# Patient Record
Sex: Female | Born: 1940 | Race: Black or African American | Hispanic: No | Marital: Single | Smoking: Never smoker
Health system: Southern US, Community
[De-identification: ages and names within clinical notes are randomized; demographics above are authoritative.]

## PROBLEM LIST (undated history)

## (undated) DIAGNOSIS — M1990 Unspecified osteoarthritis, unspecified site: Secondary | ICD-10-CM

## (undated) DIAGNOSIS — I2510 Atherosclerotic heart disease of native coronary artery without angina pectoris: Secondary | ICD-10-CM

## (undated) DIAGNOSIS — I25118 Atherosclerotic heart disease of native coronary artery with other forms of angina pectoris: Secondary | ICD-10-CM

## (undated) DIAGNOSIS — F329 Major depressive disorder, single episode, unspecified: Secondary | ICD-10-CM

## (undated) DIAGNOSIS — R0602 Shortness of breath: Secondary | ICD-10-CM

## (undated) DIAGNOSIS — J45909 Unspecified asthma, uncomplicated: Secondary | ICD-10-CM

## (undated) DIAGNOSIS — J449 Chronic obstructive pulmonary disease, unspecified: Secondary | ICD-10-CM

## (undated) DIAGNOSIS — D649 Anemia, unspecified: Secondary | ICD-10-CM

## (undated) DIAGNOSIS — E119 Type 2 diabetes mellitus without complications: Secondary | ICD-10-CM

## (undated) DIAGNOSIS — R1013 Epigastric pain: Secondary | ICD-10-CM

## (undated) DIAGNOSIS — K219 Gastro-esophageal reflux disease without esophagitis: Secondary | ICD-10-CM

## (undated) DIAGNOSIS — F32A Depression, unspecified: Secondary | ICD-10-CM

## (undated) DIAGNOSIS — I10 Essential (primary) hypertension: Secondary | ICD-10-CM

## (undated) DIAGNOSIS — Z8719 Personal history of other diseases of the digestive system: Secondary | ICD-10-CM

## (undated) DIAGNOSIS — G4730 Sleep apnea, unspecified: Secondary | ICD-10-CM

## (undated) DIAGNOSIS — I209 Angina pectoris, unspecified: Secondary | ICD-10-CM

## (undated) HISTORY — DX: Angina pectoris, unspecified: I20.9

## (undated) HISTORY — DX: Chronic obstructive pulmonary disease, unspecified: J44.9

## (undated) HISTORY — PX: TUBAL LIGATION: SHX77

## (undated) HISTORY — DX: Epigastric pain: R10.13

## (undated) HISTORY — DX: Depression, unspecified: F32.A

## (undated) HISTORY — DX: Anemia, unspecified: D64.9

## (undated) HISTORY — DX: Major depressive disorder, single episode, unspecified: F32.9

## (undated) HISTORY — PX: ABDOMINAL HYSTERECTOMY: SHX81

## (undated) HISTORY — DX: Atherosclerotic heart disease of native coronary artery with other forms of angina pectoris: I25.118

---

## 1980-03-15 HISTORY — PX: PARTIAL COLECTOMY: SHX5273

## 2012-07-04 ENCOUNTER — Ambulatory Visit (HOSPITAL_COMMUNITY)
Admission: RE | Admit: 2012-07-04 | Discharge: 2012-07-05 | Disposition: A | Discharge status: Home / Self Care | Payer: PRIVATE HEALTH INSURANCE | Source: Ambulatory Visit | Attending: Cardiology | Admitting: Cardiology

## 2012-07-04 ENCOUNTER — Encounter (HOSPITAL_COMMUNITY): Payer: Self-pay | Admitting: General Practice

## 2012-07-04 ENCOUNTER — Encounter (HOSPITAL_COMMUNITY)
Admission: RE | Disposition: A | Discharge status: Home / Self Care | Payer: Self-pay | Source: Ambulatory Visit | Attending: Cardiology

## 2012-07-04 DIAGNOSIS — Z9861 Coronary angioplasty status: Secondary | ICD-10-CM

## 2012-07-04 DIAGNOSIS — I2510 Atherosclerotic heart disease of native coronary artery without angina pectoris: Secondary | ICD-10-CM | POA: Insufficient documentation

## 2012-07-04 DIAGNOSIS — E876 Hypokalemia: Secondary | ICD-10-CM | POA: Insufficient documentation

## 2012-07-04 DIAGNOSIS — R0609 Other forms of dyspnea: Secondary | ICD-10-CM | POA: Insufficient documentation

## 2012-07-04 DIAGNOSIS — I10 Essential (primary) hypertension: Secondary | ICD-10-CM | POA: Insufficient documentation

## 2012-07-04 DIAGNOSIS — E7800 Pure hypercholesterolemia, unspecified: Secondary | ICD-10-CM

## 2012-07-04 DIAGNOSIS — R0989 Other specified symptoms and signs involving the circulatory and respiratory systems: Secondary | ICD-10-CM | POA: Insufficient documentation

## 2012-07-04 DIAGNOSIS — E785 Hyperlipidemia, unspecified: Secondary | ICD-10-CM | POA: Insufficient documentation

## 2012-07-04 DIAGNOSIS — Z955 Presence of coronary angioplasty implant and graft: Secondary | ICD-10-CM

## 2012-07-04 DIAGNOSIS — I2789 Other specified pulmonary heart diseases: Secondary | ICD-10-CM | POA: Insufficient documentation

## 2012-07-04 HISTORY — DX: Unspecified osteoarthritis, unspecified site: M19.90

## 2012-07-04 HISTORY — DX: Essential (primary) hypertension: I10

## 2012-07-04 HISTORY — DX: Type 2 diabetes mellitus without complications: E11.9

## 2012-07-04 HISTORY — DX: Sleep apnea, unspecified: G47.30

## 2012-07-04 HISTORY — DX: Atherosclerotic heart disease of native coronary artery without angina pectoris: I25.10

## 2012-07-04 HISTORY — DX: Shortness of breath: R06.02

## 2012-07-04 HISTORY — PX: LEFT AND RIGHT HEART CATHETERIZATION WITH CORONARY ANGIOGRAM: SHX5449

## 2012-07-04 HISTORY — DX: Personal history of other diseases of the digestive system: Z87.19

## 2012-07-04 HISTORY — DX: Gastro-esophageal reflux disease without esophagitis: K21.9

## 2012-07-04 HISTORY — PX: CORONARY ANGIOPLASTY WITH STENT PLACEMENT: SHX49

## 2012-07-04 HISTORY — PX: PERCUTANEOUS CORONARY STENT INTERVENTION (PCI-S): SHX5485

## 2012-07-04 HISTORY — DX: Unspecified asthma, uncomplicated: J45.909

## 2012-07-04 LAB — GLUCOSE, CAPILLARY
Glucose-Capillary: 119 mg/dL — ABNORMAL HIGH (ref 70–99)
Glucose-Capillary: 91 mg/dL (ref 70–99)
Glucose-Capillary: 124 mg/dL — ABNORMAL HIGH (ref 70–99)

## 2012-07-04 LAB — POCT I-STAT 3, VENOUS BLOOD GAS (G3P V)
Bicarbonate: 29.2 mEq/L — ABNORMAL HIGH (ref 20.0–24.0)
pH, Ven: 7.376 — ABNORMAL HIGH (ref 7.250–7.300)
pO2, Ven: 37 mmHg (ref 30.0–45.0)

## 2012-07-04 SURGERY — LEFT AND RIGHT HEART CATHETERIZATION WITH CORONARY ANGIOGRAM
Anesthesia: LOCAL

## 2012-07-04 MED ORDER — TICAGRELOR 90 MG PO TABS
90.0000 mg | ORAL_TABLET | Freq: Two times a day (BID) | ORAL | Status: DC
  Administered 2012-07-04: 90 mg via ORAL
  Filled 2012-07-04 (×3): qty 1

## 2012-07-04 MED ORDER — SODIUM CHLORIDE 0.9 % IV SOLN: 1.0000 mL/kg/h | INTRAVENOUS | Status: AC

## 2012-07-04 MED ORDER — SODIUM CHLORIDE 0.9 % IV SOLN
INTRAVENOUS | Status: DC
  Administered 2012-07-04: 150 mL/h via INTRAVENOUS

## 2012-07-04 MED ORDER — AMLODIPINE BESYLATE-VALSARTAN 10-320 MG PO TABS: 1.0000 | ORAL_TABLET | Freq: Every morning | ORAL | Status: DC

## 2012-07-04 MED ORDER — GABAPENTIN 300 MG PO CAPS
300.0000 mg | ORAL_CAPSULE | Freq: Every day | ORAL | Status: DC
  Administered 2012-07-04: 300 mg via ORAL
  Filled 2012-07-04 (×2): qty 1

## 2012-07-04 MED ORDER — SODIUM CHLORIDE 0.9 % IJ SOLN: 3.0000 mL | Freq: Two times a day (BID) | INTRAMUSCULAR | Status: DC

## 2012-07-04 MED ORDER — ASPIRIN 81 MG PO CHEW
324.0000 mg | CHEWABLE_TABLET | ORAL | Status: AC
  Administered 2012-07-04: 324 mg via ORAL
  Filled 2012-07-04: qty 4

## 2012-07-04 MED ORDER — PANTOPRAZOLE SODIUM 40 MG PO TBEC
40.0000 mg | DELAYED_RELEASE_TABLET | Freq: Every day | ORAL | Status: DC
  Administered 2012-07-04: 40 mg via ORAL
  Filled 2012-07-04: qty 1

## 2012-07-04 MED ORDER — SODIUM CHLORIDE 0.9 % IJ SOLN: 3.0000 mL | INTRAMUSCULAR | Status: DC | PRN

## 2012-07-04 MED ORDER — SODIUM CHLORIDE 0.9 % IV SOLN
INTRAVENOUS | Status: AC
  Filled 2012-07-04: qty 100

## 2012-07-04 MED ORDER — ALBUTEROL SULFATE HFA 108 (90 BASE) MCG/ACT IN AERS
2.0000 | INHALATION_SPRAY | Freq: Two times a day (BID) | RESPIRATORY_TRACT | Status: DC | PRN
  Administered 2012-07-04 – 2012-07-05 (×3): 2 via RESPIRATORY_TRACT
  Filled 2012-07-04: qty 6.7

## 2012-07-04 MED ORDER — VERAPAMIL HCL 2.5 MG/ML IV SOLN
INTRAVENOUS | Status: AC
  Filled 2012-07-04: qty 2

## 2012-07-04 MED ORDER — HEPARIN (PORCINE) IN NACL 2-0.9 UNIT/ML-% IJ SOLN
INTRAMUSCULAR | Status: AC
  Filled 2012-07-04: qty 1000

## 2012-07-04 MED ORDER — DOPAMINE-DEXTROSE 3.2-5 MG/ML-% IV SOLN
INTRAVENOUS | Status: AC
Start: 2012-07-04 — End: 2012-07-04
  Filled 2012-07-04: qty 250

## 2012-07-04 MED ORDER — ACETAMINOPHEN 325 MG PO TABS
650.0000 mg | ORAL_TABLET | ORAL | Status: DC | PRN
  Administered 2012-07-04: 17:00:00 650 mg via ORAL
  Filled 2012-07-04: qty 2

## 2012-07-04 MED ORDER — ASPIRIN 81 MG PO CHEW
81.0000 mg | CHEWABLE_TABLET | Freq: Every day | ORAL | Status: DC
  Administered 2012-07-05: 10:00:00 81 mg via ORAL
  Filled 2012-07-04: qty 1

## 2012-07-04 MED ORDER — ATORVASTATIN CALCIUM 80 MG PO TABS
80.0000 mg | ORAL_TABLET | Freq: Every day | ORAL | Status: DC
  Administered 2012-07-04: 80 mg via ORAL
  Filled 2012-07-04 (×2): qty 1

## 2012-07-04 MED ORDER — VALSARTAN 160 MG PO TABS
320.0000 mg | ORAL_TABLET | Freq: Every day | ORAL | Status: DC
  Administered 2012-07-04 – 2012-07-05 (×2): 320 mg via ORAL
  Filled 2012-07-04 (×2): qty 2

## 2012-07-04 MED ORDER — BIVALIRUDIN 250 MG IV SOLR
INTRAVENOUS | Status: AC
  Filled 2012-07-04: qty 250

## 2012-07-04 MED ORDER — NEBIVOLOL HCL 10 MG PO TABS
10.0000 mg | ORAL_TABLET | Freq: Every morning | ORAL | Status: DC
Start: 2012-07-04 — End: 2012-07-05
  Administered 2012-07-04 – 2012-07-05 (×2): 10 mg via ORAL
  Filled 2012-07-04 (×2): qty 1

## 2012-07-04 MED ORDER — ONDANSETRON HCL 4 MG/2ML IJ SOLN
INTRAMUSCULAR | Status: AC
  Filled 2012-07-04: qty 2

## 2012-07-04 MED ORDER — AMLODIPINE BESYLATE 10 MG PO TABS
10.0000 mg | ORAL_TABLET | Freq: Every day | ORAL | Status: DC
  Administered 2012-07-04 – 2012-07-05 (×2): 10 mg via ORAL
  Filled 2012-07-04 (×2): qty 1

## 2012-07-04 MED ORDER — TICAGRELOR 90 MG PO TABS
ORAL_TABLET | ORAL | Status: AC
  Administered 2012-07-05: 10:00:00 90 mg via ORAL
  Filled 2012-07-04: qty 2

## 2012-07-04 MED ORDER — BIVALIRUDIN BOLUS VIA INFUSION
0.1000 mg/kg | Freq: Once | INTRAVENOUS | Status: DC
  Filled 2012-07-04: qty 13

## 2012-07-04 MED ORDER — HYDROMORPHONE HCL PF 2 MG/ML IJ SOLN
INTRAMUSCULAR | Status: AC
  Filled 2012-07-04: qty 1

## 2012-07-04 MED ORDER — SODIUM CHLORIDE 0.9 % IV SOLN
0.2500 mg/kg/h | INTRAVENOUS | Status: AC
  Administered 2012-07-04: 0.25 mg/kg/h via INTRAVENOUS
  Filled 2012-07-04: qty 250

## 2012-07-04 MED ORDER — MIDAZOLAM HCL 2 MG/2ML IJ SOLN
INTRAMUSCULAR | Status: AC
  Filled 2012-07-04: qty 2

## 2012-07-04 MED ORDER — NITROGLYCERIN 1 MG/10 ML FOR IR/CATH LAB
INTRA_ARTERIAL | Status: AC
  Filled 2012-07-04: qty 10

## 2012-07-04 MED ORDER — SODIUM CHLORIDE 0.9 % IV SOLN: 250.0000 mL | INTRAVENOUS | Status: DC | PRN

## 2012-07-04 MED ORDER — LIDOCAINE HCL (PF) 1 % IJ SOLN
INTRAMUSCULAR | Status: AC
  Filled 2012-07-04: qty 30

## 2012-07-04 MED ORDER — ONDANSETRON HCL 4 MG/2ML IJ SOLN: 4.0000 mg | Freq: Four times a day (QID) | INTRAMUSCULAR | Status: DC | PRN

## 2012-07-04 NOTE — Progress Notes (Signed)
TR BAND REMOVAL  LOCATION:    right radial  DEFLATED PER PROTOCOL:    yes  TIME BAND OFF / DRESSING APPLIED:    1400   SITE UPON ARRIVAL:    Level 0  SITE AFTER BAND REMOVAL:    Level 0  REVERSE ALLEN'S TEST:     positive  CIRCULATION SENSATION AND MOVEMENT:    Within Normal Limits   yes  COMMENTS:   Tolerated procedure well 

## 2012-07-04 NOTE — Interval H&P Note (Signed)
History and Physical Interval Note:  07/04/2012 8:31 AM  Angie Hill  has presented today for surgery, with the diagnosis of cp  The various methods of treatment have been discussed with the patient and family. After consideration of risks, benefits and other options for treatment, the patient has consented to  Procedure(s): LEFT AND RIGHT HEART CATHETERIZATION WITH CORONARY ANGIOGRAM (N/A) and possible angioplasty as a surgical intervention .  The patient's history has been reviewed, patient examined, no change in status, stable for surgery.  I have reviewed the patient's chart and labs.  Questions were answered to the patient's satisfaction.     Pamella Pert

## 2012-07-04 NOTE — H&P (Signed)
  Please see office visit notes for complete details of HPI.  

## 2012-07-04 NOTE — Progress Notes (Signed)
Site area: right brachial  Site Prior to Removal:  Level 0  Pressure Applied For 10 MINUTES    Minutes Beginning at 1350  Manual:   yes  Patient Status During Pull:  AAO x 4  Post Pull Brachial  Site:  Level 0  Post Pull Instructions Given:  yes  Post Pull Pulses Present:  yes  Dressing Applied:  yes  Comments:  Tolerated procedure well

## 2012-07-04 NOTE — CV Procedure (Addendum)
Procedure performed: Procedures performed: Right and left heart catheterization and occlusion of cardiac output and cardiac index by Fick. Right radial access and antecubital vein access was utilized for performing the procedure. PTCA and stenting of the mid LAD with implantation of a 4.0 x 22 mm integrity non-drug-eluting stent for a high-grade 90% stenosis.  Indication: Patient is a 72 year-old female with history of hypertension,  hyperlipidemia, who presents with dyspnea. Patient has  had non invasive testing which was abnormal revealing a moderate size inferior and inferolateral wall scar with mild peri-infarct ischemia. Due to her worsening symptoms, brought to the cardiac catheterization lab to evaluate  coronary anatomy for definitive diagnosis of CAD. Right heart catheterization was performed to evaluate for pulmonary hypertension.  Procedural data:  RA pressure 12/8  Mean 10 mm mercury. RA saturation.  RV pressure 32/10 and Right ventricular EDP 11 mm Hg. PA pressure 38/8 with a mean of 23  mm mercury. PA saturation 68%.  Pulmonary capillary wedge 11/10 with a mean of 8 mm Hg. Aortic saturation 68%.  Cardiac output was 5.68 with cardiac index of 2.47  by Fick. .   Left ventricular pressure was 87/0 with LVEDP of 2 mm mercury. Aortic pressure was 75/45 with a mean of 54 mm mercury. There was no pressure gradient across the aortic valve.   Left ventricle: Performed in the RAO projection revealed LVEF of 55-60%. There was no significant MR. no wall motion abnormality.  Right coronary artery: The vessel is dominant. It is diffusely diseased with midsegment showing a 20-30% followed by a tandem 60-70% stenosis. Gives origin to a moderate size PDA.  Left main coronary artery is large and  calcified without luminal obstruction.  Circumflex coronary artery: A large vessel giving origin to a large obtuse marginal 1. The proximal circumflex coronary artery shows mild ectasia in the proximal  segment. The ostium shows a 20-30% stenosis. There is mild diffuse luminal irregularity in the midsegment. Gives origin to secondary obtuse marginal branches via large OM 1. There is a moderate size AV groove branch. Ostium of which has a 30-30% stenosis.  LAD:  LAD gives origin to a large diagonal-1. Midsegment of the LAD shows a high-grade 90% stenosis followed by post stenotic ectasia LAD has mild diffuse luminal irregularities. Distal LAD shows a focal 80-90% stenosis at the apex.  Impression: Coronary artery disease with a high-grade mid LAD 90% stenosis followed by post stenotic ectasia and a 60 at most 60-70% stenosis in the mid RCA which does not appear to be critical. This will be managed medically unless she has recurrent chest pain then she'll be brought back for possible angioplasty.  Interventional data: Successful PTCA and stenting of the mid LAD with implantation of a 4.0 x 22 mm integrity Non-DES.  Will need Dual antiplatelet therapy with BRILINTA and ASA 81 mg for at least 3 months followed by aspirin indefinitely.   Technique of right cardiac catheterization: A 5 French right AC vein sheath introduced into  vein access. A 5 French Swan-Ganz catheter was advanced with balloon inflated on the sheath under fluoroscopic guidance into first the right atrium followed by the right ventricle and into the pulmonary artery to pulmonary artery wedge position. Hemodynamics were obtained in a locations.  After hemodynamics were completed, samples were taken for SaO2% measurement to be used in [facility] /Index catheterization.  The catheter was then pulled back and then completely out of the body.   Left Heart Catheterization   First a  5 [place] TIG 4 catheter was advanced over standard J-wire into the ascending aorta and used to engage first the Left and Right Coronary Artery. Multiple cineangiographic views of the Left then Right Coronary Artery system(s) were performed. Same  catheter which was used  to cross the aortic valve for measurement Left Ventricular Hemodynamics. Left ventriculography was then performed in the RAO projection. Hemodynamics were then resampled and the catheter pulled back across the aortic valve for measurement of "pullback" gradient. The catheter was then removed the body over wire. All exchanges were made over standard J wire.   Technique of intervention:  Using a 6 [place] Ikari left 3.5 guide catheter the LM  coronary  was selected and cannulated. Using Angiomax for anticoagulation, I utilized a Runthrough guidewire and across the LAD coronary artery without any difficulty. I placed the tip of the wire into the distal  coronary artery. Angiography was performed.   Then I utilized a 3.5 x 15 mm emerge balloon , I performed balloon angioplasty at 12 atmospheric pressure x 1 for 40 seconds each. I proceeded with implantation of a 4.0x22 mm Integrity Non-drug-eluting stent into the mid LAD coronary artery. The stent was deployed at 12 atmospheric pressure for 45  seconds. The stent was then post dilated with a 4.0x12 mm [place] Quantum balloon x3 at 16, pop and 14 atmospheric pressure for 30,12, 12 seconds. Post-balloon angioplasty results were excellent with 0% residual stenoses and TIMI-3 flow was maintained. There was no evidence of edge dissection. The guidewire was withdrawn out of the body and the guide catheter was engaged and pulled out of the body over the J-wire the was no immediate complication. Patient blood pressure was well maintained during right heart catheterization, however after administration of verapamil, she did drop her blood pressure down, and I transiently started her on dopamine which was turned off within a few minutes after starting the case and her blood pressure and hemodynamics remained stable throughout the procedure. Patient tolerated the procedure well.

## 2012-07-05 DIAGNOSIS — E7800 Pure hypercholesterolemia, unspecified: Secondary | ICD-10-CM

## 2012-07-05 DIAGNOSIS — I10 Essential (primary) hypertension: Secondary | ICD-10-CM | POA: Insufficient documentation

## 2012-07-05 DIAGNOSIS — E785 Hyperlipidemia, unspecified: Secondary | ICD-10-CM

## 2012-07-05 DIAGNOSIS — Z9861 Coronary angioplasty status: Secondary | ICD-10-CM

## 2012-07-05 HISTORY — DX: Coronary angioplasty status: Z98.61

## 2012-07-05 HISTORY — DX: Pure hypercholesterolemia, unspecified: E78.00

## 2012-07-05 HISTORY — DX: Essential (primary) hypertension: I10

## 2012-07-05 LAB — CBC
MCV: 78.7 fL (ref 78.0–100.0)
Platelets: 301 10*3/uL (ref 150–400)
RBC: 4.74 MIL/uL (ref 3.87–5.11)
WBC: 7.7 10*3/uL (ref 4.0–10.5)

## 2012-07-05 LAB — BASIC METABOLIC PANEL
CO2: 30 mEq/L (ref 19–32)
Chloride: 105 mEq/L (ref 96–112)
Creatinine, Ser: 0.77 mg/dL (ref 0.50–1.10)

## 2012-07-05 LAB — GLUCOSE, CAPILLARY: Glucose-Capillary: 109 mg/dL — ABNORMAL HIGH (ref 70–99)

## 2012-07-05 MED ORDER — TICAGRELOR 90 MG PO TABS: 90.0000 mg | ORAL_TABLET | Freq: Two times a day (BID) | ORAL | Status: DC

## 2012-07-05 MED ORDER — NEBIVOLOL HCL 20 MG PO TABS: 10.0000 mg | ORAL_TABLET | Freq: Every morning | ORAL | Status: DC

## 2012-07-05 MED ORDER — NITROGLYCERIN 0.4 MG SL SUBL: 0.4000 mg | SUBLINGUAL_TABLET | SUBLINGUAL | Status: DC | PRN

## 2012-07-05 MED FILL — Dextrose Inj 5%: INTRAVENOUS | Qty: 1000 | Status: AC

## 2012-07-05 NOTE — Progress Notes (Signed)
CARDIAC REHAB PHASE I   PRE:  Rate/Rhythm: 72 SR asleep    BP: sitting 174/97    SaO2:   MODE:  Ambulation: 600 ft   POST:  Rate/Rhythm: 104 ST    BP: sitting 164/74     SaO2:   Pt feels much improved. No angina/SOB. Pt very happy about results and feels she has a new lease on life. BP decreased from pre to post walk. Ed completed. Pt had questions re DM, sts she does not know if she is or not. Discussed watching diet and f/u with PCP in May. Very receptive. Interested in [facility] and requests  Her name be sent to [facility]. [phone]   [name] CES, ACSM 07/05/2012 10:19 AM

## 2012-07-05 NOTE — Discharge Summary (Addendum)
Physician Discharge Summary  Patient ID: Angie Hill MRN: [id number] DOB/AGE: 07-28-1940 72 y.o.  Admit date: 07/04/2012 Discharge date: 07/05/2012  Primary Discharge Diagnosis CAD of native vessel  Secondary Discharge Diagnosis Hypertension Hyperlipidemia Shortness of breath   Significant Diagnostic Studies: 07/04/12: Right and left heart catheterization and occlusion of cardiac output and cardiac index by Fick. Right radial access and antecubital vein access was utilized for performing the procedure. PTCA and stenting of the mid LAD with implantation of a 4.0 x 22 mm integrity non-drug-eluting stent for a high-grade 90% stenosis. Mild pulmonary hypertension with preserved COP and CI.   Hospital Course:  Patient admitted an elective fashion for coronary angiography after she had outpatient stress test which was abnormal revealing inferior wall scar with moderate peri-infarct ischemia. Coronary angiography revealed a very high-grade stenosis in the mid LAD and moderate disease in the RCA. It was felt that RCA can be managed medically. She underwent successful angioplasty and remained asymptomatic the following day. Her blood pressure was still mildly elevated, however distal bed rest in the outpatient setting once she is more stable. She did have mild hypokalemia, however patient is on Diovan and contrast diuresis may have led to hyperkalemia as preprocedural labs were within normal limits. Hence she was felt stable for discharge with outpatient followup. I have increased bystolic to 20 mg by mouth daily.   Recommendations on discharge:  Patient will be treated with aspirin indefinitely and will need BRILINTA for at least 3 months. Unless she has recurrence of chest discomfort or worsening dyspnea, then we will consider angioplasty to the right coronary artery, although the stress test was abnormal revealing inferior wall ischemia the coronary angiography does not appear to show high-grade  stenosis in the RCA.  Discharge Exam: Blood pressure 172/86, pulse 78, temperature 97.6 F (36.4 C), temperature source Oral, resp. rate 18, height 5\' 7"  (1.702 m), weight 123.7 kg (272 lb 11.3 oz), SpO2 98.00%.    General appearance: alert, cooperative, appears stated age, no distress and moderately obese Resp: clear to auscultation bilaterally Cardio: regular rate and rhythm, S1, S2 normal, no murmur, click, rub or gallop GI: soft, non-tender; bowel sounds normal; no masses,  no organomegaly and pannus present Extremities: extremities normal, atraumatic, no cyanosis or edema Right radial access site good. Labs:   Lab Results  Component Value Date   WBC 7.7 07/05/2012   HGB 12.3 07/05/2012   HCT 37.3 07/05/2012   MCV 78.7 07/05/2012   PLT 301 07/05/2012    Recent Labs Lab 07/05/12 0430  NA 144  K 3.2*  CL 105  CO2 30  BUN 12  CREATININE 0.77  CALCIUM 9.3  GLUCOSE 107*   No results found for this basename: CKTOTAL, CKMB, CKMBINDEX, TROPONINI    Lipid Panel  No results found for this basename: chol, trig, hdl, cholhdl, vldl, ldlcalc    EKG: normal EKG, normal sinus rhythm, unchanged from previous tracings.  FOLLOW UP PLANS AND APPOINTMENTS    Medication List    TAKE these medications       albuterol 108 (90 BASE) MCG/ACT inhaler  Commonly known as:  PROVENTIL HFA;VENTOLIN HFA  Inhale 2 puffs into the lungs 2 (two) times daily as needed for wheezing or shortness of breath.     amLODipine-valsartan 10-320 MG per tablet  Commonly known as:  EXFORGE  Take 1 tablet by mouth every morning.     atorvastatin 80 MG tablet  Commonly known as:  LIPITOR  Take 80  mg by mouth daily.     fluocinonide cream 0.05 %  Commonly known as:  LIDEX  Apply 1 application topically 2 (two) times daily. Applied to rash     gabapentin 300 MG capsule  Commonly known as:  NEURONTIN  Take 300 mg by mouth at bedtime.     Nebivolol HCl 20 MG Tabs  Take 0.5 tablets (10 mg total) by  mouth every morning.     nitroGLYCERIN 0.4 MG SL tablet  Commonly known as:  NITROSTAT  Place 1 tablet (0.4 mg total) under the tongue every 5 (five) minutes as needed for chest pain.     omeprazole 20 MG capsule  Commonly known as:  PRILOSEC  Take 20 mg by mouth daily.     Ticagrelor 90 MG Tabs tablet  Commonly known as:  BRILINTA  Take 1 tablet (90 mg total) by mouth 2 (two) times daily.           Follow-up Information   Follow up with Pamella Pert, MD. (Keep previous set appointment)    Contact information:   [address] [place] [address] [phone]        Pamella Pert, MD 07/05/2012, 8:57 AM  Pager: [phone] Office: [phone] If no answer: [phone]

## 2012-07-06 NOTE — Progress Notes (Signed)
Patient's copay $3.60/30 day supply - no prior auth req'd - can use any drugstore. Hershal Eriksson J. Lucretia Roers, RN, BSN, [employer] [phone]

## 2012-09-01 ENCOUNTER — Ambulatory Visit (INDEPENDENT_AMBULATORY_CARE_PROVIDER_SITE_OTHER): Payer: 59 | Admitting: Podiatry

## 2012-09-01 VITALS — BP 160/90 | HR 64

## 2012-09-01 DIAGNOSIS — G609 Hereditary and idiopathic neuropathy, unspecified: Secondary | ICD-10-CM

## 2012-09-01 DIAGNOSIS — M25579 Pain in unspecified ankle and joints of unspecified foot: Secondary | ICD-10-CM

## 2012-09-01 DIAGNOSIS — B351 Tinea unguium: Secondary | ICD-10-CM

## 2012-09-01 DIAGNOSIS — L259 Unspecified contact dermatitis, unspecified cause: Secondary | ICD-10-CM

## 2012-09-01 DIAGNOSIS — L309 Dermatitis, unspecified: Secondary | ICD-10-CM

## 2012-09-01 HISTORY — DX: Pain in unspecified ankle and joints of unspecified foot: M25.579

## 2012-09-01 HISTORY — DX: Tinea unguium: B35.1

## 2012-09-01 HISTORY — DX: Hereditary and idiopathic neuropathy, unspecified: G60.9

## 2012-09-01 HISTORY — DX: Dermatitis, unspecified: L30.9

## 2012-09-01 NOTE — Progress Notes (Signed)
   SUBJECTIVE: 72 y.o. year old female presents for diabetic foot care. Patient is ambulatory without assistance. Patient is referred by Dr. Julio Sicks.  Stated that her foot has been Itching and burning on top where skin has erupted and crusted to black patches on dorsum of both feet and back heel cord area right ankle. Stated that she had this problem for past 5 years.  She was diagnosed for borderline diabetes a year ago.  REVIEW OF SYSTEMS: Constitutional: negative for anorexia, fatigue, fevers, night sweats and weight loss Eyes: negative Ears, nose, mouth, throat, and face: negative Respiratory: Occasional difficulty breathing early in the morning and being treated. Cardiovascular: Had stent put in last April 2014. Gastrointestinal: negative Musculoskeletal:negative  OBJECTIVE: DERMATOLOGIC EXAMINATION: Nails: Thick and hypertrophic x 10. Callus painful under 4th MPJ right foot. Hyperpigmented dry thick patches of old erupted skin dorsum bilateral, and right posterior heel cord area. VASCULAR EXAMINATION OF LOWER LIMBS: Pedal pulses: All pedal pulses are palpable on dorsalis pedis and not palpable on posterior tibial.  Capillary Filling times within 3 seconds in all digits.   NEUROLOGIC EXAMINATION OF THE LOWER LIMBS: Achilles DTR is present and within normal. Monofilament (Semmes-Weinstein 10-gm) sensory testing positive 6 out of 6, bilateral. Vibratory sensations(128Hz  turning fork) has decreased on forefoot right, and left foot is intact at medial and lateral forefoot.  Sharp and Dull discriminatory sensations at the plantar ball of hallux is intact bilateral.   MUSCULOSKELETAL EXAMINATION: No gross deformities bilateral.   ASSESSMENT: 1. Idiopathic dermatitis chronic both feet. 2. Peripheral neuropathy right foot. 3. Borderline Diabetes. 4. Deformed toe nails x 10. 5. Peripheral neuropathy.  PLAN: Reviewed clinical findings. Discussed how to care for the skin lesions  when gets erupted using hydrating lotion, antibiotic cream, cortisone cream etc. All nails debrided. Callus under 4th MPJ plantar right debrided. Return in 3 months or as needed.

## 2012-10-06 ENCOUNTER — Encounter (HOSPITAL_COMMUNITY): Payer: Self-pay | Admitting: Pharmacy Technician

## 2012-10-17 ENCOUNTER — Encounter (HOSPITAL_COMMUNITY)
Admission: RE | Disposition: A | Discharge status: Home / Self Care | Payer: Self-pay | Source: Ambulatory Visit | Attending: Cardiology

## 2012-10-17 ENCOUNTER — Ambulatory Visit (HOSPITAL_COMMUNITY)
Admission: RE | Admit: 2012-10-17 | Discharge: 2012-10-17 | Disposition: A | Discharge status: Home / Self Care | Payer: PRIVATE HEALTH INSURANCE | Source: Ambulatory Visit | Attending: Cardiology | Admitting: Cardiology

## 2012-10-17 DIAGNOSIS — Z823 Family history of stroke: Secondary | ICD-10-CM | POA: Insufficient documentation

## 2012-10-17 DIAGNOSIS — Z7982 Long term (current) use of aspirin: Secondary | ICD-10-CM | POA: Insufficient documentation

## 2012-10-17 DIAGNOSIS — I209 Angina pectoris, unspecified: Secondary | ICD-10-CM | POA: Insufficient documentation

## 2012-10-17 DIAGNOSIS — I10 Essential (primary) hypertension: Secondary | ICD-10-CM | POA: Insufficient documentation

## 2012-10-17 DIAGNOSIS — M129 Arthropathy, unspecified: Secondary | ICD-10-CM | POA: Insufficient documentation

## 2012-10-17 DIAGNOSIS — E119 Type 2 diabetes mellitus without complications: Secondary | ICD-10-CM | POA: Insufficient documentation

## 2012-10-17 DIAGNOSIS — Z9861 Coronary angioplasty status: Secondary | ICD-10-CM | POA: Insufficient documentation

## 2012-10-17 DIAGNOSIS — K219 Gastro-esophageal reflux disease without esophagitis: Secondary | ICD-10-CM | POA: Insufficient documentation

## 2012-10-17 DIAGNOSIS — E785 Hyperlipidemia, unspecified: Secondary | ICD-10-CM | POA: Insufficient documentation

## 2012-10-17 DIAGNOSIS — Z6841 Body Mass Index (BMI) 40.0 and over, adult: Secondary | ICD-10-CM | POA: Insufficient documentation

## 2012-10-17 DIAGNOSIS — I2510 Atherosclerotic heart disease of native coronary artery without angina pectoris: Secondary | ICD-10-CM | POA: Insufficient documentation

## 2012-10-17 DIAGNOSIS — Z8249 Family history of ischemic heart disease and other diseases of the circulatory system: Secondary | ICD-10-CM | POA: Insufficient documentation

## 2012-10-17 DIAGNOSIS — Z79899 Other long term (current) drug therapy: Secondary | ICD-10-CM | POA: Insufficient documentation

## 2012-10-17 DIAGNOSIS — J45909 Unspecified asthma, uncomplicated: Secondary | ICD-10-CM | POA: Insufficient documentation

## 2012-10-17 HISTORY — PX: LEFT HEART CATHETERIZATION WITH CORONARY ANGIOGRAM: SHX5451

## 2012-10-17 LAB — GLUCOSE, CAPILLARY: Glucose-Capillary: 101 mg/dL — ABNORMAL HIGH (ref 70–99)

## 2012-10-17 SURGERY — LEFT HEART CATHETERIZATION WITH CORONARY ANGIOGRAM
Anesthesia: LOCAL

## 2012-10-17 MED ORDER — SODIUM CHLORIDE 0.9 % IJ SOLN: 3.0000 mL | Freq: Two times a day (BID) | INTRAMUSCULAR | Status: DC

## 2012-10-17 MED ORDER — NITROGLYCERIN 0.2 MG/ML ON CALL CATH LAB
INTRAVENOUS | Status: AC
  Filled 2012-10-17: qty 1

## 2012-10-17 MED ORDER — HEPARIN SODIUM (PORCINE) 1000 UNIT/ML IJ SOLN
INTRAMUSCULAR | Status: AC
  Filled 2012-10-17: qty 1

## 2012-10-17 MED ORDER — MIDAZOLAM HCL 2 MG/2ML IJ SOLN
INTRAMUSCULAR | Status: AC
  Filled 2012-10-17: qty 2

## 2012-10-17 MED ORDER — HYDROMORPHONE HCL PF 2 MG/ML IJ SOLN
INTRAMUSCULAR | Status: AC
  Filled 2012-10-17: qty 1

## 2012-10-17 MED ORDER — BIVALIRUDIN 250 MG IV SOLR
INTRAVENOUS | Status: AC
  Filled 2012-10-17: qty 250

## 2012-10-17 MED ORDER — VERAPAMIL HCL 2.5 MG/ML IV SOLN
INTRAVENOUS | Status: AC
  Filled 2012-10-17: qty 2

## 2012-10-17 MED ORDER — ONDANSETRON HCL 4 MG/2ML IJ SOLN: 4.0000 mg | Freq: Four times a day (QID) | INTRAMUSCULAR | Status: DC | PRN

## 2012-10-17 MED ORDER — SODIUM CHLORIDE 0.9 % IV SOLN: 250.0000 mL | INTRAVENOUS | Status: DC | PRN

## 2012-10-17 MED ORDER — SODIUM CHLORIDE 0.9 % IV SOLN: 1.0000 mL/kg/h | INTRAVENOUS | Status: DC

## 2012-10-17 MED ORDER — HEPARIN (PORCINE) IN NACL 2-0.9 UNIT/ML-% IJ SOLN
INTRAMUSCULAR | Status: AC
  Filled 2012-10-17: qty 1000

## 2012-10-17 MED ORDER — ASPIRIN 81 MG PO CHEW
CHEWABLE_TABLET | ORAL | Status: AC
  Administered 2012-10-17: 324 mg via ORAL
  Filled 2012-10-17: qty 4

## 2012-10-17 MED ORDER — SODIUM CHLORIDE 0.9 % IV SOLN
INTRAVENOUS | Status: DC
  Administered 2012-10-17: 08:00:00 via INTRAVENOUS

## 2012-10-17 MED ORDER — ASPIRIN 81 MG PO CHEW: 324.0000 mg | CHEWABLE_TABLET | ORAL | Status: AC

## 2012-10-17 MED ORDER — HYDROCODONE-ACETAMINOPHEN 5-325 MG PO TABS
2.0000 | ORAL_TABLET | Freq: Once | ORAL | Status: AC
  Administered 2012-10-17: 2 via ORAL

## 2012-10-17 MED ORDER — ADENOSINE (DIAGNOSTIC) 3 MG/ML IV SOLN
20.0000 mL | Freq: Once | INTRAVENOUS | Status: DC
  Filled 2012-10-17: qty 20

## 2012-10-17 MED ORDER — SODIUM CHLORIDE 0.9 % IJ SOLN: 3.0000 mL | INTRAMUSCULAR | Status: DC | PRN

## 2012-10-17 MED ORDER — LIDOCAINE HCL (PF) 1 % IJ SOLN
INTRAMUSCULAR | Status: AC
  Filled 2012-10-17: qty 30

## 2012-10-17 MED ORDER — ACETAMINOPHEN 325 MG PO TABS: 650.0000 mg | ORAL_TABLET | ORAL | Status: DC | PRN

## 2012-10-17 NOTE — CV Procedure (Signed)
Procedure performed:  Left heart catheterization including hemodynamic monitoring of the left ventricle, LV gram, selective right and left coronary arteriography. Calculation of FFR by intravenous adenosine guided stress protocol by use of Prime wire.  Indication patient is a 72 year-old female with history of hypertension,  hyperlipidemia, CAD with history of proximal LAD stenting with 4.0 x 22 mm non-drug-eluting stent on 07/04/2012,  who presents with chest pain suggestive of angina pectoris, worsening shortness of breath, use of frequent sublingual nitroglycerin. Due to class III angina pectoris, patient is brought to the cardiac catheterization lab to evaluate the  coronary anatomy for definitive diagnosis of CAD.  Hemodynamic data:  Left ventricular pressure was 143/6 with LVEDP of 12 mm mercury. Aortic pressure was 153/79 with a mean of 111 mm mercury. There was no pressure gradient across the aortic valve  Left ventricle: Performed in the RAO projection revealed LVEF of 55-60%. There was no significant MR. no wall motion abnormality.   Right coronary artery: The vessel is dominant. It is diffusely diseased with midsegment showing a 20-30% followed by a tandem 40-50% stenosis in the mid segment. Gives origin to a moderate size PDA.   Left main coronary artery is large and calcified without luminal obstruction.   Circumflex coronary artery: A large vessel giving origin to a large obtuse marginal 1. The proximal circumflex coronary artery shows mild ectasia in the proximal segment. The ostium shows a 20-30% stenosis. There is mild diffuse luminal irregularity in the midsegment. Gives origin to secondary obtuse marginal branches via large OM 1. There is a moderate size AV groove branch. Ostium of which has a 30-30% stenosis.   LAD: LAD gives origin to a large diagonal-1. Midsegment of the LAD shows a 50-60% hazy new stenosis, mid to distal  LAD has mild diffuse luminal irregularities. Distal  LAD shows a focal 70% stenosis at the apex. Previously placed proximal LAD stent on 07/04/2012, 4.0x22 mm Integrity Non-drug-eluting stent is widely patent.   Discussions: Previously placed proximal LAD stent on 07/04/2012, 4.0x22 mm Integrity Non-drug-eluting stent is widely patent. There is a hazy mid LAD stenosis in the LAO cranial views appears to be significant, however in the RAO views, the lesion appears to be  utmost 40-50%. Given her symptoms of angina pectoris, I proceeded performed FFR of artery. FFR performed in the standard protocol, reveals it to be 0.86, hence the lesion was left alone and felt to be not hemodynamically significant.    Technique: Under sterile precautions using a 6 French right radial  arterial access, a 6 French sheath was introduced into the right radial artery. A 5 [place] Tig 4 catheter was advanced into the ascending aorta selective  right coronary artery and left coronary artery was cannulated and angiography was performed in multiple views. The catheter was pulled back Out of the body over exchange length J-wire.  Same Catheter was used to perform LV gram which was performed in LAO projection.  Catheter exchanged out of the body over J-Wire. NO immediate complications noted. Patient tolerated the procedure well.  I then proceeded with evaluation of FFR with the same diagnostic catheter. Using Angiomax for anticoagulation, keeping ACT greater than 200, I utilized Prime Wire in the standard fashion, administering adenosine through the intravenous route, procedure was performed and FFR was documented. The wire was withdrawn, angiography repeated and the diagnostic catheter was pulled out of the body over the J-wire. Hemostasis was obtained by applying TR band. Patient of this operative immediate complications.  Rec: Medical therapy with aggressive risk factor reduction.   Disposition: Will be discharged home today with outpatient follow up.

## 2012-10-17 NOTE — Interval H&P Note (Signed)
History and Physical Interval Note:  10/17/2012 8:13 AM  Angie Hill  has presented today for surgery, with the diagnosis of Chest pain  The various methods of treatment have been discussed with the patient and family. After consideration of risks, benefits and other options for treatment, the patient has consented to  Procedure(s): LEFT HEART CATHETERIZATION WITH CORONARY ANGIOGRAM (N/A) and possible PTCA as a surgical intervention .  The patient's history has been reviewed, patient examined, no change in status, stable for surgery.  I have reviewed the patient's chart and labs.  Questions were answered to the patient's satisfaction.    Cath Lab Visit (complete for each Cath Lab visit)  Clinical Evaluation Leading to the Procedure:   ACS: no  Non-ACS:    Anginal Classification: CCS III  Anti-ischemic medical therapy: Maximal Therapy (2 or more classes of medications)  Non-Invasive Test Results: No non-invasive testing performed  Prior CABG: No previous CABG       [facility]

## 2012-10-17 NOTE — H&P (Signed)
  See the written copy of this report in the patient's paper medical record.  These results did not interface directly into the electronic medical record and are summarized here.  

## 2012-10-18 MED FILL — Sodium Chloride IV Soln 0.9%: INTRAVENOUS | Qty: 50 | Status: AC

## 2012-12-01 ENCOUNTER — Ambulatory Visit: Payer: 59 | Admitting: Podiatry

## 2014-02-21 ENCOUNTER — Encounter (HOSPITAL_COMMUNITY): Payer: Self-pay | Admitting: Cardiology

## 2014-09-14 ENCOUNTER — Emergency Department (HOSPITAL_BASED_OUTPATIENT_CLINIC_OR_DEPARTMENT_OTHER)
Admission: EM | Admit: 2014-09-14 | Discharge: 2014-09-14 | Disposition: A | Discharge status: Home / Self Care | Payer: Medicare Other | Attending: Emergency Medicine | Admitting: Emergency Medicine

## 2014-09-14 ENCOUNTER — Encounter (HOSPITAL_BASED_OUTPATIENT_CLINIC_OR_DEPARTMENT_OTHER): Payer: Self-pay | Admitting: *Deleted

## 2014-09-14 ENCOUNTER — Emergency Department (HOSPITAL_BASED_OUTPATIENT_CLINIC_OR_DEPARTMENT_OTHER): Payer: Medicare Other

## 2014-09-14 DIAGNOSIS — Z9889 Other specified postprocedural states: Secondary | ICD-10-CM | POA: Insufficient documentation

## 2014-09-14 DIAGNOSIS — I2510 Atherosclerotic heart disease of native coronary artery without angina pectoris: Secondary | ICD-10-CM | POA: Diagnosis not present

## 2014-09-14 DIAGNOSIS — J189 Pneumonia, unspecified organism: Secondary | ICD-10-CM

## 2014-09-14 DIAGNOSIS — R1011 Right upper quadrant pain: Secondary | ICD-10-CM | POA: Diagnosis not present

## 2014-09-14 DIAGNOSIS — E119 Type 2 diabetes mellitus without complications: Secondary | ICD-10-CM | POA: Insufficient documentation

## 2014-09-14 DIAGNOSIS — J159 Unspecified bacterial pneumonia: Secondary | ICD-10-CM | POA: Diagnosis not present

## 2014-09-14 DIAGNOSIS — J45909 Unspecified asthma, uncomplicated: Secondary | ICD-10-CM | POA: Diagnosis not present

## 2014-09-14 DIAGNOSIS — Z8669 Personal history of other diseases of the nervous system and sense organs: Secondary | ICD-10-CM | POA: Insufficient documentation

## 2014-09-14 DIAGNOSIS — Z7982 Long term (current) use of aspirin: Secondary | ICD-10-CM | POA: Insufficient documentation

## 2014-09-14 DIAGNOSIS — Z79899 Other long term (current) drug therapy: Secondary | ICD-10-CM | POA: Insufficient documentation

## 2014-09-14 DIAGNOSIS — Z9861 Coronary angioplasty status: Secondary | ICD-10-CM | POA: Insufficient documentation

## 2014-09-14 DIAGNOSIS — K219 Gastro-esophageal reflux disease without esophagitis: Secondary | ICD-10-CM | POA: Diagnosis not present

## 2014-09-14 DIAGNOSIS — I10 Essential (primary) hypertension: Secondary | ICD-10-CM | POA: Diagnosis not present

## 2014-09-14 DIAGNOSIS — M1990 Unspecified osteoarthritis, unspecified site: Secondary | ICD-10-CM | POA: Diagnosis not present

## 2014-09-14 DIAGNOSIS — R079 Chest pain, unspecified: Secondary | ICD-10-CM | POA: Diagnosis present

## 2014-09-14 LAB — CBC WITH DIFFERENTIAL/PLATELET
BASOS ABS: 0 10*3/uL (ref 0.0–0.1)
Basophils Relative: 0 % (ref 0–1)
EOS PCT: 1 % (ref 0–5)
Eosinophils Absolute: 0.1 10*3/uL (ref 0.0–0.7)
HCT: 38.4 % (ref 36.0–46.0)
Hemoglobin: 12.1 g/dL (ref 12.0–15.0)
LYMPHS PCT: 37 % (ref 12–46)
Lymphs Abs: 3.4 10*3/uL (ref 0.7–4.0)
MCH: 26.2 pg (ref 26.0–34.0)
MCHC: 31.5 g/dL (ref 30.0–36.0)
MCV: 83.1 fL (ref 78.0–100.0)
Monocytes Absolute: 0.4 10*3/uL (ref 0.1–1.0)
Monocytes Relative: 5 % (ref 3–12)
NEUTROS PCT: 57 % (ref 43–77)
Neutro Abs: 5.2 10*3/uL (ref 1.7–7.7)
PLATELETS: 327 10*3/uL (ref 150–400)
RBC: 4.62 MIL/uL (ref 3.87–5.11)
RDW: 14 % (ref 11.5–15.5)
WBC: 9.1 10*3/uL (ref 4.0–10.5)

## 2014-09-14 LAB — COMPREHENSIVE METABOLIC PANEL
ALK PHOS: 36 U/L — AB (ref 38–126)
ALT: 16 U/L (ref 14–54)
ANION GAP: 10 (ref 5–15)
AST: 19 U/L (ref 15–41)
Albumin: 3.6 g/dL (ref 3.5–5.0)
BILIRUBIN TOTAL: 0.4 mg/dL (ref 0.3–1.2)
BUN: 15 mg/dL (ref 6–20)
CHLORIDE: 102 mmol/L (ref 101–111)
CO2: 29 mmol/L (ref 22–32)
Calcium: 9.5 mg/dL (ref 8.9–10.3)
Creatinine, Ser: 0.91 mg/dL (ref 0.44–1.00)
GFR calc Af Amer: >60 mL/min (ref >60)
GFR calc non Af Amer: >60 mL/min (ref >60)
Glucose, Bld: 99 mg/dL (ref 65–99)
Potassium: 3.3 mmol/L — ABNORMAL LOW (ref 3.5–5.1)
Sodium: 141 mmol/L (ref 135–145)
TOTAL PROTEIN: 7 g/dL (ref 6.5–8.1)

## 2014-09-14 LAB — LIPASE, BLOOD: Lipase: 13 U/L — ABNORMAL LOW (ref 22–51)

## 2014-09-14 LAB — I-STAT CG4 LACTIC ACID, ED: Lactic Acid, Venous: 1.04 mmol/L (ref 0.5–2.0)

## 2014-09-14 LAB — TROPONIN I: Troponin I: <0.03 ng/mL (ref <0.031)

## 2014-09-14 MED ORDER — AZITHROMYCIN 250 MG PO TABS
500.0000 mg | ORAL_TABLET | Freq: Once | ORAL | Status: AC
  Administered 2014-09-14: 500 mg via ORAL
  Filled 2014-09-14: qty 2

## 2014-09-14 MED ORDER — CEFTRIAXONE SODIUM 1 G IJ SOLR
INTRAMUSCULAR | Status: AC
Start: 2014-09-14 — End: 2014-09-14
  Filled 2014-09-14: qty 10

## 2014-09-14 MED ORDER — ONDANSETRON HCL 4 MG/2ML IJ SOLN
4.0000 mg | Freq: Once | INTRAMUSCULAR | Status: AC
  Administered 2014-09-14: 4 mg via INTRAVENOUS
  Filled 2014-09-14: qty 2

## 2014-09-14 MED ORDER — DEXTROSE 5 % IV SOLN
1.0000 g | Freq: Once | INTRAVENOUS | Status: AC
  Administered 2014-09-14: 1 g via INTRAVENOUS

## 2014-09-14 MED ORDER — AZITHROMYCIN 250 MG PO TABS: 250.0000 mg | ORAL_TABLET | Freq: Every day | ORAL | Status: DC

## 2014-09-14 NOTE — ED Notes (Signed)
Pt presents with chest pain  

## 2014-09-14 NOTE — ED Notes (Signed)
Pt placed on cont cardiac monitoring with cont POX monitoring as well

## 2014-09-14 NOTE — ED Notes (Signed)
Pt reports that she has had right sided CP when taking a deep breath x several days.  Reports nausea, SOB.

## 2014-09-14 NOTE — ED Notes (Signed)
MD at bedside. 

## 2014-09-14 NOTE — ED Provider Notes (Addendum)
CSN: [id number]     Arrival date & time 09/14/14  1815 History  This chart was scribed for Gwyneth SproutWhitney Stephano Arrants, MD by Octavia HeirArianna Nassar, ED Scribe. This patient was seen in room MH02/MH02 and the patient's care was started at 6:46 PM.    Chief Complaint  Patient presents with  . Chest Pain      The history is provided by the patient. No language interpreter was used.    HPI Comments: Angie Hill is a 74 y.o. female who has a hx of CAD and HTN presents to the Emergency Department complaining of a intermittent, gradual worsening chest pain onset a few days ago. She has associated shortness of breath and nausea (which is worse when she sits up). Pt notes the pain started in her upper back and moved from the right side of her chest to the left side of her chest. Pt notes she has had a constant pain in her chest for the past 6 hours. She says when she coughs and takes a deep breath it is painful. Also feels like pain may be worse with eating.  She reports having having this pain a long time ago and had a stent put in. Pt had a stress test two months ago. Pt says she has blistering on her right ankle that she is unknown of the cause. She reports her weight increasing but no change in diet. She reports having a hysterectomy. Pt denies vomiting.   Dr. Beverely Paceheek is her cardiologist Past Medical History  Diagnosis Date  . Coronary artery disease   . Hypertension   . Asthma   . Shortness of breath   . Sleep apnea   . Diabetes mellitus without complication     BORDERLINE  . GERD (gastroesophageal reflux disease)   . H/O hiatal hernia   . Arthritis    Past Surgical History  Procedure Laterality Date  . Coronary angioplasty with stent placement  07/04/2012    mid LAD  . Tubal ligation    . Abdominal hysterectomy      partial  . Left and right heart catheterization with coronary angiogram N/A 07/04/2012    Procedure: LEFT AND RIGHT HEART CATHETERIZATION WITH CORONARY ANGIOGRAM;  Surgeon: Pamella PertJagadeesh R  Ganji, MD;  [facility];  Service: Cardiovascular;  Laterality: N/A;  . Percutaneous coronary stent intervention (pci-s)  07/04/2012    Procedure: PERCUTANEOUS CORONARY STENT INTERVENTION (PCI-S);  Surgeon: Pamella PertJagadeesh R Ganji, MD;  [facility];  Service: Cardiovascular;;  . Left heart catheterization with coronary angiogram N/A 10/17/2012    Procedure: LEFT HEART CATHETERIZATION WITH CORONARY ANGIOGRAM;  Surgeon: Pamella PertJagadeesh R Ganji, MD;  [facility];  Service: Cardiovascular;  Laterality: N/A;   History reviewed. No pertinent family history. History  Substance Use Topics  . Smoking status: Never Smoker   . Smokeless tobacco: Never Used  . Alcohol Use: No   OB History    No data available     Review of Systems  A complete 10 system review of systems was obtained and all systems are negative except as noted in the HPI and PMH.    Allergies  Ciprofloxacin and Erythromycin  Home Medications   Prior to Admission medications   Medication Sig Start Date End Date Taking? Authorizing Provider  albuterol (PROVENTIL HFA;VENTOLIN HFA) 108 (90 BASE) MCG/ACT inhaler Inhale 2 puffs into the lungs 2 (two) times daily as needed for wheezing or shortness of breath.    Historical Provider, MD  amLODipine-valsartan (EXFORGE) 10-320 MG per tablet Take 1 tablet by mouth every morning.    Historical Provider, MD  aspirin EC 81 MG tablet Take 81 mg by mouth daily.    Historical Provider, MD  atorvastatin (LIPITOR) 80 MG tablet Take 80 mg by mouth daily.    Historical Provider, MD  b complex vitamins tablet Take 1 tablet by mouth daily.    Historical Provider, MD  Calcium Carbonate-Vitamin D (CALCIUM + D PO) Take 1 tablet by mouth daily.    Historical Provider, MD  clobetasol cream (TEMOVATE) 0.05 % Apply 1 application topically daily. Apply to rash.    Historical Provider, MD  Cyanocobalamin (VITAMIN B-12 PO) Take 1 tablet by mouth daily.     Historical Provider, MD  EPINEPHrine  (EPI-PEN) 0.3 mg/0.3 mL SOAJ Inject 0.3 mg into the muscle once as needed (for allergic reaction to pollen).    Historical Provider, MD  gabapentin (NEURONTIN) 300 MG capsule Take 300 mg by mouth at bedtime.    Historical Provider, MD  HYDROcodone-acetaminophen (NORCO/VICODIN) 5-325 MG per tablet Take 1 tablet by mouth every 6 (six) hours as needed for pain.    Historical Provider, MD  hydrOXYzine (VISTARIL) 50 MG capsule Take 50 mg by mouth 4 (four) times daily as needed for itching.    Historical Provider, MD  mirtazapine (REMERON) 15 MG tablet Take 15 mg by mouth as needed.  08/15/12   Historical Provider, MD  mirtazapine (REMERON) 15 MG tablet Take 15 mg by mouth at bedtime as needed (for sleep).    Historical Provider, MD  nebivolol (BYSTOLIC) 10 MG tablet Take 10 mg by mouth daily.    Historical Provider, MD  nitroGLYCERIN (NITROSTAT) 0.4 MG SL tablet Place 1 tablet (0.4 mg total) under the tongue every 5 (five) minutes as needed for chest pain. 07/05/12   Yates Decamp, MD  omeprazole (PRILOSEC) 20 MG capsule Take 20 mg by mouth daily.    Historical Provider, MD  Polyvinyl Alcohol-Povidone (REFRESH OP) Place 1 drop into both eyes daily as needed (for dry eyes).    Historical Provider, MD  promethazine (PHENERGAN) 25 MG tablet Take 25 mg by mouth every 6 (six) hours as needed for nausea.    Historical Provider, MD  Ticagrelor (BRILINTA) 90 MG TABS tablet Take 1 tablet (90 mg total) by mouth 2 (two) times daily. 07/05/12   Yates Decamp, MD  tiotropium (SPIRIVA) 18 MCG inhalation capsule Place 18 mcg into inhaler and inhale daily.    Historical Provider, MD   Triage vitals; BP 148/67 mmHg  Pulse 74  Temp(Src) 98.5 F (36.9 C) (Oral)  Resp 20  Ht 5\' 7"  (1.702 m)  Wt 270 lb (122.471 kg)  BMI 42.28 kg/m2  SpO2 98%  Physical Exam  Constitutional: She is oriented to person, place, and time. She appears well-developed and well-nourished. No distress.  HENT:  Head: Normocephalic.  Eyes: Conjunctivae  are normal. Pupils are equal, round, and reactive to light. No scleral icterus.  Neck: Normal range of motion. Neck supple. No thyromegaly present.  Cardiovascular: Normal rate and regular rhythm.  Exam reveals no gallop and no friction rub.   No murmur heard. Pulmonary/Chest: Effort normal and breath sounds normal. No respiratory distress. She has no wheezes. She has no rales.  Abdominal: Soft. Bowel sounds are normal. She exhibits no distension. There is no tenderness. There is no rebound and no guarding.  RUQ and epigastric tenderness, no Murphys Sign  Musculoskeletal: Normal range of motion.  Nickel sized bulla on the right shin, no significant edema on lower extremities  Neurological: She is alert and oriented to person, place, and time.  Skin: Skin is warm and dry. No rash noted.  Psychiatric: She has a normal mood and affect. Her behavior is normal.  Nursing note and vitals reviewed.   ED Course  Procedures  DIAGNOSTIC STUDIES: Oxygen Saturation is 98% on RA, normal by my interpretation.  COORDINATION OF CARE: 6:50 PM Discussed treatment plan which includes EKG with pt at bedside and pt agreed to plan.  Labs Review Labs Reviewed  COMPREHENSIVE METABOLIC PANEL - Abnormal; Notable for the following:    Potassium 3.3 (*)    Alkaline Phosphatase 36 (*)    All other components within normal limits  LIPASE, BLOOD - Abnormal; Notable for the following:    Lipase 13 (*)    All other components within normal limits  CBC WITH DIFFERENTIAL/PLATELET  TROPONIN I  I-STAT CG4 LACTIC ACID, ED    Imaging Review Dg Chest 2 View  09/14/2014   CLINICAL DATA:  Chest pain and shortness of breath started a few days ago.  EXAM: CHEST  2 VIEW  COMPARISON:  September 19, 2013  FINDINGS: The heart size and mediastinal contours are stable. The aorta is tortuous. There is patchy opacity of the medial right lung base. There is no pleural effusion or pulmonary edema. There is enlargement of the pulmonary  arteries. The visualized skeletal structures are stable.  IMPRESSION: Patchy consolidation of medial right lung base suspicious for pneumonia.   Electronically Signed   By: Sherian Rein M.D.   On: 09/14/2014 19:56     EKG Interpretation   Date/Time:  Saturday September 14 2014 18:22:21 EDT Ventricular Rate:  74 PR Interval:  236 QRS Duration: 88 QT Interval:  396 QTC Calculation: 439 R Axis:   36 Text Interpretation:  Sinus rhythm with 1st degree A-V block Otherwise  normal ECG No significant change since last tracing Confirmed by Anitra Lauth   MD, Alphonzo Lemmings ([id number]) on 09/14/2014 6:37:01 PM      MDM   Final diagnoses:  Chest pain  CAP (community acquired pneumonia)    Patient with multiple medical problems including coronary artery disease status post stenting most recently 2 years ago, hypertension, asthma, diabetes, GERD who presents today with an atypical chest pain that's been intermittent for the last few days. It started in her back moved to the right side of her chest and then over to the left side of her chest. She feels that is worse with coughing and taking deep breaths but also may be worse with eating. Cough is new in the last few days.  Patient also complains of nausea that's worse if she sits up for too long. She initially denies any abdominal pain however on exam she has right upper quadrant and epigastric tenderness which she feels is new. She is also noted a 5 pound weight gain in the last week. She denies changes in any medications and is now seeing a cardiologist in [place].  Patient's EKG is unchanged from prior. Given the tenderness in her abdomen and her vague story concerning that this could be cardiac versus GI.  Low suspicion at this time for an infectious etiology. Patient denies any productive cough or fever however she does have a new dry cough and a sensation of drainage which makes pneumonia a possibility.  Patient denies any recent surgery or travel with lower  suspicion of this being PE.  Also concern for cholecystitis, gastritis or pancreatitis. Low suspicion for appendicitis or diverticulitis. Patient has no urinary symptoms and unlikely to be a UTI.  Troponin, CBC, CMP, lipase, chest x-ray pending. Lactate is within normal limits  8:30 PM All labs including troponin are within normal limits. Chest x-ray shows patchy new opacity in the right lower lobe concerning for developing pneumonia. Patient's beginning right-sided chest pain as well as a new cough concern that this could be the cause of her symptoms. Her troponin is normal and that is after 6 hours of constant pain. Patient has not had any recent hospitalizations so will treat with rocephin and azithromycin  I personally performed the services described in this documentation, which was scribed in my presence.  The recorded information has been reviewed and considered.   Gwyneth Sprout, MD 09/14/14 [id number]  Gwyneth Sprout, MD 09/14/14 2034

## 2014-09-14 NOTE — ED Notes (Signed)
Pt states she has significant variations in weight lately as well, with noticing some swelling in her ankles

## 2014-09-14 NOTE — ED Notes (Signed)
\  [id number]\

## 2014-09-14 NOTE — ED Notes (Signed)
EKG done and given to EDP for review

## 2014-09-23 ENCOUNTER — Other Ambulatory Visit: Payer: Self-pay | Admitting: Physician Assistant

## 2014-09-23 DIAGNOSIS — I82403 Acute embolism and thrombosis of unspecified deep veins of lower extremity, bilateral: Secondary | ICD-10-CM

## 2014-09-26 ENCOUNTER — Other Ambulatory Visit: Payer: Medicare Other

## 2014-10-08 ENCOUNTER — Other Ambulatory Visit: Payer: Self-pay

## 2014-10-08 DIAGNOSIS — I714 Abdominal aortic aneurysm, without rupture, unspecified: Secondary | ICD-10-CM

## 2014-10-25 ENCOUNTER — Encounter (HOSPITAL_BASED_OUTPATIENT_CLINIC_OR_DEPARTMENT_OTHER): Payer: Medicare Other

## 2014-10-30 ENCOUNTER — Encounter: Payer: Self-pay | Admitting: Vascular Surgery

## 2014-10-31 ENCOUNTER — Ambulatory Visit (HOSPITAL_COMMUNITY)
Admission: RE | Admit: 2014-10-31 | Discharge: 2014-10-31 | Disposition: A | Discharge status: Home / Self Care | Payer: Medicare Other | Source: Ambulatory Visit | Attending: Vascular Surgery | Admitting: Vascular Surgery

## 2014-10-31 ENCOUNTER — Ambulatory Visit (INDEPENDENT_AMBULATORY_CARE_PROVIDER_SITE_OTHER): Payer: Medicare Other | Admitting: Vascular Surgery

## 2014-10-31 ENCOUNTER — Encounter: Payer: Self-pay | Admitting: Vascular Surgery

## 2014-10-31 VITALS — BP 133/79 | HR 72 | Temp 98.3°F | Ht 67.0 in | Wt 268.0 lb

## 2014-10-31 DIAGNOSIS — I77819 Aortic ectasia, unspecified site: Secondary | ICD-10-CM | POA: Diagnosis not present

## 2014-10-31 DIAGNOSIS — I714 Abdominal aortic aneurysm, without rupture, unspecified: Secondary | ICD-10-CM

## 2014-10-31 NOTE — Progress Notes (Signed)
[facility] [place] HISTORY AND PHYSICAL   History of Present Illness:  Patient is a 74 y.o. year old female who presents for evaluation of possible abdominal aortic aneurysm.  The patient has had chronic problems with upper abdominal pain. She has also previously had multiple dilations of her esophagus in the past. She apparently had an ultrasound recently which suggested 5.7 cm abdominal aortic aneurysm. Unfortunately the images and report are not available for her office visit today. However there is one line in her office notes which suggests this. She denies family history of abdominal aortic aneurysm. Risk factors include coronary artery disease and hypertension. She denies history of smoking. Other medical problems include sleep apnea, reflux, arthritis, COPD. All of these are currently stable.  Past Medical History  Diagnosis Date  . Coronary artery disease   . Hypertension   . Asthma   . Shortness of breath   . Sleep apnea   . Diabetes mellitus without complication     BORDERLINE  . GERD (gastroesophageal reflux disease)   . H/O hiatal hernia   . Arthritis   . Anemia   . COPD (chronic obstructive pulmonary disease)     Past Surgical History  Procedure Laterality Date  . Coronary angioplasty with stent placement  07/04/2012    mid LAD  . Tubal ligation    . Abdominal hysterectomy      partial  . Left and right heart catheterization with coronary angiogram N/A 07/04/2012    Procedure: LEFT AND RIGHT HEART CATHETERIZATION WITH CORONARY ANGIOGRAM;  Surgeon: Pamella Pert, MD;  [facility];  Service: Cardiovascular;  Laterality: N/A;  . Percutaneous coronary stent intervention (pci-s)  07/04/2012    Procedure: PERCUTANEOUS CORONARY STENT INTERVENTION (PCI-S);  Surgeon: Pamella Pert, MD;  Location: [facility] CATH LAB;  Service: Cardiovascular;;  . Left heart catheterization with coronary angiogram N/A 10/17/2012    Procedure: LEFT HEART CATHETERIZATION  WITH CORONARY ANGIOGRAM;  Surgeon: Pamella Pert, MD;  Location: [facility] CATH LAB;  Service: Cardiovascular;  Laterality: N/A;    Social History Social History  Substance Use Topics  . Smoking status: Never Smoker   . Smokeless tobacco: Never Used  . Alcohol Use: No    Family History Family History  Problem Relation Age of Onset  . Hyperlipidemia Sister   . Heart attack Sister   . Diabetes Daughter     Allergies  Allergies  Allergen Reactions  . Ciprofloxacin Itching and Rash  . Erythromycin Itching and Rash     Current Outpatient Prescriptions  Medication Sig Dispense Refill  . albuterol (PROVENTIL HFA;VENTOLIN HFA) 108 (90 BASE) MCG/ACT inhaler Inhale 2 puffs into the lungs 2 (two) times daily as needed for wheezing or shortness of breath.    Marland Kitchen aspirin EC 81 MG tablet Take 81 mg by mouth daily.    . AZOR 10-20 MG per tablet Take 1 tablet by mouth daily.  3  . b complex vitamins tablet Take 1 tablet by mouth daily.    . budesonide-formoterol (SYMBICORT) 160-4.5 MCG/ACT inhaler Inhale 2 puffs into the lungs daily.    . Calcium Carbonate-Vitamin D (CALCIUM + D PO) Take 1 tablet by mouth daily.    . clobetasol cream (TEMOVATE) 0.05 % Apply 1 application topically daily. Apply to rash.    . Cyanocobalamin (VITAMIN B-12 PO) Take 1 tablet by mouth daily.     Marland Kitchen gabapentin (NEURONTIN) 300 MG capsule Take 300 mg by mouth at bedtime.    Marland Kitchen  hydrALAZINE (APRESOLINE) 50 MG tablet Take 50 mg by mouth 2 (two) times daily.     Marland Kitchen HYDROcodone-acetaminophen (NORCO/VICODIN) 5-325 MG per tablet Take 1 tablet by mouth every 6 (six) hours as needed for pain.    . nebivolol (BYSTOLIC) 10 MG tablet Take 10 mg by mouth daily.    . pantoprazole (PROTONIX) 40 MG tablet Take 40 mg by mouth daily.     Marland Kitchen amLODipine-valsartan (EXFORGE) 10-320 MG per tablet Take 1 tablet by mouth every morning.    Marland Kitchen atorvastatin (LIPITOR) 80 MG tablet Take 80 mg by mouth daily.    Marland Kitchen azithromycin (ZITHROMAX) 250 MG tablet  Take 1 tablet (250 mg total) by mouth daily. Take first 2 tablets together, then 1 every day until finished. (Patient not taking: Reported on 10/31/2014) 6 tablet 0  . EPINEPHrine (EPI-PEN) 0.3 mg/0.3 mL SOAJ Inject 0.3 mg into the muscle once as needed (for allergic reaction to pollen).    . hydrOXYzine (VISTARIL) 50 MG capsule Take 50 mg by mouth 4 (four) times daily as needed for itching.    . mirtazapine (REMERON) 15 MG tablet Take 15 mg by mouth as needed.     . mirtazapine (REMERON) 15 MG tablet Take 15 mg by mouth at bedtime as needed (for sleep).    . nitroGLYCERIN (NITROSTAT) 0.4 MG SL tablet Place 1 tablet (0.4 mg total) under the tongue every 5 (five) minutes as needed for chest pain. (Patient not taking: Reported on 10/31/2014) 30 tablet 12  . omeprazole (PRILOSEC) 20 MG capsule Take 20 mg by mouth daily.    . Polyvinyl Alcohol-Povidone (REFRESH OP) Place 1 drop into both eyes daily as needed (for dry eyes).    . promethazine (PHENERGAN) 25 MG tablet Take 25 mg by mouth every 6 (six) hours as needed for nausea.    . Ticagrelor (BRILINTA) 90 MG TABS tablet Take 1 tablet (90 mg total) by mouth 2 (two) times daily. (Patient not taking: Reported on 10/31/2014) 60 tablet 0  . tiotropium (SPIRIVA) 18 MCG inhalation capsule Place 18 mcg into inhaler and inhale daily.     No current facility-administered medications for this visit.    ROS:   General:  No weight loss, Fever, chills  HEENT: No recent headaches, no nasal bleeding, no visual changes, no sore throat  Neurologic: No dizziness, blackouts, seizures. No recent symptoms of stroke or mini- stroke. No recent episodes of slurred speech, or temporary blindness.  Cardiac: No recent episodes of chest pain/pressure, no shortness of breath at rest.  No shortness of breath with exertion.  Denies history of atrial fibrillation or irregular heartbeat  Vascular: No history of rest pain in feet.  No history of claudication.  No history of  non-healing ulcer, No history of DVT   Pulmonary: No home oxygen, no productive cough, no hemoptysis, + asthma or wheezing  Musculoskeletal:  [ ]  Arthritis, [ ]  Low back pain,  [ ]  Joint pain  Hematologic:No history of hypercoagulable state.  No history of easy bleeding.  No history of anemia  Gastrointestinal: No hematochezia or melena,  No gastroesophageal reflux, no trouble swallowing  Urinary: [ ]  chronic Kidney disease, [ ]  on HD - [ ]  MWF or [ ]  TTHS, [ ]  Burning with urination, [ ]  Frequent urination, [ ]  Difficulty urinating;   Skin: No rashes  Psychological: No history of anxiety,  No history of depression   Physical Examination  Filed Vitals:   10/31/14 1610 10/31/14 0928  BP:  146/77 133/79  Pulse: 72   Temp: 98.3 F (36.8 C)   TempSrc: Oral   Height:  (1.702 m)   Weight: 268 lb (121.564 kg)   SpO2: 98%     Body mass index is 41.96 kg/(m^2).  General:  Alert and oriented, no acute distress HEENT: Normal Neck: No bruit or JVD Pulmonary: Clear to auscultation bilaterally Cardiac: Regular Rate and Rhythm without murmur Abdomen: Soft, non-tender, non-distended, no mass, obese Skin: No rash Extremity Pulses:  2+ radial, brachial, femoral pulses bilaterally Musculoskeletal: No deformity or edema  Neurologic: Upper and lower extremity motor 5/5 and symmetric  DATA:  Patient had an aortic duplex exam and our office today. This showed maximal aortic diameter of 2.8 cm. No abdominal aortic aneurysm visualized.   ASSESSMENT:  Patient with 2.8 cm abdominal aorta which is in the normal range. Since she has discrepant ultrasounds I believe it is worthwhile to repeat her aortic ultrasound in 1 year to make sure there is no change. If her aortic diameter is unchanged in one year. She can follow-up on as-needed basis.  I encouraged the patient to follow-up with her GI doctor that has done her previous esophageal dilations if she is having difficulty swallowing and upper  Abdominal pain.   PLAN:  See above  Fabienne Bruns, MD [facility] Office: [phone] Pager: [phone]

## 2014-11-01 NOTE — Addendum Note (Signed)
Addended by: Adria Dill L on: 11/01/2014 11:36 AM   Modules accepted: Orders

## 2015-05-20 ENCOUNTER — Other Ambulatory Visit: Payer: Self-pay | Admitting: Physician Assistant

## 2015-05-20 DIAGNOSIS — R5381 Other malaise: Secondary | ICD-10-CM

## 2015-05-28 ENCOUNTER — Other Ambulatory Visit: Payer: Self-pay | Admitting: Physician Assistant

## 2015-05-28 DIAGNOSIS — E2839 Other primary ovarian failure: Secondary | ICD-10-CM

## 2015-06-02 ENCOUNTER — Other Ambulatory Visit: Payer: Medicare Other

## 2015-06-17 ENCOUNTER — Ambulatory Visit
Admission: RE | Admit: 2015-06-17 | Discharge: 2015-06-17 | Disposition: A | Discharge status: Home / Self Care | Payer: Medicare Other | Source: Ambulatory Visit | Attending: Physician Assistant | Admitting: Physician Assistant

## 2015-06-17 DIAGNOSIS — E2839 Other primary ovarian failure: Secondary | ICD-10-CM

## 2015-07-24 ENCOUNTER — Encounter: Payer: Self-pay | Admitting: Rehabilitative and Restorative Service Providers"

## 2015-07-24 ENCOUNTER — Ambulatory Visit: Payer: Medicare Other | Attending: Internal Medicine | Admitting: Rehabilitative and Restorative Service Providers"

## 2015-07-24 DIAGNOSIS — R293 Abnormal posture: Secondary | ICD-10-CM | POA: Diagnosis present

## 2015-07-24 DIAGNOSIS — M25512 Pain in left shoulder: Secondary | ICD-10-CM | POA: Insufficient documentation

## 2015-07-24 DIAGNOSIS — R29898 Other symptoms and signs involving the musculoskeletal system: Secondary | ICD-10-CM | POA: Diagnosis present

## 2015-07-24 NOTE — Patient Instructions (Signed)
Try using the swim noodle along the spine to help you straighten up tall   Self massage using a 4 inch rubber ball  Axial Extension (Chin Tuck)    Pull chin in and lengthen back of neck. Hold __10_ seconds while counting out loud. Repeat __5-10__ times. Do __4-5__ sessions per day.  Shoulder Blade Squeeze   Can place swim noodle along spine  Rotate shoulders back, then squeeze shoulder blades down and back Hold 10 sec Repeat __10__ times. Do __several __ sessions per day.     Flexors Stretch, Standing    Stand near wall and slide arm up, with palm facing away from wall, by leaning toward wall. Hold _10__ seconds.  Repeat _5__ times per session. Do _2-3__ sessions per day.   TENS UNIT: This is helpful for muscle pain and spasm.   Search and Purchase a TENS 7000 2nd edition at www.tenspros.com. It should be less than $30.     TENS unit instructions: Do not shower or bathe with the unit on Turn the unit off before removing electrodes or batteries If the electrodes lose stickiness add a drop of water to the electrodes after they are disconnected from the unit and place on plastic sheet. If you continued to have difficulty, call the TENS unit company to purchase more electrodes. Do not apply lotion on the skin area prior to use. Make sure the skin is clean and dry as this will help prolong the life of the electrodes. After use, always check skin for unusual red areas, rash or other skin difficulties. If there are any skin problems, does not apply electrodes to the same area. Never remove the electrodes from the unit by pulling the wires. Do not use the TENS unit or electrodes other than as directed. Do not change electrode placement without consultating your therapist or physician. Keep 2 fingers with between each electrode.

## 2015-07-24 NOTE — Therapy (Signed)
[facility] [address]  [address] [place], [place], [address] Phone: [phone]   Fax:  [phone]  Physical Therapy Evaluation  Patient Details  Name: Angie HoustonVersie M Calcaterra MRN: [id number] Date of Birth: November 21, 1940 Referring Provider: Dr. Jackie PlumGeorge Osei-Bonsu  Encounter Date: 07/24/2015      PT End of Session - 07/24/15 0927    Visit Number 1   Number of Visits 12   Date for PT Re-Evaluation 09/04/15   PT Start Time 0838   PT Stop Time 0933   PT Time Calculation (min) 55 min   Activity Tolerance Patient tolerated treatment well      Past Medical History  Diagnosis Date  . Coronary artery disease   . Hypertension   . Asthma   . Shortness of breath   . Sleep apnea   . Diabetes mellitus without complication (HCC)     BORDERLINE  . GERD (gastroesophageal reflux disease)   . H/O hiatal hernia   . Arthritis   . Anemia   . COPD (chronic obstructive pulmonary disease) [facility](HCC)     Past Surgical History  Procedure Laterality Date  . Coronary angioplasty with stent placement  07/04/2012    mid LAD  . Tubal ligation    . Abdominal hysterectomy      partial  . Left and right heart catheterization with coronary angiogram N/A 07/04/2012    Procedure: LEFT AND RIGHT HEART CATHETERIZATION WITH CORONARY ANGIOGRAM;  Surgeon: Pamella PertJagadeesh R Ganji, MD;  [facility];  Service: Cardiovascular;  Laterality: N/A;  . Percutaneous coronary stent intervention (pci-s)  07/04/2012    Procedure: PERCUTANEOUS CORONARY STENT INTERVENTION (PCI-S);  Surgeon: Pamella PertJagadeesh R Ganji, MD;  [facility];  Service: Cardiovascular;;  . Left heart catheterization with coronary angiogram N/A 10/17/2012    Procedure: LEFT HEART CATHETERIZATION WITH CORONARY ANGIOGRAM;  Surgeon: Pamella PertJagadeesh R Ganji, MD;  [facility];  Service: Cardiovascular;  Laterality: N/A;    There were no vitals filed for this visit.       Subjective Assessment -  07/24/15 0823    Subjective Patient reports that she is having Lt shoulder pain which has been present for the past 2 months with significant increase in pain in the past 2 weeks. She does not know of any injury. She has constant pain in the shoulder and difficulty using the Lt arm.    Pertinent History AODM; Arthritis; COPD; LBP; HTN   How long can you sit comfortably? not at all   How long can you stand comfortably? 10-15 min    How long can you walk comfortably? 15 min    Diagnostic tests xray    Patient Stated Goals get rid of the shoulder pain    Currently in Pain? Yes   Pain Score 9    Pain Location Shoulder   Pain Orientation Left   Pain Descriptors / Indicators Throbbing;Pounding;Nagging   Pain Type Acute pain   Pain Radiating Towards down arm to elbow and wrist; Lt side of neck down to shoudler    Pain Onset 1 to 4 weeks ago   Pain Frequency Constant   Aggravating Factors  "it's just there"    Pain Relieving Factors resting arm out to side or bringing arm to body    Effect of Pain on Daily Activities limited use of the Lt UE             [facility] PT Assessment - 07/24/15 0001  Assessment   Medical Diagnosis Lt shoulder pain    Referring Provider Dr. Greggory Stallion Osei-Bonsu   Onset Date/Surgical Date 05/14/15  worsening in the past 2 weeks    Hand Dominance Right   Next MD Visit 08/01/15   Prior Therapy none   Precautions   Precautions None   Balance Screen   Has the patient fallen in the past 6 months No   Has the patient had a decrease in activity level because of a fear of falling?  No   Is the patient reluctant to leave their home because of a fear of falling?  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    Leisure household chores; cooking walking 3 times/week about 3 blocks 20 min    Observation/Other  Assessments   Focus on Therapeutic Outcomes (FOTO)  79% limitation    Observation/Other Assessments-Edema    Edema --  edema noted Lt shoulder/arm    Sensation   Additional Comments tingling and numbness in arm and forearm into wrist intermittently sometimes at night    Posture/Postural Control   Posture Comments head forward; shoulders rounded and eelvated; head of the humerus anterior in orientation; increased thoracic kyphosis; scapulae abducted and rotated along the thoracic wall    AROM   Right Shoulder Extension 51 Degrees   Right Shoulder Flexion 126 Degrees   Right Shoulder ABduction 124 Degrees   Right Shoulder Internal Rotation 25 Degrees   Right Shoulder External Rotation 76 Degrees   Left Shoulder Extension 38 Degrees   Left Shoulder Flexion 107 Degrees   Left Shoulder ABduction 102 Degrees   Left Shoulder Internal Rotation 18 Degrees   Left Shoulder External Rotation 51 Degrees   Cervical Flexion 56   Cervical Extension 30   Cervical - Right Side Bend 20   Cervical - Left Side Bend 17   Cervical - Right Rotation 51   Cervical - Left Rotation 44   Strength   Right Shoulder Flexion --  Rt UE strength 5/5    Left Shoulder Flexion --  5-/5   Left Shoulder Extension 5/5   Left Shoulder ABduction 4+/5   Left Shoulder Internal Rotation --  5-/5   Left Shoulder External Rotation 4+/5   Palpation   Palpation comment tenderness to palpation through the cervical and Rt shoulder girdle throughout - tight through pecs; upper trap; leveator; biceps; deltoid into the arm and forearm                    [facility] Adult PT Treatment/Exercise - 07/24/15 0001    Therapeutic Activites    Therapeutic Activities --  myofacial ball release work shoulder girdle    Neuro Re-ed    Neuro Re-ed Details  working on posture and alignment    Shoulder Exercises: Standing   Other Standing Exercises axial extension 10 sec x 5 with swim noodle   Other Standing Exercises scap squeeze  with noodle 10 sec x 10    Shoulder Exercises: Stretch   Wall Stretch - Flexion 2 reps;10 seconds  Lt UE - assisting with Rt UE as needed    Insurance claims handler Stimulation Location Lt shoulder    Electrical Stimulation Action IFC   Electrical Stimulation Parameters to tolerance    Electrical Stimulation Goals Pain;Tone  Vasopneumatic   Number Minutes Vasopneumatic  15 minutes   Vasopnuematic Location  Shoulder   Vasopneumatic Pressure Low   Vasopneumatic Temperature  3* coldest                 PT Education - 07/24/15 1610    Education provided Yes   Education Details posture and alignment; HEP   Person(s) Educated Patient   Methods Explanation;Demonstration;Tactile cues;Verbal cues;Handout   Comprehension Verbalized understanding;Returned demonstration;Verbal cues required;Tactile cues required             PT Long Term Goals - 07/24/15 0944    PT LONG TERM GOAL #1   Title Improve posture and alignment with patient to demonstrate imporved alignment through cervical and thoracic spine with improved positioin of scapulae on the thoracic spine to improve mechanics of shoulder function 09/04/15   Time 6   Period Weeks   Status New   PT LONG TERM GOAL #2   Title Increase AROM Lt shoudler to equal or greater than AROM Rt shoulder 09/04/15   Time 6   Period Weeks   Status New   PT LONG TERM GOAL #3   Title 5/5 strength Lt shoulder 09/04/15   Time 6   Period Weeks   Status New   PT LONG TERM GOAL #4   Title Patient reports improved functional use of LT UE allowing her to preform ADL's with minimal to no pain 09/04/15   Time 6   Period Weeks   Status New   PT LONG TERM GOAL #5   Title Independent in HEP 09/04/15   Time 6   Period Weeks   Status New   PT LONG TERM GOAL #6   Title Improve FOTO to </= 46% limitation 09/04/15   Time 6   Period Weeks   Status New               Plan - 07/24/15 9604    Clinical Impression Statement Ileene Patrick  presents with idiopathic Lt shoudler pain present for the past 2 months with symptoms increasing significantly in the past 2 weeks. She has sedentary lifestyle, reclining propped on pillows for a good bit of the day. She has poor posture and alignment; limited cervical/thoracic and bilat shoulder ROM; pain with resistive testing Lt shoulder strength; weakness Lt shoulder; tenderness to palpation and intermittent tingling and numbness into the Lt UE. Patient will benefit from PT to address porblems identified.    Rehab Potential Good   PT Frequency 2x / week   PT Duration 6 weeks   PT Treatment/Interventions Patient/family education;ADLs/Self Care Home Management;Cryotherapy;Electrical Stimulation;Iontophoresis 4mg /ml Dexamethasone;Moist Heat;Ultrasound;Neuromuscular re-education;Dry needling;Therapeutic activities   PT Next Visit Plan ROM Lt shoulder; cont postural correction; posterior shoulder girdle strengthening; progress HEP    PT Home Exercise Plan postural correction; HEP; TENS info    Consulted and Agree with Plan of Care Patient     Good response to initial treatment with improved posture noted. Patient reports decrease in pain with e-stim and vaso. Indicated that she will pursue purchase of TENS unit for management of pain.   Patient will benefit from skilled therapeutic intervention in order to improve the following deficits and impairments:  Postural dysfunction, Improper body mechanics, Pain, Decreased range of motion, Decreased mobility, Decreased strength, Increased fascial restricitons, Decreased activity tolerance  Visit Diagnosis: Pain in left shoulder - Plan: PT plan of care cert/re-cert  Abnormal posture - Plan: PT plan of care cert/re-cert  Other symptoms and signs involving the  musculoskeletal system - Plan: PT plan of care cert/re-cert      G-Codes - Aug 07, 2015 4098    Functional Limitation Changing and maintaining body position   Changing and Maintaining Body Position  Current Status (J1914) At least 60 percent but less than 80 percent impaired, limited or restricted   Changing and Maintaining Body Position Goal Status (N8295) At least 40 percent but less than 60 percent impaired, limited or restricted       Problem List Patient Active Problem List   Diagnosis Date Noted  . Onychomycosis 09/01/2012  . Pain in joint, ankle and foot 09/01/2012  . Dermatitis 09/01/2012  . Unspecified hereditary and idiopathic peripheral neuropathy 09/01/2012  . S/P PTCA (percutaneous transluminal coronary angioplasty) 07/05/2012  . Essential hypertension 07/05/2012  . Hyperlipidemia 07/05/2012    Daquarius Dubeau Rober Minion PT, MPH  08-07-2015, 9:54 AM  [facility] [address]  [address] [place], [place], [address] Phone: [phone]   Fax:  [phone]  Name: MALAYIAH MCBRAYER MRN: [id number] Date of Birth: 1940/11/16

## 2015-07-29 ENCOUNTER — Encounter: Payer: Medicare Other | Attending: Internal Medicine | Admitting: Skilled Nursing Facility1

## 2015-07-29 VITALS — Ht 68.0 in | Wt 276.0 lb

## 2015-07-29 DIAGNOSIS — E119 Type 2 diabetes mellitus without complications: Secondary | ICD-10-CM | POA: Diagnosis not present

## 2015-07-30 ENCOUNTER — Encounter: Payer: Self-pay | Admitting: Skilled Nursing Facility1

## 2015-08-01 ENCOUNTER — Ambulatory Visit: Payer: Medicare Other | Admitting: Physical Therapy

## 2015-08-05 ENCOUNTER — Encounter: Payer: Medicare Other | Admitting: *Deleted

## 2015-08-05 ENCOUNTER — Encounter: Payer: Self-pay | Admitting: *Deleted

## 2015-08-05 DIAGNOSIS — E119 Type 2 diabetes mellitus without complications: Secondary | ICD-10-CM

## 2015-08-05 NOTE — Progress Notes (Signed)

## 2015-08-06 ENCOUNTER — Ambulatory Visit: Payer: Medicare Other | Admitting: Physical Therapy

## 2015-08-06 DIAGNOSIS — M25512 Pain in left shoulder: Secondary | ICD-10-CM | POA: Diagnosis not present

## 2015-08-06 DIAGNOSIS — R293 Abnormal posture: Secondary | ICD-10-CM

## 2015-08-06 DIAGNOSIS — R29898 Other symptoms and signs involving the musculoskeletal system: Secondary | ICD-10-CM

## 2015-08-06 NOTE — Therapy (Signed)
[facility] [place] [address]  [address] [place], [place], [address] Phone: [phone]   Fax:  [phone]  Physical Therapy Treatment  Patient Details  Name: Angie Hill MRN: [id number] Date of Birth: March 27, 1940 Referring Provider: Dr. Jackie PlumGeorge Osei-Bonsu  Encounter Date: 08/06/2015      PT End of Session - 08/06/15 1026    Visit Number 2   Number of Visits 12   Date for PT Re-Evaluation 09/04/15   PT Start Time 1020   PT Stop Time 1107   PT Time Calculation (min) 47 min   Activity Tolerance Patient tolerated treatment well   Behavior During Therapy [facility] for tasks assessed/performed      Past Medical History  Diagnosis Date  . Coronary artery disease   . Hypertension   . Asthma   . Shortness of breath   . Sleep apnea   . Diabetes mellitus without complication (HCC)     BORDERLINE  . GERD (gastroesophageal reflux disease)   . H/O hiatal hernia   . Arthritis   . Anemia   . COPD (chronic obstructive pulmonary disease) [facility](HCC)     Past Surgical History  Procedure Laterality Date  . Coronary angioplasty with stent placement  07/04/2012    mid LAD  . Tubal ligation    . Abdominal hysterectomy      partial  . Left and right heart catheterization with coronary angiogram N/A 07/04/2012    Procedure: LEFT AND RIGHT HEART CATHETERIZATION WITH CORONARY ANGIOGRAM;  Surgeon: Pamella PertJagadeesh R Ganji, MD;  [facility];  Service: Cardiovascular;  Laterality: N/A;  . Percutaneous coronary stent intervention (pci-s)  07/04/2012    Procedure: PERCUTANEOUS CORONARY STENT INTERVENTION (PCI-S);  Surgeon: Pamella PertJagadeesh R Ganji, MD;  [facility];  Service: Cardiovascular;;  . Left heart catheterization with coronary angiogram N/A 10/17/2012    Procedure: LEFT HEART CATHETERIZATION WITH CORONARY ANGIOGRAM;  Surgeon: Pamella PertJagadeesh R Ganji, MD;  [facility];  Service: Cardiovascular;  Laterality: N/A;    There were no vitals  filed for this visit.      Subjective Assessment - 08/06/15 1025    Subjective Pt states L shouler is acting up today, thinks it may be partially due to inclement weather.   Patient Stated Goals get rid of the shoulder pain    Currently in Pain? Yes   Pain Score 5    Pain Location Shoulder   Pain Orientation Left   Pain Descriptors / Indicators Sharp;Stabbing           Today's Treatment  TherEx Seated chin tuck 10x5" Standing against pool noodle on wall - B Scapular retraction x10 (pt demonstrating tendency to elevate shoulders with retraction) Hooklying on pool noodle:   Chest/pec stretch x2'   B Shoulder Horiz ABD with red TB 10x3"   B Shoulder ER with red TB 10x3"   B Shoulder Flexion with wand/cane 10x5"  Manual STM/TPR to L UT & levator Stretch to L UT & levator  TherEx Seated UT & levator stretches x30" each           PT Education - 08/06/15 1457    Education provided Yes   Education Details Expanded HEP   Person(s) Educated Patient   Methods Explanation;Demonstration;Handout   Comprehension Verbalized understanding;Returned demonstration;Need further instruction             PT Long Term Goals - 08/06/15 1457    PT LONG TERM GOAL #1  Title Improve posture and alignment with patient to demonstrate imporved alignment through cervical and thoracic spine with improved positioin of scapulae on the thoracic spine to improve mechanics of shoulder function 09/04/15   Status On-going   PT LONG TERM GOAL #2   Title Increase AROM Lt shoudler to equal or greater than AROM Rt shoulder 09/04/15   Status On-going   PT LONG TERM GOAL #3   Title 5/5 strength Lt shoulder 09/04/15   Status On-going   PT LONG TERM GOAL #4   Title Patient reports improved functional use of LT UE allowing her to preform ADL's with minimal to no pain 09/04/15   Status On-going   PT LONG TERM GOAL #5   Title Independent in HEP 09/04/15   Status On-going   PT LONG TERM GOAL #6    Title Improve FOTO to </= 46% limitation 09/04/15   Status On-going               Plan - 08/06/15 1459    Clinical Impression Statement Pt returns to PT with pain decreased to 5/10 from 9/10 at time of eval. Pt reports good compliance with HEP and denies any concerns but had difficulty reproducing exercises during today's visit. Pt demonstrates tendency to elevate shoulder during all exercises attempts and closer assessment revealed significant tightness and TP's in L UT & levaotr scapulae which were addressed with STM/TPR and manual therapy. HEP updated to include stretching for upper shoulder/neck as well as chest/pecs along with scapular strengthening/stabilization.   Rehab Potential Good   PT Treatment/Interventions Patient/family education;ADLs/Self Care Home Management;Cryotherapy;Electrical Stimulation;Iontophoresis /ml Dexamethasone;Moist Heat;Ultrasound;Neuromuscular re-education;Dry needling;Therapeutic activities   PT Next Visit Plan ROM Lt shoulder; cont postural correction; posterior shoulder girdle strengthening; progress HEP    PT Home Exercise Plan postural correction; HEP; TENS info    Consulted and Agree with Plan of Care Patient      Patient will benefit from skilled therapeutic intervention in order to improve the following deficits and impairments:  Postural dysfunction, Improper body mechanics, Pain, Decreased range of motion, Decreased mobility, Decreased strength, Increased fascial restricitons, Decreased activity tolerance  Visit Diagnosis: Pain in left shoulder  Abnormal posture  Other symptoms and signs involving the musculoskeletal system     Problem List Patient Active Problem List   Diagnosis Date Noted  . Onychomycosis 09/01/2012  . Pain in joint, ankle and foot 09/01/2012  . Dermatitis 09/01/2012  . Unspecified hereditary and idiopathic peripheral neuropathy 09/01/2012  . S/P PTCA (percutaneous transluminal coronary angioplasty) 07/05/2012   . Essential hypertension 07/05/2012  . Hyperlipidemia 07/05/2012    Marry Guan, PT, MPT 08/06/2015, 3:05 PM  [facility] [address]  [address] [place], [place], [address] Phone: [phone]   Fax:  [phone]  Name: Angie Hill MRN: [id number] Date of Birth: 12/01/1940

## 2015-08-07 ENCOUNTER — Ambulatory Visit: Payer: Medicare Other

## 2015-08-07 DIAGNOSIS — R293 Abnormal posture: Secondary | ICD-10-CM

## 2015-08-07 DIAGNOSIS — M25512 Pain in left shoulder: Secondary | ICD-10-CM | POA: Diagnosis not present

## 2015-08-07 DIAGNOSIS — R29898 Other symptoms and signs involving the musculoskeletal system: Secondary | ICD-10-CM

## 2015-08-07 NOTE — Therapy (Addendum)
[facility] [address]  [place], [place], [address] Phone: [phone]   Fax:  [phone]  Physical Therapy Treatment  Patient Details  Name: Angie Hill MRN: [id number] Date of Birth: [birth year] Referring Provider: Dr. Benito Mccreedy  Encounter Date: 08/07/2015      PT End of Session - 08/07/15 1843    Visit Number 3   Number of Visits 12   Date for PT Re-Evaluation 09/04/15   PT Start Time 0935   PT Stop Time 1015   PT Time Calculation (min) 40 min   Activity Tolerance Patient tolerated treatment well   Behavior During Therapy [facility] for tasks assessed/performed      Past Medical History  Diagnosis Date  . Coronary artery disease   . Hypertension   . Asthma   . Shortness of breath   . Sleep apnea   . Diabetes mellitus without complication (HCC)     BORDERLINE  . GERD (gastroesophageal reflux disease)   . H/O hiatal hernia   . Arthritis   . Anemia   . COPD (chronic obstructive pulmonary disease) [facility]     Past Surgical History  Procedure Laterality Date  . Coronary angioplasty with stent placement  07/04/2012    mid LAD  . Tubal ligation    . Abdominal hysterectomy      partial  . Left and right heart catheterization with coronary angiogram N/A 07/04/2012    Procedure: LEFT AND RIGHT HEART CATHETERIZATION WITH CORONARY ANGIOGRAM;  Surgeon: Laverda Page, MD;  Location: [facility] CATH LAB;  Service: Cardiovascular;  Laterality: N/A;  . Percutaneous coronary stent intervention (pci-s)  07/04/2012    Procedure: PERCUTANEOUS CORONARY STENT INTERVENTION (PCI-S);  Surgeon: Laverda Page, MD;  Location[facility] C[facility] LAB;  Service: Cardiovascular;;  . Left heart catheterization with coronary angiogram N/A 10/17/2012    Procedure: LEFT HEART CATHETERIZATION WITH CORONARY ANGIOGRAM;  Surgeon: Laverda Page, MD;  [facility] C[facility] LAB;  Service: Cardiovascular;  Laterality: N/A;    There were no vitals  filed for this visit.      Subjective Assessment - 08/07/15 1841    Subjective Pt. reports her L shoulder at a pain of 4/10 initially today; pt. reports she has not gotten to perform the HEP issued yesterday to her via handout at this time.     Patient Stated Goals get rid of the shoulder pain    Currently in Pain? Yes   Pain Score 4    Pain Location Shoulder   Pain Orientation Left   Pain Descriptors / Indicators Aching;Stabbing   Pain Type Acute pain   Pain Onset 1 to 4 weeks ago   Multiple Pain Sites No      Today's Treatment:   TherEx:  Seated chin tuck 15x5" Hooklying:   Chest/pec stretch x 2'   B Shoulder ER with red TB 15 x 3"; with B towels next to sides    B Shoulder Flexion with wand/cane 15 x 3"    B shoulder horizontal adduction with wand/cane 15 x 3"   Manual:  STM/TPR to L UT & levator x 15 min  Stretch to L UT & levator x 30 sec   TherEx:  Seated UT & levator stretches x 30" each      [facility] PT Assessment - 08/07/15 0946    Assessment   Medical Diagnosis Lt shoulder pain    Referring Provider Dr. Benito Mccreedy   Next MD  Visit 08/19/15             PT Long Term Goals - 08/06/15 1457    PT LONG TERM GOAL #1   Title Improve posture and alignment with patient to demonstrate imporved alignment through cervical and thoracic spine with improved positioin of scapulae on the thoracic spine to improve mechanics of shoulder function 09/04/15   Status On-going   PT LONG TERM GOAL #2   Title Increase AROM Lt shoudler to equal or greater than AROM Rt shoulder 09/04/15   Status On-going   PT LONG TERM GOAL #3   Title 5/5 strength Lt shoulder 09/04/15   Status On-going   PT LONG TERM GOAL #4   Title Patient reports improved functional use of LT UE allowing her to preform ADL's with minimal to no pain 09/04/15   Status On-going   PT LONG TERM GOAL #5   Title Independent in HEP 09/04/15   Status On-going   PT LONG TERM GOAL #6   Title Improve FOTO to </=  46% limitation 09/04/15   Status On-going               Plan - 08/07/15 1844    Clinical Impression Statement Pt. reports a 4/10 L shoulder pain initially today and reports she has not been able to perform HEP issued to her yesterday yet.  Today's treatment focused on HEP review with pt. and addition of STM/TPR to L UT; pt. very guarded to tight in L UT and would benefit from further ST work to this area to improve L shoulder funtion.     PT Next Visit Plan ROM Lt shoulder; cont postural correction; posterior shoulder girdle strengthening; progress HEP       Patient will benefit from skilled therapeutic intervention in order to improve the following deficits and impairments:  Postural dysfunction, Improper body mechanics, Pain, Decreased range of motion, Decreased mobility, Decreased strength, Increased fascial restricitons, Decreased activity tolerance  Visit Diagnosis: Pain in left shoulder  Abnormal posture  Other symptoms and signs involving the musculoskeletal system     Problem List Patient Active Problem List   Diagnosis Date Noted  . Onychomycosis 09/01/2012  . Pain in joint, ankle and foot 09/01/2012  . Dermatitis 09/01/2012  . Unspecified hereditary and idiopathic peripheral neuropathy 09/01/2012  . S/P PTCA (percutaneous transluminal coronary angioplasty) 07/05/2012  . Essential hypertension 07/05/2012  . Hyperlipidemia 07/05/2012    Bess Harvest, PTA 08/07/2015, 6:55 PM  [facility] [address]  [place], [place], [address] Phone: [phone]   Fax:  [phone]  Name: Angie Hill MRN: [id number] Date of Birth: 05/21/1940   PHYSICAL THERAPY DISCHARGE SUMMARY  Visits from Start of Care: 3  Current functional level related to goals / functional outcomes: Persistent Lt shoulder pain with limited functional abilities reported by patient.    Remaining deficits: Continued pain; poor  posture and alignment; limited ROM/mobility and decreased function.    Education / Equipment: HEP Plan: Patient agrees to discharge.  Patient goals were not met. Patient is being discharged due to not returning since the last visit.  ?????    Celyn P. Helene Kelp PT, MPH 09/18/2015 11:18 AM

## 2015-08-12 ENCOUNTER — Encounter: Payer: Medicare Other | Admitting: Skilled Nursing Facility1

## 2015-08-12 ENCOUNTER — Encounter: Payer: Self-pay | Admitting: Skilled Nursing Facility1

## 2015-08-12 DIAGNOSIS — E119 Type 2 diabetes mellitus without complications: Secondary | ICD-10-CM | POA: Diagnosis not present

## 2015-08-12 NOTE — Progress Notes (Signed)
Patient was seen on 08/12/15 for the third of a series of three diabetes self-management courses at the [facility]. The following learning objectives were met by the patient during this class:  . State the amount of activity recommended for healthy living . Describe activities suitable for individual needs . Identify ways to regularly incorporate activity into daily life . Identify barriers to activity and ways to over come these barriers  Identify diabetes medications being personally used and their primary action for lowering glucose and possible side effects . Describe role of stress on blood glucose and develop strategies to address psychosocial issues . Identify diabetes complications and ways to prevent them  Explain how to manage diabetes during illness . Evaluate success in meeting personal goal . Establish 2-3 goals that they will plan to diligently work on until they return for the  4-month follow-up visit  Goals:   I will count my carb choices at most meals and snacks  I will be active  minutes or more  times a week  I will take my diabetes medications as scheduled  I will eat less unhealthy fats by eating less   I will test my glucose at least  times a day,  days a week  I will look at patterns in my record book at least  days a month  To help manage stress I will   at least  times a week    Your patient has identified these potential barriers to change:  Motivation Finances Stress Lack of Family Support   Your patient has identified their diabetes self-care support plan as  [facility] Support Group Family Support Magazine Subscriptions On-line Resources    

## 2015-09-15 ENCOUNTER — Emergency Department (HOSPITAL_BASED_OUTPATIENT_CLINIC_OR_DEPARTMENT_OTHER)
Admission: EM | Admit: 2015-09-15 | Discharge: 2015-09-15 | Disposition: A | Discharge status: Home / Self Care | Payer: Medicare Other | Attending: Emergency Medicine | Admitting: Emergency Medicine

## 2015-09-15 ENCOUNTER — Encounter (HOSPITAL_BASED_OUTPATIENT_CLINIC_OR_DEPARTMENT_OTHER): Payer: Self-pay | Admitting: Emergency Medicine

## 2015-09-15 DIAGNOSIS — Z7984 Long term (current) use of oral hypoglycemic drugs: Secondary | ICD-10-CM | POA: Insufficient documentation

## 2015-09-15 DIAGNOSIS — S80821A Blister (nonthermal), right lower leg, initial encounter: Secondary | ICD-10-CM | POA: Diagnosis present

## 2015-09-15 DIAGNOSIS — Y929 Unspecified place or not applicable: Secondary | ICD-10-CM | POA: Diagnosis not present

## 2015-09-15 DIAGNOSIS — Y939 Activity, unspecified: Secondary | ICD-10-CM | POA: Diagnosis not present

## 2015-09-15 DIAGNOSIS — Z79899 Other long term (current) drug therapy: Secondary | ICD-10-CM | POA: Diagnosis not present

## 2015-09-15 DIAGNOSIS — J449 Chronic obstructive pulmonary disease, unspecified: Secondary | ICD-10-CM | POA: Diagnosis not present

## 2015-09-15 DIAGNOSIS — J45909 Unspecified asthma, uncomplicated: Secondary | ICD-10-CM | POA: Diagnosis not present

## 2015-09-15 DIAGNOSIS — L03115 Cellulitis of right lower limb: Secondary | ICD-10-CM | POA: Diagnosis not present

## 2015-09-15 DIAGNOSIS — I10 Essential (primary) hypertension: Secondary | ICD-10-CM | POA: Diagnosis not present

## 2015-09-15 DIAGNOSIS — Y999 Unspecified external cause status: Secondary | ICD-10-CM | POA: Diagnosis not present

## 2015-09-15 DIAGNOSIS — E119 Type 2 diabetes mellitus without complications: Secondary | ICD-10-CM | POA: Diagnosis not present

## 2015-09-15 DIAGNOSIS — I2510 Atherosclerotic heart disease of native coronary artery without angina pectoris: Secondary | ICD-10-CM | POA: Diagnosis not present

## 2015-09-15 DIAGNOSIS — M1990 Unspecified osteoarthritis, unspecified site: Secondary | ICD-10-CM | POA: Diagnosis not present

## 2015-09-15 DIAGNOSIS — X58XXXA Exposure to other specified factors, initial encounter: Secondary | ICD-10-CM | POA: Diagnosis not present

## 2015-09-15 LAB — CBC WITH DIFFERENTIAL/PLATELET
Basophils Absolute: 0 10*3/uL (ref 0.0–0.1)
Basophils Relative: 0 %
Eosinophils Absolute: 0.1 10*3/uL (ref 0.0–0.7)
Eosinophils Relative: 1 %
HEMATOCRIT: 39.2 % (ref 36.0–46.0)
Hemoglobin: 12.5 g/dL (ref 12.0–15.0)
LYMPHS ABS: 3 10*3/uL (ref 0.7–4.0)
Lymphocytes Relative: 35 %
MCH: 26 pg (ref 26.0–34.0)
MCHC: 31.9 g/dL (ref 30.0–36.0)
MCV: 81.5 fL (ref 78.0–100.0)
MONO ABS: 0.3 10*3/uL (ref 0.1–1.0)
MONOS PCT: 4 %
Neutro Abs: 5.1 10*3/uL (ref 1.7–7.7)
Neutrophils Relative %: 60 %
Platelets: 304 10*3/uL (ref 150–400)
RBC: 4.81 MIL/uL (ref 3.87–5.11)
RDW: 13.9 % (ref 11.5–15.5)
WBC: 8.6 10*3/uL (ref 4.0–10.5)

## 2015-09-15 LAB — BASIC METABOLIC PANEL
ANION GAP: 7 (ref 5–15)
BUN: 12 mg/dL (ref 6–20)
CALCIUM: 8.9 mg/dL (ref 8.9–10.3)
CHLORIDE: 105 mmol/L (ref 101–111)
CO2: 30 mmol/L (ref 22–32)
Creatinine, Ser: 0.8 mg/dL (ref 0.44–1.00)
GFR calc Af Amer: >60 mL/min (ref >60)
GFR calc non Af Amer: >60 mL/min (ref >60)
Glucose, Bld: 108 mg/dL — ABNORMAL HIGH (ref 65–99)
Potassium: 2.9 mmol/L — ABNORMAL LOW (ref 3.5–5.1)
Sodium: 142 mmol/L (ref 135–145)

## 2015-09-15 MED ORDER — SODIUM CHLORIDE 0.9 % IV SOLN
3.0000 g | Freq: Once | INTRAVENOUS | Status: AC
  Administered 2015-09-15: 3 g via INTRAVENOUS
  Filled 2015-09-15: qty 3

## 2015-09-15 MED ORDER — AMOXICILLIN-POT CLAVULANATE 500-125 MG PO TABS: 1.0000 | ORAL_TABLET | Freq: Three times a day (TID) | ORAL | Status: DC

## 2015-09-15 MED FILL — AMOX-CLAV 500-125 MG TABLET: 500-125 | 7 days supply | Qty: 21 | Fill #0

## 2015-09-15 NOTE — ED Notes (Signed)
Blisters to right lower leg for 8 days.  Pt denies injury.  Pt states it itches and has some pain, no burning.  Blisters have gotten larger over time.

## 2015-09-15 NOTE — ED Provider Notes (Signed)
CSN: [id number]     Arrival date & time 09/15/15  8657 History   First MD Initiated Contact with Patient 09/15/15 0957     Chief Complaint  Patient presents with  . Blister     (Consider location/radiation/quality/duration/timing/severity/associated sxs/prior Treatment) HPI Comments: Patient is a 75 year old female with history of coronary artery disease with stent, type 2 diabetes, hypertension. She presents for evaluation of blisters to her right lower leg that started several days ago and are worsening. She denies any injury or trauma. She denies any fevers or chills. She does report a similar episode in the past that was treated successfully with antibiotics.  The history is provided by the patient.    Past Medical History  Diagnosis Date  . Coronary artery disease   . Hypertension   . Asthma   . Shortness of breath   . Sleep apnea   . Diabetes mellitus without complication (HCC)     BORDERLINE  . GERD (gastroesophageal reflux disease)   . H/O hiatal hernia   . Arthritis   . Anemia   . COPD (chronic obstructive pulmonary disease) [facility])    Past Surgical History  Procedure Laterality Date  . Coronary angioplasty with stent placement  07/04/2012    mid LAD  . Tubal ligation    . Abdominal hysterectomy      partial  . Left and right heart catheterization with coronary angiogram N/A 07/04/2012    Procedure: LEFT AND RIGHT HEART CATHETERIZATION WITH CORONARY ANGIOGRAM;  Surgeon: Pamella Pert, MD;  Location: [facility] CATH LAB;  Service: Cardiovascular;  Laterality: N/A;  . Percutaneous coronary stent intervention (pci-s)  07/04/2012    Procedure: PERCUTANEOUS CORONARY STENT INTERVENTION (PCI-S);  Surgeon: Pamella Pert, MD;  Location: [facility] CATH LAB;  Service: Cardiovascular;;  . Left heart catheterization with coronary angiogram N/A 10/17/2012    Procedure: LEFT HEART CATHETERIZATION WITH CORONARY ANGIOGRAM;  Surgeon: Pamella Pert, MD;  Location: [facility] CATH LAB;  Service:  Cardiovascular;  Laterality: N/A;   Family History  Problem Relation Age of Onset  . Hyperlipidemia Sister   . Heart attack Sister   . Diabetes Daughter    Social History  Substance Use Topics  . Smoking status: Never Smoker   . Smokeless tobacco: Never Used  . Alcohol Use: No   OB History    No data available     Review of Systems  All other systems reviewed and are negative.     Allergies  Ciprofloxacin and Erythromycin  Home Medications   Prior to Admission medications   Medication Sig Start Date End Date Taking? Authorizing Provider  albuterol (PROVENTIL HFA;VENTOLIN HFA) 108 (90 BASE) MCG/ACT inhaler Inhale 2 puffs into the lungs 2 (two) times daily as needed for wheezing or shortness of breath.    Historical Provider, MD  amLODipine-valsartan (EXFORGE) 10-320 MG per tablet Take 1 tablet by mouth every morning.    Historical Provider, MD  aspirin EC 81 MG tablet Take 81 mg by mouth daily.    Historical Provider, MD  atorvastatin (LIPITOR) 80 MG tablet Take 80 mg by mouth daily.    Historical Provider, MD  AZOR 10-20 MG per tablet Take 1 tablet by mouth daily. 10/07/14   Historical Provider, MD  b complex vitamins tablet Take 1 tablet by mouth daily.    Historical Provider, MD  budesonide-formoterol (SYMBICORT) 160-4.5 MCG/ACT inhaler Inhale 2 puffs into the lungs daily.    Historical Provider, MD  Calcium Carbonate-Vitamin D (CALCIUM +  D PO) Take 1 tablet by mouth daily.    Historical Provider, MD  clobetasol cream (TEMOVATE) 0.05 % Apply 1 application topically daily. Apply to rash.    Historical Provider, MD  Cyanocobalamin (VITAMIN B-12 PO) Take 1 tablet by mouth daily.     Historical Provider, MD  EPINEPHrine (EPI-PEN) 0.3 mg/0.3 mL SOAJ Inject 0.3 mg into the muscle once as needed (for allergic reaction to pollen).    Historical Provider, MD  gabapentin (NEURONTIN) 300 MG capsule Take 300 mg by mouth at bedtime.    Historical Provider, MD  hydrALAZINE (APRESOLINE)  50 MG tablet Take 50 mg by mouth 2 (two) times daily.     Historical Provider, MD  HYDROcodone-acetaminophen (NORCO/VICODIN) 5-325 MG per tablet Take 1 tablet by mouth every 6 (six) hours as needed for pain.    Historical Provider, MD  hydrOXYzine (VISTARIL) 50 MG capsule Take 50 mg by mouth 4 (four) times daily as needed for itching.    Historical Provider, MD  mirtazapine (REMERON) 15 MG tablet Take 15 mg by mouth as needed.  08/15/12   Historical Provider, MD  mirtazapine (REMERON) 15 MG tablet Take 15 mg by mouth at bedtime as needed (for sleep).    Historical Provider, MD  nebivolol (BYSTOLIC) 10 MG tablet Take 10 mg by mouth daily.    Historical Provider, MD  omeprazole (PRILOSEC) 20 MG capsule Take 20 mg by mouth daily.    Historical Provider, MD  pantoprazole (PROTONIX) 40 MG tablet Take 40 mg by mouth daily.     Historical Provider, MD  Polyvinyl Alcohol-Povidone (REFRESH OP) Place 1 drop into both eyes daily as needed (for dry eyes).    Historical Provider, MD  promethazine (PHENERGAN) 25 MG tablet Take 25 mg by mouth every 6 (six) hours as needed for nausea.    Historical Provider, MD  tiotropium (SPIRIVA) 18 MCG inhalation capsule Place 18 mcg into inhaler and inhale daily.    Historical Provider, MD   BP 145/77 mmHg  Pulse 81  Temp(Src) 98.2 F (36.8 C) (Oral)  Resp 18  Ht 5\' 8"  (1.727 m)  Wt 272 lb (123.378 kg)  BMI 41.37 kg/m2  SpO2 97% Physical Exam  Constitutional: She is oriented to person, place, and time. She appears well-developed and well-nourished. No distress.  HENT:  Head: Normocephalic and atraumatic.  Neck: Normal range of motion. Neck supple.  Cardiovascular: Normal rate and regular rhythm.  Exam reveals no gallop and no friction rub.   No murmur heard. Pulmonary/Chest: Effort normal and breath sounds normal. No respiratory distress. She has no wheezes.  Abdominal: Soft. Bowel sounds are normal. She exhibits no distension. There is no tenderness.   Musculoskeletal: Normal range of motion.  Neurological: She is alert and oriented to person, place, and time.  Skin: Skin is warm and dry. She is not diaphoretic.  There are three quarter-sized blisters noted to the anterior aspect of the right lower leg. Clear yellow fluid is able to be expressed. There is slight surrounding erythema and warmth. DP pulses are palpable distally and motor and sensation are intact to the foot.  Nursing note and vitals reviewed.   ED Course  Procedures (including critical care time) Labs Review Labs Reviewed  BASIC METABOLIC PANEL  CBC WITH DIFFERENTIAL/PLATELET    Imaging Review No results found. I have personally reviewed and evaluated these images and lab results as part of my medical decision-making.    MDM   Final diagnoses:  None  Patient has blistering to the right leg but I suspect is related to an infection, possibly erysipelas. She was given IV Unasyn and will be discharged with Augmentin. To return as needed if symptoms worsen. She has no fever, no white count and is nontoxic-appearing and I believe appropriate for outpatient therapy.    Geoffery Lyonsouglas Karene Bracken, MD 09/15/15 [phone]

## 2015-09-15 NOTE — Discharge Instructions (Signed)
Augmentin as prescribed.  Return to the emergency department if you develop worsening blistering, redness, high fever, or other new and concerning symptoms.   Cellulitis Cellulitis is an infection of the skin and the tissue beneath it. The infected area is usually red and tender. Cellulitis occurs most often in the arms and lower legs.  CAUSES  Cellulitis is caused by bacteria that enter the skin through cracks or cuts in the skin. The most common types of bacteria that cause cellulitis are staphylococci and streptococci. SIGNS AND SYMPTOMS   Redness and warmth.  Swelling.  Tenderness or pain.  Fever. DIAGNOSIS  Your health care provider can usually determine what is wrong based on a physical exam. Blood tests may also be done. TREATMENT  Treatment usually involves taking an antibiotic medicine. HOME CARE INSTRUCTIONS   Take your antibiotic medicine as directed by your health care provider. Finish the antibiotic even if you start to feel better.  Keep the infected arm or leg elevated to reduce swelling.  Apply a warm cloth to the affected area up to 4 times per day to relieve pain.  Take medicines only as directed by your health care provider.  Keep all follow-up visits as directed by your health care provider. SEEK MEDICAL CARE IF:   You notice red streaks coming from the infected area.  Your red area gets larger or turns dark in color.  Your bone or joint underneath the infected area becomes painful after the skin has healed.  Your infection returns in the same area or another area.  You notice a swollen bump in the infected area.  You develop new symptoms.  You have a fever. SEEK IMMEDIATE MEDICAL CARE IF:   You feel very sleepy.  You develop vomiting or diarrhea.  You have a general ill feeling (malaise) with muscle aches and pains.   This information is not intended to replace advice given to you by your health care provider. Make sure you discuss any  questions you have with your health care provider.   Document Released: 12/09/2004 Document Revised: 11/20/2014 Document Reviewed: 05/17/2011 Elsevier Interactive Patient Education [employer]2016 Elsevier Inc.

## 2015-10-16 DIAGNOSIS — J45909 Unspecified asthma, uncomplicated: Secondary | ICD-10-CM | POA: Diagnosis not present

## 2015-10-27 DIAGNOSIS — E559 Vitamin D deficiency, unspecified: Secondary | ICD-10-CM | POA: Diagnosis not present

## 2015-10-27 DIAGNOSIS — I10 Essential (primary) hypertension: Secondary | ICD-10-CM | POA: Diagnosis not present

## 2015-11-06 ENCOUNTER — Ambulatory Visit: Payer: Medicare Other | Admitting: Family

## 2015-11-06 ENCOUNTER — Other Ambulatory Visit (HOSPITAL_COMMUNITY): Payer: Medicare Other

## 2015-11-07 DIAGNOSIS — J302 Other seasonal allergic rhinitis: Secondary | ICD-10-CM | POA: Diagnosis not present

## 2015-11-07 DIAGNOSIS — R609 Edema, unspecified: Secondary | ICD-10-CM | POA: Diagnosis not present

## 2015-11-07 DIAGNOSIS — M25512 Pain in left shoulder: Secondary | ICD-10-CM | POA: Diagnosis not present

## 2015-11-07 DIAGNOSIS — H9202 Otalgia, left ear: Secondary | ICD-10-CM | POA: Diagnosis not present

## 2015-11-07 DIAGNOSIS — E1151 Type 2 diabetes mellitus with diabetic peripheral angiopathy without gangrene: Secondary | ICD-10-CM | POA: Diagnosis not present

## 2015-11-07 DIAGNOSIS — I10 Essential (primary) hypertension: Secondary | ICD-10-CM | POA: Diagnosis not present

## 2015-11-16 DIAGNOSIS — J45909 Unspecified asthma, uncomplicated: Secondary | ICD-10-CM | POA: Diagnosis not present

## 2015-11-21 ENCOUNTER — Encounter: Payer: Self-pay | Admitting: Family

## 2015-11-28 ENCOUNTER — Ambulatory Visit (HOSPITAL_COMMUNITY): Payer: Medicare Other

## 2015-11-28 ENCOUNTER — Ambulatory Visit: Payer: Medicare Other | Admitting: Family

## 2015-12-03 ENCOUNTER — Observation Stay (HOSPITAL_BASED_OUTPATIENT_CLINIC_OR_DEPARTMENT_OTHER)
Admission: EM | Admit: 2015-12-03 | Discharge: 2015-12-04 | Disposition: A | Discharge status: Home / Self Care | Payer: Medicare Other | Attending: Internal Medicine | Admitting: Internal Medicine

## 2015-12-03 ENCOUNTER — Encounter (HOSPITAL_BASED_OUTPATIENT_CLINIC_OR_DEPARTMENT_OTHER): Payer: Self-pay

## 2015-12-03 ENCOUNTER — Emergency Department (HOSPITAL_BASED_OUTPATIENT_CLINIC_OR_DEPARTMENT_OTHER): Payer: Medicare Other

## 2015-12-03 DIAGNOSIS — I2510 Atherosclerotic heart disease of native coronary artery without angina pectoris: Secondary | ICD-10-CM | POA: Diagnosis not present

## 2015-12-03 DIAGNOSIS — J45909 Unspecified asthma, uncomplicated: Secondary | ICD-10-CM | POA: Diagnosis not present

## 2015-12-03 DIAGNOSIS — J449 Chronic obstructive pulmonary disease, unspecified: Secondary | ICD-10-CM | POA: Insufficient documentation

## 2015-12-03 DIAGNOSIS — R05 Cough: Secondary | ICD-10-CM | POA: Diagnosis not present

## 2015-12-03 DIAGNOSIS — R079 Chest pain, unspecified: Secondary | ICD-10-CM

## 2015-12-03 DIAGNOSIS — R0789 Other chest pain: Principal | ICD-10-CM | POA: Diagnosis present

## 2015-12-03 DIAGNOSIS — E7800 Pure hypercholesterolemia, unspecified: Secondary | ICD-10-CM | POA: Diagnosis present

## 2015-12-03 DIAGNOSIS — Z9861 Coronary angioplasty status: Secondary | ICD-10-CM

## 2015-12-03 DIAGNOSIS — E785 Hyperlipidemia, unspecified: Secondary | ICD-10-CM | POA: Diagnosis present

## 2015-12-03 DIAGNOSIS — Z955 Presence of coronary angioplasty implant and graft: Secondary | ICD-10-CM | POA: Insufficient documentation

## 2015-12-03 DIAGNOSIS — G609 Hereditary and idiopathic neuropathy, unspecified: Secondary | ICD-10-CM | POA: Diagnosis present

## 2015-12-03 DIAGNOSIS — I10 Essential (primary) hypertension: Secondary | ICD-10-CM | POA: Diagnosis not present

## 2015-12-03 DIAGNOSIS — R1013 Epigastric pain: Secondary | ICD-10-CM

## 2015-12-03 DIAGNOSIS — E1159 Type 2 diabetes mellitus with other circulatory complications: Secondary | ICD-10-CM | POA: Diagnosis present

## 2015-12-03 DIAGNOSIS — E119 Type 2 diabetes mellitus without complications: Secondary | ICD-10-CM | POA: Diagnosis not present

## 2015-12-03 HISTORY — DX: Type 2 diabetes mellitus with other circulatory complications: E11.59

## 2015-12-03 HISTORY — DX: Other chest pain: R07.89

## 2015-12-03 LAB — TROPONIN I
Troponin I: <0.03 ng/mL (ref <0.03)
Troponin I: <0.03 ng/mL (ref <0.03)

## 2015-12-03 LAB — CBC WITH DIFFERENTIAL/PLATELET
Basophils Absolute: 0 10*3/uL (ref 0.0–0.1)
Basophils Relative: 0 %
Eosinophils Absolute: 0.2 10*3/uL (ref 0.0–0.7)
Eosinophils Relative: 2 %
HEMATOCRIT: 39.1 % (ref 36.0–46.0)
Hemoglobin: 12.4 g/dL (ref 12.0–15.0)
LYMPHS ABS: 4 10*3/uL (ref 0.7–4.0)
LYMPHS PCT: 42 %
MCH: 26.1 pg (ref 26.0–34.0)
MCHC: 31.7 g/dL (ref 30.0–36.0)
MCV: 82.1 fL (ref 78.0–100.0)
MONO ABS: 0.4 10*3/uL (ref 0.1–1.0)
MONOS PCT: 5 %
NEUTROS ABS: 4.8 10*3/uL (ref 1.7–7.7)
Neutrophils Relative %: 51 %
Platelets: 329 10*3/uL (ref 150–400)
RBC: 4.76 MIL/uL (ref 3.87–5.11)
RDW: 14.7 % (ref 11.5–15.5)
WBC: 9.4 10*3/uL (ref 4.0–10.5)

## 2015-12-03 LAB — COMPREHENSIVE METABOLIC PANEL
ALBUMIN: 3.8 g/dL (ref 3.5–5.0)
ALT: 15 U/L (ref 14–54)
AST: 16 U/L (ref 15–41)
Alkaline Phosphatase: 39 U/L (ref 38–126)
Anion gap: 7 (ref 5–15)
BUN: 9 mg/dL (ref 6–20)
CO2: 33 mmol/L — AB (ref 22–32)
Calcium: 9.5 mg/dL (ref 8.9–10.3)
Chloride: 104 mmol/L (ref 101–111)
Creatinine, Ser: 0.86 mg/dL (ref 0.44–1.00)
GFR calc Af Amer: >60 mL/min (ref >60)
GFR calc non Af Amer: >60 mL/min (ref >60)
GLUCOSE: 79 mg/dL (ref 65–99)
POTASSIUM: 3.2 mmol/L — AB (ref 3.5–5.1)
SODIUM: 144 mmol/L (ref 135–145)
Total Bilirubin: 0.6 mg/dL (ref 0.3–1.2)
Total Protein: 7.1 g/dL (ref 6.5–8.1)

## 2015-12-03 LAB — URINALYSIS, ROUTINE W REFLEX MICROSCOPIC
Bilirubin Urine: NEGATIVE
GLUCOSE, UA: NEGATIVE mg/dL
HGB URINE DIPSTICK: NEGATIVE
Ketones, ur: NEGATIVE mg/dL
Leukocytes, UA: NEGATIVE
Nitrite: NEGATIVE
Protein, ur: NEGATIVE mg/dL
SPECIFIC GRAVITY, URINE: 1.014 (ref 1.005–1.030)
pH: 5 (ref 5.0–8.0)

## 2015-12-03 LAB — MAGNESIUM: MAGNESIUM: 1.8 mg/dL (ref 1.7–2.4)

## 2015-12-03 LAB — BRAIN NATRIURETIC PEPTIDE: B Natriuretic Peptide: 22.9 pg/mL (ref 0.0–100.0)

## 2015-12-03 MED ORDER — ATORVASTATIN CALCIUM 80 MG PO TABS
80.0000 mg | ORAL_TABLET | Freq: Every day | ORAL | Status: DC
  Administered 2015-12-04: 80 mg via ORAL
  Filled 2015-12-03: qty 1

## 2015-12-03 MED ORDER — IRBESARTAN 150 MG PO TABS
150.0000 mg | ORAL_TABLET | Freq: Every day | ORAL | Status: DC
  Administered 2015-12-04: 150 mg via ORAL
  Filled 2015-12-03: qty 1

## 2015-12-03 MED ORDER — ASPIRIN EC 325 MG PO TBEC
325.0000 mg | DELAYED_RELEASE_TABLET | Freq: Once | ORAL | Status: AC
  Administered 2015-12-03: 325 mg via ORAL
  Filled 2015-12-03: qty 1

## 2015-12-03 MED ORDER — HYDROCODONE-ACETAMINOPHEN 5-325 MG PO TABS: 1.0000 | ORAL_TABLET | Freq: Four times a day (QID) | ORAL | Status: DC | PRN

## 2015-12-03 MED ORDER — POTASSIUM CHLORIDE CRYS ER 20 MEQ PO TBCR
40.0000 meq | EXTENDED_RELEASE_TABLET | Freq: Once | ORAL | Status: AC
  Administered 2015-12-03: 40 meq via ORAL
  Filled 2015-12-03: qty 2

## 2015-12-03 MED ORDER — ACETAMINOPHEN 325 MG PO TABS
650.0000 mg | ORAL_TABLET | ORAL | Status: DC | PRN
  Administered 2015-12-04: 650 mg via ORAL
  Filled 2015-12-03: qty 2

## 2015-12-03 MED ORDER — NEBIVOLOL HCL 5 MG PO TABS: 10.0000 mg | ORAL_TABLET | Freq: Every day | ORAL | Status: DC

## 2015-12-03 MED ORDER — AMLODIPINE-OLMESARTAN 10-20 MG PO TABS: 1.0000 | ORAL_TABLET | Freq: Every day | ORAL | Status: DC

## 2015-12-03 MED ORDER — ONDANSETRON HCL 4 MG/2ML IJ SOLN: 4.0000 mg | Freq: Four times a day (QID) | INTRAMUSCULAR | Status: DC | PRN

## 2015-12-03 MED ORDER — PANTOPRAZOLE SODIUM 40 MG PO TBEC
40.0000 mg | DELAYED_RELEASE_TABLET | Freq: Every day | ORAL | Status: DC
  Administered 2015-12-04: 40 mg via ORAL
  Filled 2015-12-03: qty 1

## 2015-12-03 MED ORDER — ENOXAPARIN SODIUM 40 MG/0.4ML ~~LOC~~ SOLN: 40.0000 mg | Freq: Every day | SUBCUTANEOUS | Status: DC

## 2015-12-03 MED ORDER — CLOBETASOL PROPIONATE 0.05 % EX CREA
1.0000 "application " | TOPICAL_CREAM | Freq: Every day | CUTANEOUS | Status: DC
  Filled 2015-12-03: qty 15

## 2015-12-03 MED ORDER — PANTOPRAZOLE SODIUM 40 MG PO TBEC
40.0000 mg | DELAYED_RELEASE_TABLET | Freq: Every day | ORAL | Status: DC
Start: 2015-12-04 — End: 2015-12-03

## 2015-12-03 MED ORDER — ALBUTEROL SULFATE (2.5 MG/3ML) 0.083% IN NEBU: 2.5000 mg | INHALATION_SOLUTION | Freq: Two times a day (BID) | RESPIRATORY_TRACT | Status: DC | PRN

## 2015-12-03 MED ORDER — INSULIN ASPART 100 UNIT/ML ~~LOC~~ SOLN
0.0000 [IU] | Freq: Three times a day (TID) | SUBCUTANEOUS | Status: DC
  Administered 2015-12-04: 2 [IU] via SUBCUTANEOUS

## 2015-12-03 MED ORDER — TIOTROPIUM BROMIDE MONOHYDRATE 18 MCG IN CAPS
18.0000 ug | ORAL_CAPSULE | Freq: Every day | RESPIRATORY_TRACT | Status: DC
  Filled 2015-12-03: qty 5

## 2015-12-03 MED ORDER — MIRTAZAPINE 7.5 MG PO TABS: 15.0000 mg | ORAL_TABLET | Freq: Every evening | ORAL | Status: DC | PRN

## 2015-12-03 MED ORDER — HYDROXYZINE HCL 25 MG PO TABS: 50.0000 mg | ORAL_TABLET | Freq: Four times a day (QID) | ORAL | Status: DC | PRN

## 2015-12-03 MED ORDER — VITAMIN B-12 1000 MCG PO TABS
1000.0000 ug | ORAL_TABLET | Freq: Every day | ORAL | Status: DC
  Administered 2015-12-04: 1000 ug via ORAL
  Filled 2015-12-03: qty 1

## 2015-12-03 MED ORDER — HYDRALAZINE HCL 50 MG PO TABS
100.0000 mg | ORAL_TABLET | Freq: Two times a day (BID) | ORAL | Status: DC
  Filled 2015-12-03 (×2): qty 2

## 2015-12-03 MED ORDER — ASPIRIN EC 81 MG PO TBEC
81.0000 mg | DELAYED_RELEASE_TABLET | Freq: Every day | ORAL | Status: DC
  Administered 2015-12-04: 81 mg via ORAL
  Filled 2015-12-03: qty 1

## 2015-12-03 MED ORDER — GABAPENTIN 300 MG PO CAPS
300.0000 mg | ORAL_CAPSULE | Freq: Every day | ORAL | Status: DC
  Administered 2015-12-04: 300 mg via ORAL
  Filled 2015-12-03: qty 1

## 2015-12-03 MED ORDER — AMLODIPINE BESYLATE 10 MG PO TABS
10.0000 mg | ORAL_TABLET | Freq: Every day | ORAL | Status: DC
  Administered 2015-12-04: 10 mg via ORAL
  Filled 2015-12-03: qty 1

## 2015-12-03 MED ORDER — B COMPLEX-C PO TABS
1.0000 | ORAL_TABLET | Freq: Every day | ORAL | Status: DC
  Administered 2015-12-04: 1 via ORAL
  Filled 2015-12-03: qty 1

## 2015-12-03 MED ORDER — MOMETASONE FURO-FORMOTEROL FUM 200-5 MCG/ACT IN AERO
2.0000 | INHALATION_SPRAY | Freq: Two times a day (BID) | RESPIRATORY_TRACT | Status: DC
  Administered 2015-12-04: 2 via RESPIRATORY_TRACT
  Filled 2015-12-03: qty 8.8

## 2015-12-03 MED ORDER — CALCIUM CARBONATE-VITAMIN D 500-200 MG-UNIT PO TABS
1.0000 | ORAL_TABLET | Freq: Every day | ORAL | Status: DC
  Administered 2015-12-04: 1 via ORAL
  Filled 2015-12-03: qty 1

## 2015-12-03 NOTE — ED Triage Notes (Signed)
CP started yesterday-NAD-steady gait 

## 2015-12-03 NOTE — ED Notes (Signed)
Patient transported to X-ray 

## 2015-12-03 NOTE — ED Provider Notes (Signed)
Emergency Department Provider Note   I have reviewed the triage vital signs and the nursing notes.   HISTORY  Chief Complaint Chest Pain   HPI Angie Hill is a 75 y.o. female with PMH of COPD, CAD, DM, HLD, HTN presents to the emergency department for evaluation of left-sided chest pressure over the past 3 days. Patient describes it as a pressure or "fullness." She notes that she has an associated dull headache and attributed initially to sinus infection. She denies fever, productive cough, vomiting, diarrhea. She reports a prior history of PCI and states that the pressure feels somewhat similar to that event. She also notes that she has frequent buildup of fluid and has noted her legs to be a little bit more swollen. She has been compliant with her "fluid pill." She denies associated dyspnea. No known sick contacts. She has tried antacid medications with no relief.   Past Medical History:  Diagnosis Date  . Anemia   . Arthritis   . Asthma   . COPD (chronic obstructive pulmonary disease) (HCC)   . Coronary artery disease   . Diabetes mellitus without complication (HCC)    BORDERLINE  . GERD (gastroesophageal reflux disease)   . H/O hiatal hernia   . Hypertension   . Shortness of breath   . Sleep apnea     Patient Active Problem List   Diagnosis Date Noted  . Chest pain 12/03/2015  . Onychomycosis 09/01/2012  . Pain in joint, ankle and foot 09/01/2012  . Dermatitis 09/01/2012  . Unspecified hereditary and idiopathic peripheral neuropathy 09/01/2012  . S/P PTCA (percutaneous transluminal coronary angioplasty) 07/05/2012  . Essential hypertension 07/05/2012  . Hyperlipidemia 07/05/2012    Past Surgical History:  Procedure Laterality Date  . ABDOMINAL HYSTERECTOMY     partial  . CORONARY ANGIOPLASTY WITH STENT PLACEMENT  07/04/2012   mid LAD  . LEFT AND RIGHT HEART CATHETERIZATION WITH CORONARY ANGIOGRAM N/A 07/04/2012   Procedure: LEFT AND RIGHT HEART  CATHETERIZATION WITH CORONARY ANGIOGRAM;  Surgeon: Pamella PertJagadeesh R Ganji, MD;  [facility];  Service: Cardiovascular;  Laterality: N/A;  . LEFT HEART CATHETERIZATION WITH CORONARY ANGIOGRAM N/A 10/17/2012   Procedure: LEFT HEART CATHETERIZATION WITH CORONARY ANGIOGRAM;  Surgeon: Pamella PertJagadeesh R Ganji, MD;  [facility];  Service: Cardiovascular;  Laterality: N/A;  . PERCUTANEOUS CORONARY STENT INTERVENTION (PCI-S)  07/04/2012   Procedure: PERCUTANEOUS CORONARY STENT INTERVENTION (PCI-S);  Surgeon: Pamella PertJagadeesh R Ganji, MD;  [facility];  Service: Cardiovascular;;  . TUBAL LIGATION      Current Outpatient Rx  . Order #: [id number] Class: Historical Med  . Order #: [id number] Class: Historical Med  . Order #: [id number] Class: Print  . Order #: [id number] Class: Historical Med  . Order #: [id number] Class: Historical Med  . Order #: [id number] Class: Historical Med  . Order #: [id number] Class: Historical Med  . Order #: [id number] Class: Historical Med  . Order #: [id number] Class: Historical Med  . Order #: [id number] Class: Historical Med  . Order #: [id number] Class: Historical Med  . Order #: [id number] Class: Historical Med  . Order #: [id number] Class: Historical Med  . Order #: [id number] Class: Historical Med  . Order #: [id number] Class: Historical Med  . Order #: [id number] Class: Historical Med  . Order #: [id number] Class: Historical Med  . Order #: [id number] Class: Historical Med  . Order #: [id number] Class: Historical Med  . Order #: [id number] Class: Historical Med  . Order #: [id number] Class: Historical Med  . Order #: [id number] Class: Historical Med  . Order #: [id number] Class:  Historical Med  . Order #: [id number] Class: Historical Med    Allergies Ciprofloxacin and Erythromycin  Family History  Problem Relation Age of Onset  . Hyperlipidemia Sister   . Heart attack Sister   . Diabetes Daughter     Social History Social History  Substance Use Topics  . Smoking status: Never Smoker  . Smokeless  tobacco: Never Used  . Alcohol use No    Review of Systems  Constitutional: No fever/chills Eyes: No visual changes. ENT: No sore throat. Cardiovascular: Positive chest pain. Respiratory: Denies shortness of breath. Gastrointestinal: No abdominal pain. No nausea, no vomiting. No diarrhea. No constipation. Genitourinary: Negative for dysuria. Musculoskeletal: Negative for back pain. Skin: Negative for rash. Neurological: Negative for headaches, focal weakness or numbness.  10-point ROS otherwise negative.  ____________________________________________   PHYSICAL EXAM:  VITAL SIGNS: ED Triage Vitals  Enc Vitals Group     BP 12/03/15 1418 145/78     Pulse Rate 12/03/15 1418 73     Resp 12/03/15 1418 20     Temp 12/03/15 1418 98.1 F (36.7 C)     Temp Source 12/03/15 1418 Oral     SpO2 12/03/15 1418 98 %     Weight 12/03/15 1418 270 lb (122.5 kg)     Height 12/03/15 1418 5\' 8"  (1.727 m)     Pain Score 12/03/15 1416 7   Constitutional: Alert and oriented. Well appearing and in no acute distress. Eyes: Conjunctivae are normal.  Head: Atraumatic. Nose: No congestion/rhinnorhea. Mouth/Throat: Mucous membranes are moist.  Oropharynx non-erythematous. Neck: No stridor.  Cardiovascular: Normal rate, regular rhythm. Good peripheral circulation. Grossly normal heart sounds.   Respiratory: Normal respiratory effort.  No retractions. Lungs CTAB. Gastrointestinal: Soft and nontender. No distention.  Musculoskeletal: No lower extremity tenderness nor edema. No gross deformities of extremities. Neurologic:  Normal speech and language. No gross focal neurologic deficits are appreciated.  Skin:  Skin is warm, dry and intact. No rash noted. Psychiatric: Mood and affect are normal. Speech and behavior are normal  ____________________________________________   LABS (all labs ordered are listed, but only abnormal results are displayed)  Labs Reviewed  COMPREHENSIVE METABOLIC PANEL -  Abnormal; Notable for the following:       Result Value   Potassium 3.2 (*)    CO2 33 (*)    All other components within normal limits  URINALYSIS, ROUTINE W REFLEX MICROSCOPIC (NOT AT [facility]) - Abnormal; Notable for the following:    APPearance CLOUDY (*)    All other components within normal limits  CBC WITH DIFFERENTIAL/PLATELET  TROPONIN I  BRAIN NATRIURETIC PEPTIDE  MAGNESIUM  TROPONIN I   ____________________________________________  EKG   EKG Interpretation  Date/Time:  Wednesday December 03 2015 14:22:11 EDT Ventricular Rate:  74 PR Interval:  222 QRS Duration: 90 QT Interval:  398 QTC Calculation: 441 R Axis:   33 Text Interpretation:  Sinus rhythm with 1st degree A-V block Nonspecific T wave abnormality Abnormal ECG No STEMI. Compared to prior tracing.  Confirmed by Christian Borgerding MD, Shelbe Haglund [phone]) on 12/03/2015 3:32:31 PM       ____________________________________________  RADIOLOGY  Dg Chest 2 View  Result Date: 12/03/2015 CLINICAL DATA:  Chest pain and chest congestion with the productive cough. History of COPD. EXAM: CHEST  2 VIEW COMPARISON:  09/14/2014 FINDINGS: Chronic borderline cardiomegaly. Tortuosity of the thoracic aorta. Chronic prominence of the right pulmonary artery. No infiltrates or effusions. No acute osseous abnormality. Coronary artery stents noted. IMPRESSION: No acute abnormalities.  Electronically Signed   By: Francene Boyers M.D.   On: 12/03/2015 16:04    ____________________________________________   PROCEDURES  Procedure(s) performed:   Procedures  None ____________________________________________   INITIAL IMPRESSION / ASSESSMENT AND PLAN / ED COURSE  Pertinent labs & imaging results that were available during my care of the patient were reviewed by me and considered in my medical decision making (see chart for details).  Patient presents to the emergency department for evaluation of chest discomfort, mild headache with multiple risk  factors for ACS including prior PCI. Pain has been more or less constant for the past 3 days. Plan to initiate broad workup for ACS. Also considering volume overload, infection, GI source. Patient is in no acute distress.   Updated patient regarding labs and plan. No chest pain at this time. Given history, plan for chest pain observational admission. Patient in agreement. All questions answered.   Discussed patient's case with hospitalist, Dr. Robb Matar.  Recommend admission to observation, telemetry bed.  I will place holding orders per their request. Patient and family (if present) updated with plan. Care transferred to hospitalist service. I have added on a magnesium lab and ordered PO potassium replacement.   I reviewed all nursing notes, vitals, pertinent old records, EKGs, labs, imaging (as available).  ____________________________________________  FINAL CLINICAL IMPRESSION(S) / ED DIAGNOSES  Final diagnoses:  Nonspecific chest pain     MEDICATIONS GIVEN DURING THIS VISIT:  Medications  aspirin EC tablet 325 mg (325 mg Oral Given 12/03/15 1629)  potassium chloride SA (K-DUR,KLOR-CON) CR tablet 40 mEq (40 mEq Oral Given 12/03/15 1836)     NEW OUTPATIENT MEDICATIONS STARTED DURING THIS VISIT:  None   Note:  This document was prepared using Dragon voice recognition software and may include unintentional dictation errors.  Alona Bene, MD Emergency Medicine    Maia Plan, MD 12/03/15 2108

## 2015-12-03 NOTE — Progress Notes (Signed)
Patient ID: Angie Hill, female   DOB: [birth year], 75 y.o.   MRN: [id number] Accepted to telemetry observation bed for chest pain evaluation.   Please call the floor manager at extension [phone] for admitting physician assignment upon patient's arrival to the floor.  75 yo female with a PMH of CAD, COPD, asthma, osteoarthritis, anemia, type 2 diabetes, GERD, hiatal hernia, hypertension, sleep apnea who [facility]. Troponin level was initially negative, EKG shows nonspecific T-wave abnormalities. Lab work has been unremarkable so far, except for a potassium level of 3.2 mmol/liter. Accepted to telemetry bed for overnight cardiac monitoring and troponin levels trending.   Sanda Kleinavid Whitman Meinhardt, M.D. [phone].

## 2015-12-03 NOTE — ED Triage Notes (Addendum)
Pt later stated she was having head/chest congestion, prod cough that started yesterday

## 2015-12-03 NOTE — H&P (Signed)
History and Physical    Angie Hill ZOX:[id number] DOB: 24-Feb-1941 DOA: 12/03/2015  PCP: Jackie Plum, MD  Patient coming from: Home.  Chief Complaint: Chest pain.  HPI: Angie Hill is a 75 y.o. female with CAD status post stenting in 2014, diabetes mellitus type 2, COPD, hypertension presents to the ER because of chest pain. Patient has been experiencing chest pain since yesterday morning mostly exertional. Patient's chest pain resolves after sitting down. Patient also has been having some epigastric discomfort and denies any vomiting or diarrhea. In the ER EKG chest x-ray and troponin were unremarkable. Given the history of CAD patient has been admitted for further observation and management for chest pain. Patient states she has been having some upper respiratory tract symptoms since yesterday with some running nose and frontal headache.  ED Course: Chest x-ray EKG and cardiac markers were negative.  Review of Systems: As per HPI, rest all negative.   Past Medical History:  Diagnosis Date  . Anemia   . Arthritis   . Asthma   . COPD (chronic obstructive pulmonary disease) (HCC)   . Coronary artery disease   . Diabetes mellitus without complication (HCC)    BORDERLINE  . GERD (gastroesophageal reflux disease)   . H/O hiatal hernia   . Hypertension   . Shortness of breath   . Sleep apnea     Past Surgical History:  Procedure Laterality Date  . ABDOMINAL HYSTERECTOMY     partial  . CORONARY ANGIOPLASTY WITH STENT PLACEMENT  07/04/2012   mid LAD  . LEFT AND RIGHT HEART CATHETERIZATION WITH CORONARY ANGIOGRAM N/A 07/04/2012   Procedure: LEFT AND RIGHT HEART CATHETERIZATION WITH CORONARY ANGIOGRAM;  Surgeon: Pamella Pert, MD;  Location: [facility] CATH LAB;  Service: Cardiovascular;  Laterality: N/A;  . LEFT HEART CATHETERIZATION WITH CORONARY ANGIOGRAM N/A 10/17/2012   Procedure: LEFT HEART CATHETERIZATION WITH CORONARY ANGIOGRAM;  Surgeon: Pamella Pert, MD;   Location: [facility] CATH LAB;  Service: Cardiovascular;  Laterality: N/A;  . PERCUTANEOUS CORONARY STENT INTERVENTION (PCI-S)  07/04/2012   Procedure: PERCUTANEOUS CORONARY STENT INTERVENTION (PCI-S);  Surgeon: Pamella Pert, MD;  Location: [facility] CATH LAB;  Service: Cardiovascular;;  . TUBAL LIGATION       reports that she has never smoked. She has never used smokeless tobacco. She reports that she does not drink alcohol or use drugs.  Allergies  Allergen Reactions  . Ciprofloxacin Itching and Rash  . Erythromycin Itching and Rash    Family History  Problem Relation Age of Onset  . Hyperlipidemia Sister   . Heart attack Sister   . Diabetes Daughter     Prior to Admission medications   Medication Sig Start Date End Date Taking? Authorizing Provider  albuterol (PROVENTIL HFA;VENTOLIN HFA) 108 (90 BASE) MCG/ACT inhaler Inhale 2 puffs into the lungs 2 (two) times daily as needed for wheezing or shortness of breath.    Historical Provider, MD  amLODipine-valsartan (EXFORGE) 10-320 MG per tablet Take 1 tablet by mouth every morning.    Historical Provider, MD  amoxicillin-clavulanate (AUGMENTIN) 500-125 MG tablet Take 1 tablet (500 mg total) by mouth every 8 (eight) hours. 09/15/15   Geoffery Lyons, MD  aspirin EC 81 MG tablet Take 81 mg by mouth daily.    Historical Provider, MD  atorvastatin (LIPITOR) 80 MG tablet Take 80 mg by mouth daily.    Historical Provider, MD  AZOR 10-20 MG per tablet Take 1 tablet by mouth daily. 10/07/14   Historical Provider,  MD  b complex vitamins tablet Take 1 tablet by mouth daily.    Historical Provider, MD  budesonide-formoterol (SYMBICORT) 160-4.5 MCG/ACT inhaler Inhale 2 puffs into the lungs daily.    Historical Provider, MD  Calcium Carbonate-Vitamin D (CALCIUM + D PO) Take 1 tablet by mouth daily.    Historical Provider, MD  clobetasol cream (TEMOVATE) 0.05 % Apply 1 application topically daily. Apply to rash.    Historical Provider, MD  Cyanocobalamin (VITAMIN  B-12 PO) Take 1 tablet by mouth daily.     Historical Provider, MD  EPINEPHrine (EPI-PEN) 0.3 mg/0.3 mL SOAJ Inject 0.3 mg into the muscle once as needed (for allergic reaction to pollen).    Historical Provider, MD  gabapentin (NEURONTIN) 300 MG capsule Take 300 mg by mouth at bedtime.    Historical Provider, MD  hydrALAZINE (APRESOLINE) 50 MG tablet Take 50 mg by mouth 2 (two) times daily.     Historical Provider, MD  HYDROcodone-acetaminophen (NORCO/VICODIN) 5-325 MG per tablet Take 1 tablet by mouth every 6 (six) hours as needed for pain.    Historical Provider, MD  hydrOXYzine (VISTARIL) 50 MG capsule Take 50 mg by mouth 4 (four) times daily as needed for itching.    Historical Provider, MD  mirtazapine (REMERON) 15 MG tablet Take 15 mg by mouth as needed.  08/15/12   Historical Provider, MD  mirtazapine (REMERON) 15 MG tablet Take 15 mg by mouth at bedtime as needed (for sleep).    Historical Provider, MD  nebivolol (BYSTOLIC) 10 MG tablet Take 10 mg by mouth daily.    Historical Provider, MD  omeprazole (PRILOSEC) 20 MG capsule Take 20 mg by mouth daily.    Historical Provider, MD  pantoprazole (PROTONIX) 40 MG tablet Take 40 mg by mouth daily.     Historical Provider, MD  Polyvinyl Alcohol-Povidone (REFRESH OP) Place 1 drop into both eyes daily as needed (for dry eyes).    Historical Provider, MD  promethazine (PHENERGAN) 25 MG tablet Take 25 mg by mouth every 6 (six) hours as needed for nausea.    Historical Provider, MD  tiotropium (SPIRIVA) 18 MCG inhalation capsule Place 18 mcg into inhaler and inhale daily.    Historical Provider, MD    Physical Exam: Vitals:   12/03/15 1949 12/03/15 2000 12/03/15 2106 12/03/15 2210  BP: (!) 160/103 158/89 156/80 (!) 147/70  Pulse: 78 76 76 75  Resp: 22 20 20 20   Temp: 98.1 F (36.7 C)   98.4 F (36.9 C)  TempSrc: Oral   Oral  SpO2: 98% 96% 98% 99%  Weight:      Height:          Constitutional: Not in distress. Vitals:   12/03/15 1949  12/03/15 2000 12/03/15 2106 12/03/15 2210  BP: (!) 160/103 158/89 156/80 (!) 147/70  Pulse: 78 76 76 75  Resp: 22 20 20 20   Temp: 98.1 F (36.7 C)   98.4 F (36.9 C)  TempSrc: Oral   Oral  SpO2: 98% 96% 98% 99%  Weight:      Height:       Eyes: Anicteric no pallor. ENMT: No discharge from the ears eyes nose or mouth. Neck: No mass felt. Respiratory: No rhonchi or crepitations. Cardiovascular: S1 and S2 heard. Abdomen: Mild epigastric tenderness no guarding or rigidity. Musculoskeletal: No edema. Skin: No rash. Neurologic: Alert awake oriented to time place and person. Moves all extremities. Psychiatric: Appears normal.   Labs on Admission: I have personally reviewed following  labs and imaging studies  CBC:  Recent Labs Lab 12/03/15 1615  WBC 9.4  NEUTROABS 4.8  HGB 12.4  HCT 39.1  MCV 82.1  PLT 329   Basic Metabolic Panel:  Recent Labs Lab 12/03/15 1615 12/03/15 1811  NA 144  --   K 3.2*  --   CL 104  --   CO2 33*  --   GLUCOSE 79  --   BUN 9  --   CREATININE 0.86  --   CALCIUM 9.5  --   MG  --  1.8   GFR: Estimated Creatinine Clearance: 79.1 mL/min (by C-G formula based on SCr of 0.86 mg/dL). Liver Function Tests:  Recent Labs Lab 12/03/15 1615  AST 16  ALT 15  ALKPHOS 39  BILITOT 0.6  PROT 7.1  ALBUMIN 3.8   No results for input(s): LIPASE, AMYLASE in the last 168 hours. No results for input(s): AMMONIA in the last 168 hours. Coagulation Profile: No results for input(s): INR, PROTIME in the last 168 hours. Cardiac Enzymes:  Recent Labs Lab 12/03/15 1615 12/03/15 2010  TROPONINI <0.03 <0.03   BNP (last 3 results) No results for input(s): PROBNP in the last 8760 hours. HbA1C: No results for input(s): HGBA1C in the last 72 hours. CBG: No results for input(s): GLUCAP in the last 168 hours. Lipid Profile: No results for input(s): CHOL, HDL, LDLCALC, TRIG, CHOLHDL, LDLDIRECT in the last 72 hours. Thyroid Function Tests: No results  for input(s): TSH, T4TOTAL, FREET4, T3FREE, THYROIDAB in the last 72 hours. Anemia Panel: No results for input(s): VITAMINB12, FOLATE, FERRITIN, TIBC, IRON, RETICCTPCT in the last 72 hours. Urine analysis:    Component Value Date/Time   COLORURINE YELLOW 12/03/2015 1630   APPEARANCEUR CLOUDY (A) 12/03/2015 1630   LABSPEC 1.014 12/03/2015 1630   PHURINE 5.0 12/03/2015 1630   GLUCOSEU NEGATIVE 12/03/2015 1630   HGBUR NEGATIVE 12/03/2015 1630   BILIRUBINUR NEGATIVE 12/03/2015 1630   KETONESUR NEGATIVE 12/03/2015 1630   PROTEINUR NEGATIVE 12/03/2015 1630   NITRITE NEGATIVE 12/03/2015 1630   LEUKOCYTESUR NEGATIVE 12/03/2015 1630   Sepsis Labs: @LABRCNTIP (procalcitonin:4,lacticidven:4) )No results found for this or any previous visit (from the past 240 hour(s)).   Radiological Exams on Admission: Dg Chest 2 View  Result Date: 12/03/2015 CLINICAL DATA:  Chest pain and chest congestion with the productive cough. History of COPD. EXAM: CHEST  2 VIEW COMPARISON:  09/14/2014 FINDINGS: Chronic borderline cardiomegaly. Tortuosity of the thoracic aorta. Chronic prominence of the right pulmonary artery. No infiltrates or effusions. No acute osseous abnormality. Coronary artery stents noted. IMPRESSION: No acute abnormalities. Electronically Signed   By: Francene BoyersJames  Maxwell M.D.   On: 12/03/2015 16:04    EKG: Independently reviewed. Normal sinus rhythm with first-degree AV block.  Assessment/Plan Principal Problem:   Chest pain Active Problems:   S/P PTCA (percutaneous transluminal coronary angioplasty)   Essential hypertension   Hereditary and idiopathic peripheral neuropathy   Type 2 diabetes mellitus with vascular disease (HCC)    1. Chest pain with history of CAD status post stenting - I have consulted cardiologist Dr. Jacinto HalimGanji, patient is known to Dr. Jacinto HalimGanji from previous cardiac cath in 2014. We'll cycle cardiac markers. Check 2-D echo continue beta blockers statins and aspirin. 2. Epigastric  discomfort - check LFTs and sonogram of the abdomen. 3. Hypertension - on hydralazine, Bystolic, Exforge. Patient's blood pressure is mildly uncontrolled. Closely follow blood pressure trends. 4. Diabetes mellitus type 2 - while inpatient keep patient on sliding scale coverage and  hold Amaryl. 5. COPD/asthma presently not wheezing.   DVT prophylaxis: Lovenox. Code Status: Full code.  Family Communication: Patient's granddaughter.  Disposition Plan: Home.  Consults called: Cardiology.  Admission status: Observation.    Eduard Clos MD [facility] Pager [phone].  If 7PM-7AM, please contact night-coverage [email or web address] Password [facility]  12/03/2015, 11:46 PM

## 2015-12-04 ENCOUNTER — Other Ambulatory Visit (HOSPITAL_COMMUNITY): Payer: Medicare Other

## 2015-12-04 ENCOUNTER — Observation Stay (HOSPITAL_COMMUNITY): Payer: Medicare Other

## 2015-12-04 ENCOUNTER — Other Ambulatory Visit: Payer: Self-pay

## 2015-12-04 DIAGNOSIS — R1013 Epigastric pain: Secondary | ICD-10-CM

## 2015-12-04 DIAGNOSIS — E119 Type 2 diabetes mellitus without complications: Secondary | ICD-10-CM | POA: Diagnosis not present

## 2015-12-04 DIAGNOSIS — R0789 Other chest pain: Principal | ICD-10-CM

## 2015-12-04 DIAGNOSIS — I25110 Atherosclerotic heart disease of native coronary artery with unstable angina pectoris: Secondary | ICD-10-CM | POA: Diagnosis not present

## 2015-12-04 DIAGNOSIS — I10 Essential (primary) hypertension: Secondary | ICD-10-CM

## 2015-12-04 DIAGNOSIS — I2510 Atherosclerotic heart disease of native coronary artery without angina pectoris: Secondary | ICD-10-CM | POA: Diagnosis not present

## 2015-12-04 DIAGNOSIS — J449 Chronic obstructive pulmonary disease, unspecified: Secondary | ICD-10-CM | POA: Diagnosis not present

## 2015-12-04 DIAGNOSIS — E1159 Type 2 diabetes mellitus with other circulatory complications: Secondary | ICD-10-CM | POA: Diagnosis not present

## 2015-12-04 LAB — MRSA PCR SCREENING: MRSA by PCR: NEGATIVE

## 2015-12-04 LAB — CBC
HEMATOCRIT: 39.7 % (ref 36.0–46.0)
Hemoglobin: 12.1 g/dL (ref 12.0–15.0)
MCH: 25.6 pg — AB (ref 26.0–34.0)
MCHC: 30.5 g/dL (ref 30.0–36.0)
MCV: 84.1 fL (ref 78.0–100.0)
PLATELETS: 319 10*3/uL (ref 150–400)
RBC: 4.72 MIL/uL (ref 3.87–5.11)
RDW: 14.4 % (ref 11.5–15.5)
WBC: 10.9 10*3/uL — ABNORMAL HIGH (ref 4.0–10.5)

## 2015-12-04 LAB — TROPONIN I
Troponin I: <0.03 ng/mL (ref <0.03)
Troponin I: <0.03 ng/mL (ref <0.03)

## 2015-12-04 LAB — CREATININE, SERUM
Creatinine, Ser: 0.88 mg/dL (ref 0.44–1.00)
GFR calc non Af Amer: >60 mL/min (ref >60)

## 2015-12-04 LAB — GLUCOSE, CAPILLARY
GLUCOSE-CAPILLARY: 182 mg/dL — AB (ref 65–99)
Glucose-Capillary: 95 mg/dL (ref 65–99)

## 2015-12-04 MED ORDER — NITROGLYCERIN 0.4 MG SL SUBL: 0.4000 mg | SUBLINGUAL_TABLET | SUBLINGUAL | 0 refills | Status: DC | PRN

## 2015-12-04 MED ORDER — HYDROCHLOROTHIAZIDE 25 MG PO TABS: 25.0000 mg | ORAL_TABLET | Freq: Every day | ORAL | 4 refills | Status: DC

## 2015-12-04 MED ORDER — NEBIVOLOL HCL 20 MG PO TABS: 20.0000 mg | ORAL_TABLET | Freq: Every day | ORAL | 3 refills | Status: DC

## 2015-12-04 MED ORDER — NEBIVOLOL HCL 5 MG PO TABS
20.0000 mg | ORAL_TABLET | Freq: Every day | ORAL | Status: DC
  Administered 2015-12-04: 20 mg via ORAL
  Filled 2015-12-04: qty 4

## 2015-12-04 MED ORDER — TRAMADOL HCL 50 MG PO TABS
50.0000 mg | ORAL_TABLET | Freq: Three times a day (TID) | ORAL | Status: DC
  Administered 2015-12-04: 50 mg via ORAL
  Filled 2015-12-04: qty 1

## 2015-12-04 MED ORDER — GI COCKTAIL ~~LOC~~: 30.0000 mL | Freq: Three times a day (TID) | ORAL | Status: DC | PRN

## 2015-12-04 MED ORDER — TRAMADOL HCL 50 MG PO TABS: 50.0000 mg | ORAL_TABLET | Freq: Four times a day (QID) | ORAL | 0 refills | Status: DC | PRN

## 2015-12-04 MED ORDER — MORPHINE SULFATE (PF) 2 MG/ML IV SOLN
0.5000 mg | Freq: Once | INTRAVENOUS | Status: AC
  Administered 2015-12-04: 0.5 mg via INTRAVENOUS
  Filled 2015-12-04: qty 1

## 2015-12-04 MED ORDER — NITROGLYCERIN 0.4 MG SL SUBL
0.4000 mg | SUBLINGUAL_TABLET | SUBLINGUAL | Status: DC | PRN
Start: 2015-12-04 — End: 2015-12-04

## 2015-12-04 NOTE — Consult Note (Addendum)
CARDIOLOGY CONSULT NOTE  Patient ID: MCCALL WILL MRN: [id number] DOB/AGE: 74-Mar-1942 74 y.o.  Admit date: 12/03/2015 Referring Physician: Internal Medicine Primary Physician:  Jackie Plum, MD Reason for Consultation: Chest pain, CAD  HPI: Angie Hill  is a 75 y.o. female with a history of diabetes, hypertension, hyperlipidemia, and CAD s/p PTCA and stenting of the mid LAD with a 4.0 x 22 mm integrity BMS on 07/04/2012.  Repeat coronary angiogram was performed on 10/17/2012 due to recurrent chest pain and revealed a patent stent with a new 50% hazy mid LAD stenosis and moderate circumflex and RCA disease.  Medical therapy was recommended at that time.  She was last seen on an outpatient basis on 09/11/2013.  She later moved to [facility] and changed cardiologists to be closer to her home.    She is now back in [place] and presented to the emergency department on 12/03/2015 with chest pain beginning that morning.  Pain was exacerbated by even minimal exertional activities and resolves when she sits down to rest.  She was markedly hypertensive soon after arrival to the emergency department with blood pressure of 160/103. Chest pain is described as sharp pain and points with a finger underneath her left breast. Previously with coronary artery disease, chest pain was described as tightness in the middle of the chest. She has not had recurrence of these symptoms. She did not have sublingual nitroglycerin to take and presented to the emergency room. Chest pain has been ongoing for the past 2-3 days and is continuous.  She has chronic shortness of breath and dyspnea on exertion. Recently has noticed slight worsening. She is diabetic and states that diabetes is relatively well controlled.  Past Medical History:  Diagnosis Date  . Anemia   . Arthritis   . Asthma   . COPD (chronic obstructive pulmonary disease) (HCC)   . Coronary artery disease   . Diabetes mellitus without complication (HCC)     BORDERLINE  . GERD (gastroesophageal reflux disease)   . H/O hiatal hernia   . Hypertension   . Shortness of breath   . Sleep apnea      Past Surgical History:  Procedure Laterality Date  . ABDOMINAL HYSTERECTOMY     partial  . CORONARY ANGIOPLASTY WITH STENT PLACEMENT  07/04/2012   mid LAD  . LEFT AND RIGHT HEART CATHETERIZATION WITH CORONARY ANGIOGRAM N/A 07/04/2012   Procedure: LEFT AND RIGHT HEART CATHETERIZATION WITH CORONARY ANGIOGRAM;  Surgeon: Pamella Pert, MD;  Location: [facility] CATH LAB;  Service: Cardiovascular;  Laterality: N/A;  . LEFT HEART CATHETERIZATION WITH CORONARY ANGIOGRAM N/A 10/17/2012   Procedure: LEFT HEART CATHETERIZATION WITH CORONARY ANGIOGRAM;  Surgeon: Pamella Pert, MD;  Location: [facility] CATH LAB;  Service: Cardiovascular;  Laterality: N/A;  . PERCUTANEOUS CORONARY STENT INTERVENTION (PCI-S)  07/04/2012   Procedure: PERCUTANEOUS CORONARY STENT INTERVENTION (PCI-S);  Surgeon: Pamella Pert, MD;  Location: [facility] CATH LAB;  Service: Cardiovascular;;  . TUBAL LIGATION       Family History  Problem Relation Age of Onset  . Hyperlipidemia Sister   . Heart attack Sister   . Diabetes Daughter      Social History: Social History   Social History  . Marital status: Single    Spouse name: N/A  . Number of children: N/A  . Years of education: N/A   Occupational History  . Not on file.   Social History Main Topics  . Smoking status: Never Smoker  . Smokeless tobacco: Never  Used  . Alcohol use No  . Drug use: No  . Sexual activity: Not Currently   Other Topics Concern  . Not on file   Social History Narrative  . No narrative on file     Prescriptions Prior to Admission  Medication Sig Dispense Refill Last Dose  . albuterol (PROVENTIL HFA;VENTOLIN HFA) 108 (90 BASE) MCG/ACT inhaler Inhale 2 puffs into the lungs 2 (two) times daily as needed for wheezing or shortness of breath.   Taking  . amLODipine-valsartan (EXFORGE) 10-320 MG per tablet Take 1  tablet by mouth every morning.   Taking  . amoxicillin-clavulanate (AUGMENTIN) 500-125 MG tablet Take 1 tablet (500 mg total) by mouth every 8 (eight) hours. 21 tablet 0   . aspirin EC 81 MG tablet Take 81 mg by mouth daily.   Taking  . atorvastatin (LIPITOR) 80 MG tablet Take 80 mg by mouth daily.   Taking  . AZOR 10-20 MG per tablet Take 1 tablet by mouth daily.  3 Taking  . b complex vitamins tablet Take 1 tablet by mouth daily.   Taking  . budesonide-formoterol (SYMBICORT) 160-4.5 MCG/ACT inhaler Inhale 2 puffs into the lungs daily.   Taking  . Calcium Carbonate-Vitamin D (CALCIUM + D PO) Take 1 tablet by mouth daily.   Taking  . clobetasol cream (TEMOVATE) 0.05 % Apply 1 application topically daily. Apply to rash.   Taking  . Cyanocobalamin (VITAMIN B-12 PO) Take 1 tablet by mouth daily.    Taking  . EPINEPHrine (EPI-PEN) 0.3 mg/0.3 mL SOAJ Inject 0.3 mg into the muscle once as needed (for allergic reaction to pollen).   Taking  . gabapentin (NEURONTIN) 300 MG capsule Take 300 mg by mouth at bedtime.   Taking  . hydrALAZINE (APRESOLINE) 50 MG tablet Take 50 mg by mouth 2 (two) times daily.    Taking  . HYDROcodone-acetaminophen (NORCO/VICODIN) 5-325 MG per tablet Take 1 tablet by mouth every 6 (six) hours as needed for pain.   Taking  . hydrOXYzine (VISTARIL) 50 MG capsule Take 50 mg by mouth 4 (four) times daily as needed for itching.   Taking  . mirtazapine (REMERON) 15 MG tablet Take 15 mg by mouth as needed.    Taking  . mirtazapine (REMERON) 15 MG tablet Take 15 mg by mouth at bedtime as needed (for sleep).   Taking  . nebivolol (BYSTOLIC) 10 MG tablet Take 10 mg by mouth daily.   Taking  . omeprazole (PRILOSEC) 20 MG capsule Take 20 mg by mouth daily.   Taking  . pantoprazole (PROTONIX) 40 MG tablet Take 40 mg by mouth daily.    Taking  . Polyvinyl Alcohol-Povidone (REFRESH OP) Place 1 drop into both eyes daily as needed (for dry eyes).   Taking  . promethazine (PHENERGAN) 25 MG  tablet Take 25 mg by mouth every 6 (six) hours as needed for nausea.   Taking  . tiotropium (SPIRIVA) 18 MCG inhalation capsule Place 18 mcg into inhaler and inhale daily.   Taking     ROS: General: no fevers/chills/night sweats Eyes: no blurry vision, diplopia, or amaurosis ENT: no sore throat or hearing loss Resp: no cough, wheezing, or hemoptysis CV: Reports chest pain; no edema or palpitations GI: no abdominal pain, nausea, vomiting, diarrhea, or constipation GU: no dysuria, frequency, or hematuria Skin: no rash Neuro: no headache, numbness, tingling, or weakness of extremities Musculoskeletal: no joint pain or swelling Heme: no bleeding, DVT, or easy bruising Endo: no polydipsia  or polyuria    Physical Exam: Blood pressure 130/64, pulse 65, temperature 98.1 F (36.7 C), temperature source Oral, resp. rate 20, height 5\' 8"  (1.727 m), weight 121.7 kg (268 lb 6.4 oz), SpO2 97 %. Body mass index is 40.81 kg/m.   General appearance: alert, cooperative, appears stated age and morbidly obese Lungs: clear to auscultation bilaterally Chest wall: Easily reproducible tenderness below the left breast area Heart: regular rate and rhythm, S1, S2 normal, no murmur, click, rub or gallop Abdomen: soft, non-tender; bowel sounds normal; no masses,  no organomegaly and pannus present and obese Extremities: extremities normal, atraumatic, no cyanosis or edema Pulses: Carotids no bruit, femoral pulses difficult to feel due to bodily habitus, popliteal could not be felt. Pedal pulses were absent. Capillary refill normal. No evidence of acute arterial insufficiency. Neurologic: Grossly normal  Labs:   Lab Results  Component Value Date   WBC 10.9 (H) 12/04/2015   HGB 12.1 12/04/2015   HCT 39.7 12/04/2015   MCV 84.1 12/04/2015   PLT 319 12/04/2015    Recent Labs Lab 12/03/15 1615 12/04/15 0002  NA 144  --   K 3.2*  --   CL 104  --   CO2 33*  --   BUN 9  --   CREATININE 0.86 0.88   CALCIUM 9.5  --   PROT 7.1  --   BILITOT 0.6  --   ALKPHOS 39  --   ALT 15  --   AST 16  --   GLUCOSE 79  --     Lipid Panel  No results found for: CHOL, TRIG, HDL, CHOLHDL, VLDL, LDLCALC  BNP (last 3 results)  Recent Labs  12/03/15 1615  BNP 22.9    Cardiac Panel (last 3 results)  Recent Labs  12/03/15 2010 12/04/15 0002 12/04/15 0541  TROPONINI <0.03 <0.03 <0.03    Lab Results  Component Value Date   TROPONINI <0.03 12/04/2015     EKG: Normal sinus rhythm, LVH. No evidence of ischemia. EKG this morning shows lead reversal but unchanged from EKG done yesterday..  Echo: pending   Radiology: Dg Chest 2 View  Result Date: 12/03/2015 CLINICAL DATA:  Chest pain and chest congestion with the productive cough. History of COPD. EXAM: CHEST  2 VIEW COMPARISON:  09/14/2014 FINDINGS: Chronic borderline cardiomegaly. Tortuosity of the thoracic aorta. Chronic prominence of the right pulmonary artery. No infiltrates or effusions. No acute osseous abnormality. Coronary artery stents noted. IMPRESSION: No acute abnormalities. Electronically Signed   By: Francene Boyers M.D.   On: 12/03/2015 16:04    Scheduled Meds: . amLODipine  10 mg Oral Daily   And  . irbesartan  150 mg Oral Daily  . aspirin EC  81 mg Oral Daily  . atorvastatin  80 mg Oral Daily  . B-complex with vitamin C  1 tablet Oral Daily  . calcium-vitamin D  1 tablet Oral Q breakfast  . clobetasol cream  1 application Topical Daily  . enoxaparin (LOVENOX) injection  40 mg Subcutaneous Daily  . gabapentin  300 mg Oral QHS  . hydrALAZINE  100 mg Oral BID  . insulin aspart  0-9 Units Subcutaneous TID WC  . mometasone-formoterol  2 puff Inhalation BID  . nebivolol  20 mg Oral Daily  . pantoprazole  40 mg Oral Daily  . tiotropium  18 mcg Inhalation Daily  . vitamin B-12  1,000 mcg Oral Daily   Continuous Infusions:  PRN Meds:.acetaminophen, albuterol, HYDROcodone-acetaminophen, hydrOXYzine, mirtazapine,  ondansetron (ZOFRAN) IV  ASSESSMENT AND PLAN:  1. Muscular skeletal chest pain, easily reproducible, ongoing for the past 3 days with negative cardiac markers and no EKG abnormality. 2. CAD s/p PTCA and stenting of the mid LAD with a 4.0 x 22 mm integrity BMS on 07/04/2012 3. Diabetes Mellitus Type II 4. Essential hypertension with hypertensive heart disease as evidenced by LVH by EKG. On admission, patient's blood pressure is markedly elevated. 5. Hyperlipidemia 6. Morbid obesity 7. Dyspnea on exertion secondary to morbid obesity and obesity hypoventilation,  hypertension probably contributing. 8. OSA 9. GERD 10. Peripheral arterial disease and diabetes mellitus  Recommendation: Patient follows Dr. Sharalyn Inkheeks at [facility]. The presentation is not consistent with unstable angina pectoris. Clearly muscular circumflex pain. Can be discharged home with Ultram, she can follow with her cardiologist. [place] to [facility].  I have described the presentation of angina pectoris to the patient. She does not have sublingual nitroglycerin, hence will need a prescription. She has asymptomatic peripheral arterial disease as evidenced by decreased pulses. I be happy to see her when she is in [place] and be a backup. Patient's medications have duplication.  Would continue Exforge but add hydrochlorothiazide to her present medications and increase Bystolic from 10 mg to 20 mg daily.  Yates DecampJay Heron Pitcock, MD 12/04/2015, 8:23 AM [facility] Pager: [phone] Office: [phone] If no answer Cell [phone]

## 2015-12-04 NOTE — Progress Notes (Signed)
The client refused her hydralazine 100 mg. Stating she used to take 50 mg twice a day and her PCP increased her to 100 mg twice and day. She tried to take this medication after the increase but only took it for about a week because she was not tolerating it. Made her feel as if her heart was racing, sweating, and her head was pounding via the client. Stopped taking around 10 days ago. I will continue to monitor the client closely.   Sheppard Evensina Anaclara Acklin RN

## 2015-12-04 NOTE — Discharge Instructions (Signed)

## 2015-12-04 NOTE — Care Management Obs Status (Signed)
MEDICARE OBSERVATION STATUS NOTIFICATION   Patient Details  Name: Angie HoustonVersie M Polyakov MRN: [id number] Date of Birth: 11-07-1940   Medicare Observation Status Notification Given:  Yes    Gala LewandowskyGraves-Bigelow, Claira Jeter Kaye, RN 12/04/2015, 12:51 PM

## 2015-12-04 NOTE — Discharge Summary (Signed)
Physician Discharge Summary  Angie Hill ZOX:[id number] DOB: [birth year] DOA: 12/03/2015  PCP: Angie Plum, MD  Admit date: 12/03/2015 Discharge date: 12/04/2015  Time spent: 60 minutes  Recommendations for Outpatient Follow-up:  1. Follow-up with cardiologist and [place] in 2 weeks. Patient's bystolic was increased to 20 mg daily and HCTZ added to patient's regimen. Patient's blood pressure need to be reassessed on follow-up. 2. Follow-up with OSEI-BONSU,GEORGE, MD in 2 weeks. On follow-up patient will need a basic metabolic profile done to follow-up on electrolytes and renal function. Patient also may need referral to her gastroenterologist in [facility] for further evaluation of her occasional dysphagia. 3.    Discharge Diagnoses:  Principal Problem:   Chest pain, musculoskeletal Active Problems:   S/P PTCA (percutaneous transluminal coronary angioplasty)   Essential hypertension   Hyperlipidemia   Hereditary and idiopathic peripheral neuropathy   Type 2 diabetes mellitus with vascular disease (HCC)   Discharge Condition: Stable  Diet recommendation: Heart healthy  Filed Weights   12/03/15 1418 12/04/15 0400  Weight: 122.5 kg (270 lb) 121.7 kg (268 lb 6.4 oz)    History of present illness:  Per Angie Hill Angie Hill is a 75 y.o. female with CAD status post stenting in 2014, diabetes mellitus type 2, COPD, hypertension presented to the ER because of chest pain. Patient has been experiencing chest pain since the morning prior to admission, mostly exertional. Patient's chest pain resolved after sitting down. Patient also has been having some epigastric discomfort and denied any vomiting or diarrhea. In the ER EKG chest x-ray and troponin were unremarkable. Given the history of CAD patient has been admitted for further observation and management for chest pain. Patient stated she had been having some upper respiratory tract symptoms since yesterday with some running  nose and frontal headache.  ED Course: Chest x-ray EKG and cardiac markers were negative.  Hospital Course:  #1 atypical chest pain likely musculoskeletal in nature Patient had presented with chest pain which on admission was noted to be exertional and improved at rest. Due to patient's multiple risk factors of prior coronary artery disease status post stenting 2014, diabetes type 2, COPD, hypertension it was felt patient needed to be admitted for chest pain rule out. Cardiac enzymes were cycled which were negative 3. Patient's chest wall pain was clearly reproducible. Cardiology was consulted, and patient seen in consultation by [facility] who also felt patient's chest was musculoskeletal in nature. EKG showed no abnormalities. Patient was placed on scheduled Ultram and is to follow-up with PCP and her cardiologist in [facility]. Patient be discharged in stable and improved condition.  #2 hypertension On admission patient was noted to be hypertensive. Patient was maintained on home regimen of Exforge. Patient's bystolic was increased to 20 mg daily per cardiology recommendations and patient maintained on hydralazine. HCTZ was added to patient's regimen for better blood pressure control. Patient will follow-up with PCP in his cardiologist in the outpatient setting.  #3 diabetes mellitus type 2 Patient was maintained on sliding scale insulin during the hospitalization.  #4 epigastric discomfort Patient had some complaints of epigastric discomfort and some occasional dysphagia. Abdominal ultrasound which was done was negative for acute cholecystitis or any acute abnormalities. Patient's LFTs were within normal limits. Patient did state that from time to time had a sensation of food getting stuck however improved after drinking cups of water area patient tolerated her breakfast this morning without any problems. Is recommended that patient follow-up with her gastroenterologist and  PCP in the outpatient  setting for further evaluation.  The rest of patient's chronic medical issues remain stable throughout the hospitalization.  Procedures:  Abdominal ultrasound 12/04/2015  Chest x-ray 12/03/2015  Consultations:  Cardiology: Angie Hill 12/04/2015  Discharge Exam: Vitals:   12/04/15 0947 12/04/15 1235  BP: 135/83 (!) 126/58  Pulse: 74 82  Resp:    Temp: 98.3 F (36.8 C) 98 F (36.7 C)    General: NAD Cardiovascular: RRR Respiratory: CTAB  Discharge Instructions   Discharge Instructions    Diet - low sodium heart healthy    Complete by:  As directed    Discharge instructions    Complete by:  As directed    Follow up with OSEI-BONSU,GEORGE, MD in 2 weeks. Follow up with your cardiologist, Angie Hill in 2 weeks.   Increase activity slowly    Complete by:  As directed      Current Discharge Medication List    START taking these medications   Details  hydrochlorothiazide (HYDRODIURIL) 25 MG tablet Take 1 tablet (25 mg total) by mouth daily. Qty: 30 tablet, Refills: 4    nitroGLYCERIN (NITROSTAT) 0.4 MG SL tablet Place 1 tablet (0.4 mg total) under the tongue every 5 (five) minutes as needed for chest pain. Qty: 15 tablet, Refills: 0    traMADol (ULTRAM) 50 MG tablet Take 1 tablet (50 mg total) by mouth every 6 (six) hours as needed. Take 1 tablet 3 times daily x 5 days, then every 6 hours as needed. Qty: 20 tablet, Refills: 0      CONTINUE these medications which have CHANGED   Details  nebivolol 20 MG TABS Take 1 tablet (20 mg total) by mouth daily. Qty: 30 tablet, Refills: 3      CONTINUE these medications which have NOT CHANGED   Details  albuterol (PROVENTIL HFA;VENTOLIN HFA) 108 (90 BASE) MCG/ACT inhaler Inhale 2 puffs into the lungs 2 (two) times daily as needed for wheezing or shortness of breath.    amLODipine-valsartan (EXFORGE) 10-320 MG per tablet Take 1 tablet by mouth every morning.    aspirin EC 81 MG tablet Take 81 mg by mouth daily.     atorvastatin (LIPITOR) 80 MG tablet Take 80 mg by mouth daily.    b complex vitamins tablet Take 1 tablet by mouth daily.    budesonide-formoterol (SYMBICORT) 160-4.5 MCG/ACT inhaler Inhale 2 puffs into the lungs daily.    Calcium Carbonate-Vitamin D (CALCIUM + D PO) Take 1 tablet by mouth daily.    clobetasol cream (TEMOVATE) 0.05 % Apply 1 application topically daily. Apply to rash.    Cyanocobalamin (VITAMIN B-12 PO) Take 1 tablet by mouth daily.     EPINEPHrine (EPI-PEN) 0.3 mg/0.3 mL SOAJ Inject 0.3 mg into the muscle once as needed (for allergic reaction to pollen).    gabapentin (NEURONTIN) 300 MG capsule Take 300 mg by mouth at bedtime.    hydrALAZINE (APRESOLINE) 50 MG tablet Take 50 mg by mouth 2 (two) times daily.     HYDROcodone-acetaminophen (NORCO/VICODIN) 5-325 MG per tablet Take 1 tablet by mouth every 6 (six) hours as needed for pain.    hydrOXYzine (VISTARIL) 50 MG capsule Take 50 mg by mouth 4 (four) times daily as needed for itching.    mirtazapine (REMERON) 15 MG tablet Take 15 mg by mouth at bedtime as needed (for sleep).    pantoprazole (PROTONIX) 40 MG tablet Take 40 mg by mouth daily.     Polyvinyl Alcohol-Povidone (REFRESH OP)  Place 1 drop into both eyes daily as needed (for dry eyes).    promethazine (PHENERGAN) 25 MG tablet Take 25 mg by mouth every 6 (six) hours as needed for nausea.    tiotropium (SPIRIVA) 18 MCG inhalation capsule Place 18 mcg into inhaler and inhale daily.      STOP taking these medications     amoxicillin-clavulanate (AUGMENTIN) 500-125 MG tablet      AZOR 10-20 MG per tablet      omeprazole (PRILOSEC) 20 MG capsule        Allergies  Allergen Reactions  . Ciprofloxacin Itching and Rash  . Erythromycin Itching and Rash   Follow-up Information    OSEI-BONSU,GEORGE, MD. Schedule an appointment as soon as possible for a visit in 2 week(s).   Specialty:  Internal Medicine Contact information: [address] [place] [address] [phone]        Cardiologist,[place]. Schedule an appointment as soon as possible for a visit in 2 day(s).   Why:  Follow up with your cardiologist in 2-3 weeks.           The results of significant diagnostics from this hospitalization (including imaging, microbiology, ancillary and laboratory) are listed below for reference.    Significant Diagnostic Studies: Dg Chest 2 View  Result Date: 12/03/2015 CLINICAL DATA:  Chest pain and chest congestion with the productive cough. History of COPD. EXAM: CHEST  2 VIEW COMPARISON:  09/14/2014 FINDINGS: Chronic borderline cardiomegaly. Tortuosity of the thoracic aorta. Chronic prominence of the right pulmonary artery. No infiltrates or effusions. No acute osseous abnormality. Coronary artery stents noted. IMPRESSION: No acute abnormalities. Electronically Signed   By: Francene Boyers M.D.   On: 12/03/2015 16:04   US Abdomen Complete  Result Date: 12/04/2015 CLINICAL DATA:  Epigastric pain EXAM: ABDOMEN ULTRASOUND COMPLETE COMPARISON:  CT abdomen pelvis 03/16/2013 FINDINGS: Gallbladder: Gallbladder well distended. Echogenic focus on the nondependent wall of the gallbladder fundus. This is calcified on prior CT and may represent a calcified polyp or adherent stone. No shadowing. No gallbladder wall thickening. Negative sonographic Murphy sign Common bile duct: Diameter: 5.8 mm Liver: No focal lesion identified. Within normal limits in parenchymal echogenicity. IVC: No abnormality visualized. Pancreas: Visualized portion unremarkable. Spleen: Size and appearance within normal limits. Right Kidney: Length: 12.6 cm. Echogenicity within normal limits. No mass or hydronephrosis visualized. Left Kidney: Length: 12.3 cm. Echogenicity within normal limits. No mass or hydronephrosis visualized. Abdominal aorta: No aneurysm visualized. Other findings: None. IMPRESSION: Small 3 mm echogenic focus on the anterior wall of the  gallbladder fundus which is calcified on CT and may represent a calcified polyp or adherent stone. No gallbladder wall thickening. Otherwise negative. Electronically Signed   By: Marlan Palau M.D.   On: 12/04/2015 09:20    Microbiology: Recent Results (from the past 240 hour(s))  MRSA PCR Screening     Status: None   Collection Time: 12/03/15 10:14 PM  Result Value Ref Range Status   MRSA by PCR NEGATIVE NEGATIVE Final    Comment:        The GeneXpert MRSA Assay (FDA approved for NASAL specimens only), is one component of a comprehensive MRSA colonization surveillance program. It is not intended to diagnose MRSA infection nor to guide or monitor treatment for MRSA infections.      Labs: Basic Metabolic Panel:  Recent Labs Lab 12/03/15 1615 12/03/15 1811 12/04/15 0002  NA 144  --   --   K 3.2*  --   --  CL 104  --   --   CO2 33*  --   --   GLUCOSE 79  --   --   BUN 9  --   --   CREATININE 0.86  --  0.88  CALCIUM 9.5  --   --   MG  --  1.8  --    Liver Function Tests:  Recent Labs Lab 12/03/15 1615  AST 16  ALT 15  ALKPHOS 39  BILITOT 0.6  PROT 7.1  ALBUMIN 3.8   No results for input(s): LIPASE, AMYLASE in the last 168 hours. No results for input(s): AMMONIA in the last 168 hours. CBC:  Recent Labs Lab 12/03/15 1615 12/04/15 0002  WBC 9.4 10.9*  NEUTROABS 4.8  --   HGB 12.4 12.1  HCT 39.1 39.7  MCV 82.1 84.1  PLT 329 319   Cardiac Enzymes:  Recent Labs Lab 12/03/15 1615 12/03/15 2010 12/04/15 0002 12/04/15 0541  TROPONINI <0.03 <0.03 <0.03 <0.03   BNP: BNP (last 3 results)  Recent Labs  12/03/15 1615  BNP 22.9    ProBNP (last 3 results) No results for input(s): PROBNP in the last 8760 hours.  CBG:  Recent Labs Lab 12/04/15 0734 12/04/15 1134  GLUCAP 95 182*       Signed:  Marcella Dunnaway MD.  [facility] 12/04/2015, 12:42 PM

## 2015-12-04 NOTE — Progress Notes (Signed)
The client starting having chest pain after it had initially resolved with rest while waiting for the admitting MD after transfer from [facility]. This pain return [facility]. I got a EKG and put the client back to bed I notified Toniann FailKakrakandy and he place a one time 0.5 iv morphine. I will continue to monitor the client closely.  Sheppard Evensina Miracle Mongillo RN

## 2015-12-12 DIAGNOSIS — I2510 Atherosclerotic heart disease of native coronary artery without angina pectoris: Secondary | ICD-10-CM | POA: Insufficient documentation

## 2015-12-12 DIAGNOSIS — I25118 Atherosclerotic heart disease of native coronary artery with other forms of angina pectoris: Secondary | ICD-10-CM

## 2015-12-12 DIAGNOSIS — R0789 Other chest pain: Secondary | ICD-10-CM | POA: Diagnosis not present

## 2015-12-12 DIAGNOSIS — I10 Essential (primary) hypertension: Secondary | ICD-10-CM | POA: Diagnosis not present

## 2015-12-12 DIAGNOSIS — R0609 Other forms of dyspnea: Secondary | ICD-10-CM | POA: Diagnosis not present

## 2015-12-12 HISTORY — DX: Atherosclerotic heart disease of native coronary artery with other forms of angina pectoris: I25.118

## 2015-12-16 DIAGNOSIS — J45909 Unspecified asthma, uncomplicated: Secondary | ICD-10-CM | POA: Diagnosis not present

## 2015-12-17 DIAGNOSIS — R05 Cough: Secondary | ICD-10-CM | POA: Diagnosis not present

## 2015-12-17 DIAGNOSIS — I10 Essential (primary) hypertension: Secondary | ICD-10-CM | POA: Diagnosis not present

## 2015-12-17 DIAGNOSIS — R0789 Other chest pain: Secondary | ICD-10-CM | POA: Diagnosis not present

## 2015-12-17 DIAGNOSIS — Z7984 Long term (current) use of oral hypoglycemic drugs: Secondary | ICD-10-CM | POA: Diagnosis not present

## 2015-12-17 DIAGNOSIS — E119 Type 2 diabetes mellitus without complications: Secondary | ICD-10-CM | POA: Diagnosis not present

## 2015-12-17 DIAGNOSIS — J069 Acute upper respiratory infection, unspecified: Secondary | ICD-10-CM | POA: Diagnosis not present

## 2015-12-17 DIAGNOSIS — R079 Chest pain, unspecified: Secondary | ICD-10-CM | POA: Diagnosis not present

## 2015-12-17 DIAGNOSIS — R0981 Nasal congestion: Secondary | ICD-10-CM | POA: Diagnosis not present

## 2015-12-17 DIAGNOSIS — Z7982 Long term (current) use of aspirin: Secondary | ICD-10-CM | POA: Diagnosis not present

## 2015-12-17 DIAGNOSIS — I2510 Atherosclerotic heart disease of native coronary artery without angina pectoris: Secondary | ICD-10-CM | POA: Diagnosis not present

## 2015-12-17 DIAGNOSIS — Z9289 Personal history of other medical treatment: Secondary | ICD-10-CM | POA: Diagnosis not present

## 2015-12-17 DIAGNOSIS — J209 Acute bronchitis, unspecified: Secondary | ICD-10-CM | POA: Diagnosis not present

## 2015-12-17 DIAGNOSIS — R062 Wheezing: Secondary | ICD-10-CM | POA: Diagnosis not present

## 2015-12-25 ENCOUNTER — Encounter: Payer: Self-pay | Admitting: Family

## 2015-12-25 DIAGNOSIS — I83813 Varicose veins of bilateral lower extremities with pain: Secondary | ICD-10-CM | POA: Diagnosis not present

## 2015-12-25 DIAGNOSIS — R600 Localized edema: Secondary | ICD-10-CM | POA: Diagnosis not present

## 2015-12-30 ENCOUNTER — Encounter: Payer: Self-pay | Admitting: Family

## 2015-12-30 ENCOUNTER — Ambulatory Visit (INDEPENDENT_AMBULATORY_CARE_PROVIDER_SITE_OTHER): Payer: Medicare Other | Admitting: Family

## 2015-12-30 ENCOUNTER — Ambulatory Visit (HOSPITAL_COMMUNITY)
Admission: RE | Admit: 2015-12-30 | Discharge: 2015-12-30 | Disposition: A | Discharge status: Home / Self Care | Payer: Medicare Other | Source: Ambulatory Visit | Attending: Family | Admitting: Family

## 2015-12-30 VITALS — BP 148/90 | HR 83 | Temp 97.5°F | Resp 18 | Ht 67.0 in | Wt 270.0 lb

## 2015-12-30 DIAGNOSIS — I77819 Aortic ectasia, unspecified site: Secondary | ICD-10-CM | POA: Diagnosis not present

## 2015-12-30 NOTE — Progress Notes (Signed)
[facility] [place]   CC: Follow up Abdominal Aortic Aneurysm  History of Present Illness  Angie Hill is a 75 y.o. (1940/11/15) femalepatient of Dr. Darrick Penna who returns for follow up of abdominal aortic ectasia. She has a history of chronic problems with upper abdominal pain. She has also previously had multiple dilations of her esophagus in the past. She apparently had an ultrasound in 2016 which suggested 5.7 cm abdominal aortic aneurysm. Unfortunately the images and report were not available for her office visit with Dr. Darrick Penna on 10/31/14. However there was one line in her office notes which suggested this. She denies family history of abdominal aortic aneurysm. Risk factors include coronary artery disease and hypertension. She denies history of smoking. Other medical problems include sleep apnea, reflux, arthritis, COPD. All of these are currently stable.   At her 10/31/14 visit with Dr. Darrick Penna, duplex at that time demonstrated 2.8 cm abdominal aorta which is in the normal range. Since she has discrepant ultrasounds Dr. Darrick Penna notes it is worthwhile to repeat her aortic ultrasound in 1 year to make sure there is no change. If her aortic diameter is unchanged in one year she can follow-up on as-needed basis.  At that visit Dr. Darrick Penna encouraged the patient to follow-up with her GI doctor that has done her previous esophageal dilations if she is having difficulty swallowing and upper abdominal pain.  She states that she was told years ago that she has a herniated disc in her low back. When she stands for over 10-15 minutes she reports numbness at her low back to mid thigh. She denies any history of stroke or TIA.  Pt Diabetic: Yes, unknown A1C, her reported glucoses are in control: 109-112 Pt smoker: non-smoker  Past Medical History:  Diagnosis Date  . Anemia   . Arthritis   . Asthma   . COPD (chronic obstructive pulmonary disease) (HCC)   . Coronary artery disease    . Diabetes mellitus without complication (HCC)    BORDERLINE  . GERD (gastroesophageal reflux disease)   . H/O hiatal hernia   . Hypertension   . Shortness of breath   . Sleep apnea    Past Surgical History:  Procedure Laterality Date  . ABDOMINAL HYSTERECTOMY     partial  . CORONARY ANGIOPLASTY WITH STENT PLACEMENT  07/04/2012   mid LAD  . LEFT AND RIGHT HEART CATHETERIZATION WITH CORONARY ANGIOGRAM N/A 07/04/2012   Procedure: LEFT AND RIGHT HEART CATHETERIZATION WITH CORONARY ANGIOGRAM;  Surgeon: Pamella Pert, MD;  Location: [facility] CATH LAB;  Service: Cardiovascular;  Laterality: N/A;  . LEFT HEART CATHETERIZATION WITH CORONARY ANGIOGRAM N/A 10/17/2012   Procedure: LEFT HEART CATHETERIZATION WITH CORONARY ANGIOGRAM;  Surgeon: Pamella Pert, MD;  Location: [facility] CATH LAB;  Service: Cardiovascular;  Laterality: N/A;  . PERCUTANEOUS CORONARY STENT INTERVENTION (PCI-S)  07/04/2012   Procedure: PERCUTANEOUS CORONARY STENT INTERVENTION (PCI-S);  Surgeon: Pamella Pert, MD;  Location: [facility] CATH LAB;  Service: Cardiovascular;;  . TUBAL LIGATION     Social History Social History   Social History  . Marital status: Single    Spouse name: N/A  . Number of children: N/A  . Years of education: N/A   Occupational History  . Not on file.   Social History Main Topics  . Smoking status: Never Smoker  . Smokeless tobacco: Never Used  . Alcohol use No  . Drug use: No  . Sexual activity: Not Currently   Other Topics Concern  .  Not on file   Social History Narrative  . No narrative on file   Family History Family History  Problem Relation Age of Onset  . Hyperlipidemia Sister   . Heart attack Sister   . Diabetes Daughter     Current Outpatient Prescriptions on File Prior to Visit  Medication Sig Dispense Refill  . albuterol (PROVENTIL HFA;VENTOLIN HFA) 108 (90 BASE) MCG/ACT inhaler Inhale 2 puffs into the lungs 2 (two) times daily as needed for wheezing or shortness of breath.     Marland Kitchen amLODipine-valsartan (EXFORGE) 10-320 MG per tablet Take 1 tablet by mouth every morning.    Marland Kitchen aspirin EC 81 MG tablet Take 81 mg by mouth daily.    Marland Kitchen atorvastatin (LIPITOR) 80 MG tablet Take 80 mg by mouth daily.    Marland Kitchen b complex vitamins tablet Take 1 tablet by mouth daily.    . budesonide-formoterol (SYMBICORT) 160-4.5 MCG/ACT inhaler Inhale 2 puffs into the lungs daily.    . Calcium Carbonate-Vitamin D (CALCIUM + D PO) Take 1 tablet by mouth daily.    . clobetasol cream (TEMOVATE) 0.05 % Apply 1 application topically daily. Apply to rash.    . Cyanocobalamin (VITAMIN B-12 PO) Take 1 tablet by mouth daily.     Marland Kitchen EPINEPHrine (EPI-PEN) 0.3 mg/0.3 mL SOAJ Inject 0.3 mg into the muscle once as needed (for allergic reaction to pollen).    . gabapentin (NEURONTIN) 300 MG capsule Take 300 mg by mouth at bedtime.    . hydrALAZINE (APRESOLINE) 50 MG tablet Take 50 mg by mouth 2 (two) times daily.     . hydrochlorothiazide (HYDRODIURIL) 25 MG tablet Take 1 tablet (25 mg total) by mouth daily. 30 tablet 4  . HYDROcodone-acetaminophen (NORCO/VICODIN) 5-325 MG per tablet Take 1 tablet by mouth every 6 (six) hours as needed for pain.    . hydrOXYzine (VISTARIL) 50 MG capsule Take 50 mg by mouth 4 (four) times daily as needed for itching.    . mirtazapine (REMERON) 15 MG tablet Take 15 mg by mouth at bedtime as needed (for sleep).    . nebivolol 20 MG TABS Take 1 tablet (20 mg total) by mouth daily. 30 tablet 3  . nitroGLYCERIN (NITROSTAT) 0.4 MG SL tablet Place 1 tablet (0.4 mg total) under the tongue every 5 (five) minutes as needed for chest pain. 15 tablet 0  . pantoprazole (PROTONIX) 40 MG tablet Take 40 mg by mouth daily.     . Polyvinyl Alcohol-Povidone (REFRESH OP) Place 1 drop into both eyes daily as needed (for dry eyes).    . promethazine (PHENERGAN) 25 MG tablet Take 25 mg by mouth every 6 (six) hours as needed for nausea.    Marland Kitchen tiotropium (SPIRIVA) 18 MCG inhalation capsule Place 18 mcg into  inhaler and inhale daily.    . traMADol (ULTRAM) 50 MG tablet Take 1 tablet (50 mg total) by mouth every 6 (six) hours as needed. Take 1 tablet 3 times daily x 5 days, then every 6 hours as needed. 20 tablet 0   No current facility-administered medications on file prior to visit.    Allergies  Allergen Reactions  . Ciprofloxacin Itching and Rash  . Erythromycin Itching and Rash    ROS: See HPI for pertinent positives and negatives.  Physical Examination  Vitals:   12/30/15 0904 12/30/15 0907  BP: (!) 145/88 (!) 148/90  Pulse: 83 83  Resp: 18   Temp: 97.5 F (36.4 C)   SpO2: 98%  Weight: 270 lb (122.5 kg)   Height: 5\' 7"[id number]$  (1.702 m)    Body mass index is 42.29 kg/m.  General: A&O x 3, WD, morbidly obese female.  Pulmonary: Sym exp, respirations are non labored, distant breath sounds, CTAB, no rales, rhonchi, or wheezing.  Cardiac: RRR, Nl S1, S2, no detected murmur.   Carotid Bruits Right Left   Negative Negative   Aorta is not palpable Radial pulses are 2+ palpable and =                          VASCULAR EXAM:                                                                                                         LE Pulses Right Left       FEMORAL  not palpable (obese)  not palpable (obese)        POPLITEAL  not palpable   not palpable       POSTERIOR TIBIAL  not palpable   not palpable        DORSALIS PEDIS      ANTERIOR TIBIAL 2+ palpable  2+ palpable     Gastrointestinal: soft, NTND, -G/R, - HSM, - masses palpated, - CVAT B.  Musculoskeletal: M/S 5/5 throughout, Extremities without ischemic changes.  Neurologic: CN 2-12 intact, Pain and light touch intact in extremities are intact, Motor exam as listed above.  Non-Invasive Vascular Imaging  AAA Duplex (12/30/2015)  Previous size: 2.8 cm (Date: 10/31/14)  Current size:  2.43 cm (Date: 12/30/15)  Medical Decision Making  The patient is a 75 y.o. female who presents with asymptomatic normal  diameter abdominal aorta for the second as evidenced by the second serial duplex. At her 10/31/14 visit with Dr. Darrick PennaFields, duplex at that time demonstrated 2.8 cm abdominal aorta which is in the normal range. Since she has discrepant ultrasounds Dr. Darrick PennaFields notes it is worthwhile to repeat her aortic ultrasound in 1 year to make sure there is no change. If her aortic diameter is unchanged in one year she can follow-up on as-needed basis.   Based on this patient's exam and diagnostic studies, the patient will follow up prn.   Charisse MarchSuzanne Alura Olveda, RN, MSN, FNP-C [facility] [place] Office: [phone]  Clinic Physician: Early  12/30/2015, 9:16 AM

## 2016-01-09 DIAGNOSIS — Z5181 Encounter for therapeutic drug level monitoring: Secondary | ICD-10-CM | POA: Diagnosis not present

## 2016-01-09 DIAGNOSIS — J302 Other seasonal allergic rhinitis: Secondary | ICD-10-CM | POA: Diagnosis not present

## 2016-01-09 DIAGNOSIS — E1151 Type 2 diabetes mellitus with diabetic peripheral angiopathy without gangrene: Secondary | ICD-10-CM | POA: Diagnosis not present

## 2016-01-09 DIAGNOSIS — M25512 Pain in left shoulder: Secondary | ICD-10-CM | POA: Diagnosis not present

## 2016-01-09 DIAGNOSIS — R609 Edema, unspecified: Secondary | ICD-10-CM | POA: Diagnosis not present

## 2016-01-09 DIAGNOSIS — I10 Essential (primary) hypertension: Secondary | ICD-10-CM | POA: Diagnosis not present

## 2016-01-16 DIAGNOSIS — J45909 Unspecified asthma, uncomplicated: Secondary | ICD-10-CM | POA: Diagnosis not present

## 2016-02-15 DIAGNOSIS — J45909 Unspecified asthma, uncomplicated: Secondary | ICD-10-CM | POA: Diagnosis not present

## 2016-03-04 DIAGNOSIS — Z7689 Persons encountering health services in other specified circumstances: Secondary | ICD-10-CM | POA: Diagnosis not present

## 2016-03-04 DIAGNOSIS — J029 Acute pharyngitis, unspecified: Secondary | ICD-10-CM | POA: Diagnosis not present

## 2016-03-04 DIAGNOSIS — E1151 Type 2 diabetes mellitus with diabetic peripheral angiopathy without gangrene: Secondary | ICD-10-CM | POA: Diagnosis not present

## 2016-03-04 DIAGNOSIS — M25512 Pain in left shoulder: Secondary | ICD-10-CM | POA: Diagnosis not present

## 2016-03-04 DIAGNOSIS — I10 Essential (primary) hypertension: Secondary | ICD-10-CM | POA: Diagnosis not present

## 2016-03-04 DIAGNOSIS — Z5181 Encounter for therapeutic drug level monitoring: Secondary | ICD-10-CM | POA: Diagnosis not present

## 2016-03-04 DIAGNOSIS — R609 Edema, unspecified: Secondary | ICD-10-CM | POA: Diagnosis not present

## 2016-03-04 DIAGNOSIS — J302 Other seasonal allergic rhinitis: Secondary | ICD-10-CM | POA: Diagnosis not present

## 2016-03-17 DIAGNOSIS — J45909 Unspecified asthma, uncomplicated: Secondary | ICD-10-CM | POA: Diagnosis not present

## 2016-03-26 DIAGNOSIS — E1151 Type 2 diabetes mellitus with diabetic peripheral angiopathy without gangrene: Secondary | ICD-10-CM | POA: Diagnosis not present

## 2016-03-26 DIAGNOSIS — J302 Other seasonal allergic rhinitis: Secondary | ICD-10-CM | POA: Diagnosis not present

## 2016-03-26 DIAGNOSIS — I10 Essential (primary) hypertension: Secondary | ICD-10-CM | POA: Diagnosis not present

## 2016-03-26 DIAGNOSIS — M25512 Pain in left shoulder: Secondary | ICD-10-CM | POA: Diagnosis not present

## 2016-03-26 DIAGNOSIS — R609 Edema, unspecified: Secondary | ICD-10-CM | POA: Diagnosis not present

## 2016-03-26 DIAGNOSIS — J181 Lobar pneumonia, unspecified organism: Secondary | ICD-10-CM | POA: Diagnosis not present

## 2016-04-12 DIAGNOSIS — M25512 Pain in left shoulder: Secondary | ICD-10-CM | POA: Diagnosis not present

## 2016-04-12 DIAGNOSIS — R609 Edema, unspecified: Secondary | ICD-10-CM | POA: Diagnosis not present

## 2016-04-12 DIAGNOSIS — M25511 Pain in right shoulder: Secondary | ICD-10-CM | POA: Diagnosis not present

## 2016-04-12 DIAGNOSIS — E1151 Type 2 diabetes mellitus with diabetic peripheral angiopathy without gangrene: Secondary | ICD-10-CM | POA: Diagnosis not present

## 2016-04-12 DIAGNOSIS — E785 Hyperlipidemia, unspecified: Secondary | ICD-10-CM | POA: Diagnosis not present

## 2016-04-12 DIAGNOSIS — J302 Other seasonal allergic rhinitis: Secondary | ICD-10-CM | POA: Diagnosis not present

## 2016-04-12 DIAGNOSIS — R140 Abdominal distension (gaseous): Secondary | ICD-10-CM | POA: Diagnosis not present

## 2016-04-13 DIAGNOSIS — L84 Corns and callosities: Secondary | ICD-10-CM | POA: Diagnosis not present

## 2016-04-13 DIAGNOSIS — B351 Tinea unguium: Secondary | ICD-10-CM | POA: Diagnosis not present

## 2016-04-13 DIAGNOSIS — M79672 Pain in left foot: Secondary | ICD-10-CM | POA: Diagnosis not present

## 2016-04-13 DIAGNOSIS — M79671 Pain in right foot: Secondary | ICD-10-CM | POA: Diagnosis not present

## 2016-04-13 DIAGNOSIS — E119 Type 2 diabetes mellitus without complications: Secondary | ICD-10-CM | POA: Diagnosis not present

## 2016-04-15 DIAGNOSIS — Z86010 Personal history of colonic polyps: Secondary | ICD-10-CM | POA: Diagnosis not present

## 2016-04-15 DIAGNOSIS — R110 Nausea: Secondary | ICD-10-CM | POA: Diagnosis not present

## 2016-04-15 DIAGNOSIS — R1010 Upper abdominal pain, unspecified: Secondary | ICD-10-CM | POA: Diagnosis not present

## 2016-04-15 DIAGNOSIS — K220 Achalasia of cardia: Secondary | ICD-10-CM | POA: Diagnosis not present

## 2016-04-15 DIAGNOSIS — K2270 Barrett's esophagus without dysplasia: Secondary | ICD-10-CM | POA: Diagnosis not present

## 2016-04-17 DIAGNOSIS — J45909 Unspecified asthma, uncomplicated: Secondary | ICD-10-CM | POA: Diagnosis not present

## 2016-04-19 DIAGNOSIS — Z01118 Encounter for examination of ears and hearing with other abnormal findings: Secondary | ICD-10-CM | POA: Diagnosis not present

## 2016-04-19 DIAGNOSIS — Z0000 Encounter for general adult medical examination without abnormal findings: Secondary | ICD-10-CM | POA: Diagnosis not present

## 2016-04-19 DIAGNOSIS — Z0100 Encounter for examination of eyes and vision without abnormal findings: Secondary | ICD-10-CM | POA: Diagnosis not present

## 2016-04-19 DIAGNOSIS — I10 Essential (primary) hypertension: Secondary | ICD-10-CM | POA: Diagnosis not present

## 2016-04-19 DIAGNOSIS — R609 Edema, unspecified: Secondary | ICD-10-CM | POA: Diagnosis not present

## 2016-04-19 DIAGNOSIS — R1013 Epigastric pain: Secondary | ICD-10-CM | POA: Diagnosis not present

## 2016-04-19 DIAGNOSIS — Z1389 Encounter for screening for other disorder: Secondary | ICD-10-CM | POA: Diagnosis not present

## 2016-04-19 DIAGNOSIS — Z131 Encounter for screening for diabetes mellitus: Secondary | ICD-10-CM | POA: Diagnosis not present

## 2016-04-19 DIAGNOSIS — J302 Other seasonal allergic rhinitis: Secondary | ICD-10-CM | POA: Diagnosis not present

## 2016-04-19 DIAGNOSIS — E1151 Type 2 diabetes mellitus with diabetic peripheral angiopathy without gangrene: Secondary | ICD-10-CM | POA: Diagnosis not present

## 2016-04-20 DIAGNOSIS — E785 Hyperlipidemia, unspecified: Secondary | ICD-10-CM | POA: Diagnosis not present

## 2016-04-20 DIAGNOSIS — R079 Chest pain, unspecified: Secondary | ICD-10-CM | POA: Diagnosis not present

## 2016-04-20 DIAGNOSIS — I10 Essential (primary) hypertension: Secondary | ICD-10-CM | POA: Diagnosis not present

## 2016-04-20 DIAGNOSIS — R0609 Other forms of dyspnea: Secondary | ICD-10-CM | POA: Diagnosis not present

## 2016-04-20 DIAGNOSIS — I2510 Atherosclerotic heart disease of native coronary artery without angina pectoris: Secondary | ICD-10-CM | POA: Diagnosis not present

## 2016-04-29 DIAGNOSIS — E1151 Type 2 diabetes mellitus with diabetic peripheral angiopathy without gangrene: Secondary | ICD-10-CM | POA: Diagnosis not present

## 2016-04-29 DIAGNOSIS — M25512 Pain in left shoulder: Secondary | ICD-10-CM | POA: Diagnosis not present

## 2016-04-29 DIAGNOSIS — I10 Essential (primary) hypertension: Secondary | ICD-10-CM | POA: Diagnosis not present

## 2016-04-29 DIAGNOSIS — J302 Other seasonal allergic rhinitis: Secondary | ICD-10-CM | POA: Diagnosis not present

## 2016-04-29 DIAGNOSIS — M25511 Pain in right shoulder: Secondary | ICD-10-CM | POA: Diagnosis not present

## 2016-04-29 DIAGNOSIS — R609 Edema, unspecified: Secondary | ICD-10-CM | POA: Diagnosis not present

## 2016-05-14 ENCOUNTER — Encounter (HOSPITAL_BASED_OUTPATIENT_CLINIC_OR_DEPARTMENT_OTHER): Payer: Self-pay | Admitting: Emergency Medicine

## 2016-05-14 ENCOUNTER — Emergency Department (HOSPITAL_BASED_OUTPATIENT_CLINIC_OR_DEPARTMENT_OTHER)
Admission: EM | Admit: 2016-05-14 | Discharge: 2016-05-14 | Disposition: A | Discharge status: Home / Self Care | Payer: Medicare Other | Attending: Emergency Medicine | Admitting: Emergency Medicine

## 2016-05-14 DIAGNOSIS — J449 Chronic obstructive pulmonary disease, unspecified: Secondary | ICD-10-CM | POA: Diagnosis not present

## 2016-05-14 DIAGNOSIS — J45909 Unspecified asthma, uncomplicated: Secondary | ICD-10-CM | POA: Insufficient documentation

## 2016-05-14 DIAGNOSIS — I10 Essential (primary) hypertension: Secondary | ICD-10-CM | POA: Diagnosis not present

## 2016-05-14 DIAGNOSIS — I2510 Atherosclerotic heart disease of native coronary artery without angina pectoris: Secondary | ICD-10-CM | POA: Diagnosis not present

## 2016-05-14 DIAGNOSIS — E119 Type 2 diabetes mellitus without complications: Secondary | ICD-10-CM | POA: Insufficient documentation

## 2016-05-14 DIAGNOSIS — M25512 Pain in left shoulder: Secondary | ICD-10-CM | POA: Diagnosis not present

## 2016-05-14 DIAGNOSIS — M25519 Pain in unspecified shoulder: Secondary | ICD-10-CM

## 2016-05-14 DIAGNOSIS — M10071 Idiopathic gout, right ankle and foot: Secondary | ICD-10-CM | POA: Diagnosis not present

## 2016-05-14 DIAGNOSIS — M25511 Pain in right shoulder: Secondary | ICD-10-CM | POA: Diagnosis not present

## 2016-05-14 DIAGNOSIS — M79671 Pain in right foot: Secondary | ICD-10-CM | POA: Diagnosis present

## 2016-05-14 DIAGNOSIS — Z79899 Other long term (current) drug therapy: Secondary | ICD-10-CM | POA: Diagnosis not present

## 2016-05-14 DIAGNOSIS — Z7982 Long term (current) use of aspirin: Secondary | ICD-10-CM | POA: Insufficient documentation

## 2016-05-14 MED ORDER — PREDNISONE 20 MG PO TABS: ORAL_TABLET | ORAL | 0 refills | Status: DC

## 2016-05-14 MED ORDER — HYDROCODONE-ACETAMINOPHEN 5-325 MG PO TABS
2.0000 | ORAL_TABLET | Freq: Once | ORAL | Status: AC
  Administered 2016-05-14: 2 via ORAL
  Filled 2016-05-14: qty 2

## 2016-05-14 MED ORDER — PREDNISONE 50 MG PO TABS
60.0000 mg | ORAL_TABLET | Freq: Once | ORAL | Status: AC
  Administered 2016-05-14: 60 mg via ORAL
  Filled 2016-05-14: qty 1

## 2016-05-14 MED ORDER — HYDROCODONE-ACETAMINOPHEN 5-325 MG PO TABS: 1.0000 | ORAL_TABLET | Freq: Four times a day (QID) | ORAL | 0 refills | Status: DC | PRN

## 2016-05-14 MED FILL — HYDROCODON-APAP 5-325: 5-325 | 3 days supply | Qty: 15 | Fill #0

## 2016-05-14 MED FILL — predniSONE 20 MG TABS: 20 | 5 days supply | Qty: 9 | Fill #0

## 2016-05-14 NOTE — ED Triage Notes (Signed)
Patient states that she woke up yesterday morning with some noted left arm pain, and then it started to hurt throughout her body.

## 2016-05-14 NOTE — Discharge Instructions (Signed)
It was our pleasure to provide your ER care today - we hope that you feel better.  Take prednisone as prescribed.  You may take hydrocodone as need for pain. No driving for the next 6 hours or when taking hydrocodone. Also, do not take tylenol or acetaminophen containing medication when taking hydrocodone.  Follow up with primary care doctor in the next few days for recheck if symptoms fail to improve/resolve.  Return to ER if worse, new symptoms, fevers, increased swelling/spreading redness, other concern.

## 2016-05-14 NOTE — ED Provider Notes (Signed)
[facility] Provider Note   CSN: [id number] Arrival date & time: 05/14/16  1230     History   Chief Complaint Chief Complaint  Patient presents with  . Generalized Body Aches    HPI Rogene HoustonVersie M Jay is a 76 y.o. female.  Patient c/o pain to base right great toe area for the past couple of days. Pain is constant, dull, moderate-severe, worse w palpation.  Patient indicates she has hx of gout, feels similar. Pt also c/o bil shoulder pain. Denies injury/trauma to any of these areas. No radicular pain. No numbness/weakness. Patient denies fever or chills. Does not feel sick/ill. No nv.    The history is provided by the patient.    Past Medical History:  Diagnosis Date  . Anemia   . Arthritis   . Asthma   . COPD (chronic obstructive pulmonary disease) (HCC)   . Coronary artery disease   . Diabetes mellitus without complication (HCC)    BORDERLINE  . GERD (gastroesophageal reflux disease)   . H/O hiatal hernia   . Hypertension   . Shortness of breath   . Sleep apnea     Patient Active Problem List   Diagnosis Date Noted  . Epigastric pain   . Chest pain, musculoskeletal 12/03/2015  . 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 Onychomycosis 09/01/2012  . Pain in joint, ankle and foot 09/01/2012  . Dermatitis 09/01/2012  . Hereditary and idiopathic peripheral neuropathy 09/01/2012  . S/P PTCA (percutaneous transluminal coronary angioplasty) 07/05/2012  . Essential hypertension 07/05/2012  . Hyperlipidemia 07/05/2012    Past Surgical History:  Procedure Laterality Date  . ABDOMINAL HYSTERECTOMY     partial  . CORONARY ANGIOPLASTY WITH STENT PLACEMENT  07/04/2012   mid LAD  . LEFT AND RIGHT HEART CATHETERIZATION WITH CORONARY ANGIOGRAM N/A 07/04/2012   Procedure: LEFT AND RIGHT HEART CATHETERIZATION WITH CORONARY ANGIOGRAM;  Surgeon: Pamella PertJagadeesh R Ganji, MD;  [facility];  Service: Cardiovascular;  Laterality: N/A;  . LEFT  HEART CATHETERIZATION WITH CORONARY ANGIOGRAM N/A 10/17/2012   Procedure: LEFT HEART CATHETERIZATION WITH CORONARY ANGIOGRAM;  Surgeon: Pamella PertJagadeesh R Ganji, MD;  [facility];  Service: Cardiovascular;  Laterality: N/A;  . PERCUTANEOUS CORONARY STENT INTERVENTION (PCI-S)  07/04/2012   Procedure: PERCUTANEOUS CORONARY STENT INTERVENTION (PCI-S);  Surgeon: Pamella PertJagadeesh R Ganji, MD;  [facility];  Service: Cardiovascular;;  . TUBAL LIGATION      OB History    No data available       Home Medications    Prior to Admission medications   Medication Sig Start Date End Date Taking? Authorizing Provider  albuterol (PROVENTIL HFA;VENTOLIN HFA) 108 (90 BASE) MCG/ACT inhaler Inhale 2 puffs into the lungs 2 (two) times daily as needed for wheezing or shortness of breath.    Historical Provider, MD  amLODipine-valsartan (EXFORGE) 10-320 MG per tablet Take 1 tablet by mouth every morning.    Historical Provider, MD  aspirin EC 81 MG tablet Take 81 mg by mouth daily.    Historical Provider, MD  atorvastatin (LIPITOR) 80 MG tablet Take 80 mg by mouth daily.    Historical Provider, MD  b complex vitamins tablet Take 1 tablet by mouth daily.    Historical Provider, MD  budesonide-formoterol (SYMBICORT) 160-4.5 MCG/ACT inhaler Inhale 2 puffs into the lungs daily.    Historical Provider, MD  Calcium Carbonate-Vitamin D (CALCIUM + D PO) Take 1 tablet by mouth daily.    Historical  Provider, MD  clobetasol cream (TEMOVATE) 0.05 % Apply 1 application topically daily. Apply to rash.    Historical Provider, MD  Cyanocobalamin (VITAMIN B-12 PO) Take 1 tablet by mouth daily.     Historical Provider, MD  EPINEPHrine (EPI-PEN) 0.3 mg/0.3 mL SOAJ Inject 0.3 mg into the muscle once as needed (for allergic reaction to pollen).    Historical Provider, MD  gabapentin (NEURONTIN) 300 MG capsule Take 300 mg by mouth at bedtime.    Historical Provider, MD  hydrALAZINE (APRESOLINE) 50 MG tablet Take 50 mg by mouth 2  (two) times daily.     Historical Provider, MD  hydrochlorothiazide (HYDRODIURIL) 25 MG tablet Take 1 tablet (25 mg total) by mouth daily. 12/04/15   Rodolph Bong, MD  HYDROcodone-acetaminophen (NORCO/VICODIN) 5-325 MG per tablet Take 1 tablet by mouth every 6 (six) hours as needed for pain.    Historical Provider, MD  hydrOXYzine (VISTARIL) 50 MG capsule Take 50 mg by mouth 4 (four) times daily as needed for itching.    Historical Provider, MD  mirtazapine (REMERON) 15 MG tablet Take 15 mg by mouth at bedtime as needed (for sleep).    Historical Provider, MD  nebivolol 20 MG TABS Take 1 tablet (20 mg total) by mouth daily. 12/05/15   Rodolph Bong, MD  nitroGLYCERIN (NITROSTAT) 0.4 MG SL tablet Place 1 tablet (0.4 mg total) under the tongue every 5 (five) minutes as needed for chest pain. 12/04/15   Rodolph Bong, MD  pantoprazole (PROTONIX) 40 MG tablet Take 40 mg by mouth daily.     Historical Provider, MD  Polyvinyl Alcohol-Povidone (REFRESH OP) Place 1 drop into both eyes daily as needed (for dry eyes).    Historical Provider, MD  promethazine (PHENERGAN) 25 MG tablet Take 25 mg by mouth every 6 (six) hours as needed for nausea.    Historical Provider, MD  tiotropium (SPIRIVA) 18 MCG inhalation capsule Place 18 mcg into inhaler and inhale daily.    Historical Provider, MD  traMADol (ULTRAM) 50 MG tablet Take 1 tablet (50 mg total) by mouth every 6 (six) hours as needed. Take 1 tablet 3 times daily x 5 days, then every 6 hours as needed. 12/04/15   Rodolph Bong, MD    Family History Family History  Problem Relation Age of Onset  . Hyperlipidemia Sister   . Heart attack Sister   . Diabetes Daughter     Social History Social History  Substance Use Topics  . Smoking status: Never Smoker  . Smokeless tobacco: Never Used  . Alcohol use No     Allergies   Ciprofloxacin and Erythromycin   Review of Systems Review of Systems  Constitutional: Negative for chills and  fever.  HENT: Negative for sore throat.   Respiratory: Negative for cough and shortness of breath.   Cardiovascular: Negative for chest pain.  Gastrointestinal: Negative for vomiting.  Musculoskeletal: Positive for arthralgias.  Skin: Negative for rash and wound.     Physical Exam Updated Vital Signs BP 137/70 (BP Location: Left Arm)   Pulse 72   Temp 98.4 F (36.9 C) (Oral)   Resp 18   Ht 5\' 7"  (1.702 m)   Wt 122.5 kg   SpO2 98%   BMI 42.29 kg/m   Physical Exam  Constitutional: She appears well-developed and well-nourished. No distress.  HENT:  Mouth/Throat: Oropharynx is clear and moist.  Eyes: Conjunctivae are normal. No scleral icterus.  Neck: Neck supple. No tracheal deviation  present.  Cardiovascular: Normal rate and intact distal pulses.   Pulmonary/Chest: Effort normal. No respiratory distress.  Abdominal: Normal appearance.  Musculoskeletal: She exhibits no edema.  Mod swelling, tenderness, and increased warmth to right 1st mtp region. Skin is intact. No cellulitis. Dp/pt 2+. Normal cap refill distally in toes.   Mild pain/tenderness to bilateral shoulder, no pain w passive rom joints. No erythema or increased warmth. Radial pulse 2+ bil.   Neurological: She is alert.  Skin: Skin is warm and dry. No rash noted. She is not diaphoretic.  Psychiatric: She has a normal mood and affect.  Nursing note and vitals reviewed.    ED Treatments / Results  Labs (all labs ordered are listed, but only abnormal results are displayed) Labs Reviewed - No data to display  EKG  EKG Interpretation None       Radiology No results found.  Procedures Procedures (including critical care time)  Medications Ordered in ED Medications  predniSONE (DELTASONE) tablet 60 mg (not administered)  HYDROcodone-acetaminophen (NORCO/VICODIN) 5-325 MG per tablet 2 tablet (not administered)     Initial Impression / Assessment and Plan / ED Course  I have reviewed the triage vital  signs and the nursing notes.  Pertinent labs & imaging results that were available during my care of the patient were reviewed by me and considered in my medical decision making (see chart for details).  Right mtp/base great toe felt most c/w gout.  Confirmed no drug allergies other than cipro and emycin w pt.   Pt has ride, does not have to drive. pred po, hydrocodone po.  Patient currently appears stable for d/c.  Return precautions provided.    Final Clinical Impressions(s) / ED Diagnoses   Final diagnoses:  None    New Prescriptions New Prescriptions   No medications on file     Cathren Laine, MD 05/14/16 1302

## 2016-05-15 DIAGNOSIS — J45909 Unspecified asthma, uncomplicated: Secondary | ICD-10-CM | POA: Diagnosis not present

## 2016-06-10 DIAGNOSIS — E1151 Type 2 diabetes mellitus with diabetic peripheral angiopathy without gangrene: Secondary | ICD-10-CM | POA: Diagnosis not present

## 2016-06-10 DIAGNOSIS — I10 Essential (primary) hypertension: Secondary | ICD-10-CM | POA: Diagnosis not present

## 2016-06-10 DIAGNOSIS — J302 Other seasonal allergic rhinitis: Secondary | ICD-10-CM | POA: Diagnosis not present

## 2016-06-10 DIAGNOSIS — E785 Hyperlipidemia, unspecified: Secondary | ICD-10-CM | POA: Diagnosis not present

## 2016-06-10 DIAGNOSIS — R609 Edema, unspecified: Secondary | ICD-10-CM | POA: Diagnosis not present

## 2016-06-15 DIAGNOSIS — J45909 Unspecified asthma, uncomplicated: Secondary | ICD-10-CM | POA: Diagnosis not present

## 2016-06-30 ENCOUNTER — Emergency Department (HOSPITAL_BASED_OUTPATIENT_CLINIC_OR_DEPARTMENT_OTHER)
Admission: EM | Admit: 2016-06-30 | Discharge: 2016-06-30 | Disposition: A | Discharge status: Home / Self Care | Payer: Medicare Other | Attending: Emergency Medicine | Admitting: Emergency Medicine

## 2016-06-30 ENCOUNTER — Emergency Department (HOSPITAL_BASED_OUTPATIENT_CLINIC_OR_DEPARTMENT_OTHER): Payer: Medicare Other

## 2016-06-30 ENCOUNTER — Encounter (HOSPITAL_BASED_OUTPATIENT_CLINIC_OR_DEPARTMENT_OTHER): Payer: Self-pay

## 2016-06-30 DIAGNOSIS — E119 Type 2 diabetes mellitus without complications: Secondary | ICD-10-CM | POA: Diagnosis not present

## 2016-06-30 DIAGNOSIS — J45909 Unspecified asthma, uncomplicated: Secondary | ICD-10-CM | POA: Insufficient documentation

## 2016-06-30 DIAGNOSIS — R630 Anorexia: Secondary | ICD-10-CM | POA: Diagnosis not present

## 2016-06-30 DIAGNOSIS — I10 Essential (primary) hypertension: Secondary | ICD-10-CM | POA: Diagnosis not present

## 2016-06-30 DIAGNOSIS — R140 Abdominal distension (gaseous): Secondary | ICD-10-CM | POA: Insufficient documentation

## 2016-06-30 DIAGNOSIS — Z7982 Long term (current) use of aspirin: Secondary | ICD-10-CM | POA: Diagnosis not present

## 2016-06-30 DIAGNOSIS — R1031 Right lower quadrant pain: Secondary | ICD-10-CM | POA: Diagnosis not present

## 2016-06-30 DIAGNOSIS — R1032 Left lower quadrant pain: Secondary | ICD-10-CM | POA: Insufficient documentation

## 2016-06-30 DIAGNOSIS — R109 Unspecified abdominal pain: Secondary | ICD-10-CM

## 2016-06-30 DIAGNOSIS — R112 Nausea with vomiting, unspecified: Secondary | ICD-10-CM | POA: Insufficient documentation

## 2016-06-30 DIAGNOSIS — Z79899 Other long term (current) drug therapy: Secondary | ICD-10-CM | POA: Diagnosis not present

## 2016-06-30 DIAGNOSIS — K5900 Constipation, unspecified: Secondary | ICD-10-CM | POA: Insufficient documentation

## 2016-06-30 DIAGNOSIS — I2510 Atherosclerotic heart disease of native coronary artery without angina pectoris: Secondary | ICD-10-CM | POA: Insufficient documentation

## 2016-06-30 LAB — CBC WITH DIFFERENTIAL/PLATELET
Basophils Absolute: 0 10*3/uL (ref 0.0–0.1)
Basophils Relative: 0 %
EOS PCT: 2 %
Eosinophils Absolute: 0.1 10*3/uL (ref 0.0–0.7)
HEMATOCRIT: 39.1 % (ref 36.0–46.0)
Hemoglobin: 12.5 g/dL (ref 12.0–15.0)
LYMPHS ABS: 3.4 10*3/uL (ref 0.7–4.0)
LYMPHS PCT: 44 %
MCH: 26 pg (ref 26.0–34.0)
MCHC: 32 g/dL (ref 30.0–36.0)
MCV: 81.5 fL (ref 78.0–100.0)
MONO ABS: 0.4 10*3/uL (ref 0.1–1.0)
Monocytes Relative: 5 %
NEUTROS ABS: 4 10*3/uL (ref 1.7–7.7)
Neutrophils Relative %: 49 %
PLATELETS: 321 10*3/uL (ref 150–400)
RBC: 4.8 MIL/uL (ref 3.87–5.11)
RDW: 13.9 % (ref 11.5–15.5)
WBC: 7.9 10*3/uL (ref 4.0–10.5)

## 2016-06-30 LAB — COMPREHENSIVE METABOLIC PANEL
ALK PHOS: 33 U/L — AB (ref 38–126)
ALT: 16 U/L (ref 14–54)
AST: 19 U/L (ref 15–41)
Albumin: 4 g/dL (ref 3.5–5.0)
Anion gap: 9 (ref 5–15)
BUN: 11 mg/dL (ref 6–20)
CALCIUM: 9.4 mg/dL (ref 8.9–10.3)
CHLORIDE: 100 mmol/L — AB (ref 101–111)
CO2: 30 mmol/L (ref 22–32)
CREATININE: 0.8 mg/dL (ref 0.44–1.00)
GFR calc Af Amer: >60 mL/min (ref >60)
GFR calc non Af Amer: >60 mL/min (ref >60)
GLUCOSE: 116 mg/dL — AB (ref 65–99)
Potassium: 2.9 mmol/L — ABNORMAL LOW (ref 3.5–5.1)
Sodium: 139 mmol/L (ref 135–145)
Total Bilirubin: 0.9 mg/dL (ref 0.3–1.2)
Total Protein: 7.1 g/dL (ref 6.5–8.1)

## 2016-06-30 LAB — URINALYSIS, ROUTINE W REFLEX MICROSCOPIC
GLUCOSE, UA: NEGATIVE mg/dL
HGB URINE DIPSTICK: NEGATIVE
Ketones, ur: NEGATIVE mg/dL
LEUKOCYTES UA: NEGATIVE
Nitrite: NEGATIVE
PH: 5 (ref 5.0–8.0)
Protein, ur: 30 mg/dL — AB
SPECIFIC GRAVITY, URINE: 1.023 (ref 1.005–1.030)

## 2016-06-30 LAB — LIPASE, BLOOD: Lipase: 14 U/L (ref 11–51)

## 2016-06-30 LAB — URINALYSIS, MICROSCOPIC (REFLEX)

## 2016-06-30 MED ORDER — SODIUM CHLORIDE 0.9 % IV BOLUS (SEPSIS)
500.0000 mL | Freq: Once | INTRAVENOUS | Status: AC
  Administered 2016-06-30: 500 mL via INTRAVENOUS

## 2016-06-30 MED ORDER — ONDANSETRON 4 MG PO TBDP: 4.0000 mg | ORAL_TABLET | Freq: Three times a day (TID) | ORAL | 0 refills | Status: DC | PRN

## 2016-06-30 MED ORDER — FENTANYL CITRATE (PF) 100 MCG/2ML IJ SOLN
25.0000 ug | Freq: Once | INTRAMUSCULAR | Status: AC
  Administered 2016-06-30: 25 ug via INTRAVENOUS
  Filled 2016-06-30: qty 2

## 2016-06-30 MED ORDER — IOPAMIDOL (ISOVUE-300) INJECTION 61%
100.0000 mL | Freq: Once | INTRAVENOUS | Status: AC | PRN
  Administered 2016-06-30: 100 mL via INTRAVENOUS

## 2016-06-30 MED ORDER — POLYETHYLENE GLYCOL 3350 17 G PO PACK: 17.0000 g | PACK | Freq: Every day | ORAL | 0 refills | Status: DC

## 2016-06-30 MED ORDER — ONDANSETRON HCL 4 MG/2ML IJ SOLN
4.0000 mg | Freq: Once | INTRAMUSCULAR | Status: AC
  Administered 2016-06-30: 4 mg via INTRAVENOUS
  Filled 2016-06-30: qty 2

## 2016-06-30 NOTE — ED Notes (Signed)
ED Provider at bedside. 

## 2016-06-30 NOTE — ED Triage Notes (Signed)
c/o abd pain that started LUQ, LLQ then RUE, nausea x "few days"-NAD-steady gait

## 2016-06-30 NOTE — ED Provider Notes (Signed)
[facility]   CSN: [id number] Arrival date & time: 06/30/16  1554     History   Chief Complaint Chief Complaint  Patient presents with  . Abdominal Pain    HPI Angie Hill is a 76 y.o. female.  HPI Patient presents with abdominal pain for last few days. Start in left upper abdomen that has moved to left lower and now right abdomen also. No fevers or chills. She has had nausea without vomiting. Has had constipation which is not unusual for her. Last bowel movement was yesterday. States feels as if she needs to pass gas and throw up but has not been able to do either. Has had previous hysterectomy. No dysuria. States her abdomen has gotten more swollen. Pain is dull. Has had a decreased appetite. States she thinks she has lost around 5 pounds.   Past Medical History:  Diagnosis Date  . Anemia   . Arthritis   . Asthma   . COPD (chronic obstructive pulmonary disease) (HCC)   . Coronary artery disease   . Diabetes mellitus without complication (HCC)    BORDERLINE  . GERD (gastroesophageal reflux disease)   . H/O hiatal hernia   . Hypertension   . Shortness of breath   . Sleep apnea     Patient Active Problem List   Diagnosis Date Noted  . Epigastric pain   . Chest pain, musculoskeletal 12/03/2015  . 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 Onychomycosis 09/01/2012  . Pain in joint, ankle and foot 09/01/2012  . Dermatitis 09/01/2012  . Hereditary and idiopathic peripheral neuropathy 09/01/2012  . S/P PTCA (percutaneous transluminal coronary angioplasty) 07/05/2012  . Essential hypertension 07/05/2012  . Hyperlipidemia 07/05/2012    Past Surgical History:  Procedure Laterality Date  . ABDOMINAL HYSTERECTOMY     partial  . CORONARY ANGIOPLASTY WITH STENT PLACEMENT  07/04/2012   mid LAD  . LEFT AND RIGHT HEART CATHETERIZATION WITH CORONARY ANGIOGRAM N/A 07/04/2012   Procedure: LEFT AND RIGHT HEART CATHETERIZATION  WITH CORONARY ANGIOGRAM;  Surgeon: Pamella Pert, MD;  Location: [facility] CATH LAB;  Service: Cardiovascular;  Laterality: N/A;  . LEFT HEART CATHETERIZATION WITH CORONARY ANGIOGRAM N/A 10/17/2012   Procedure: LEFT HEART CATHETERIZATION WITH CORONARY ANGIOGRAM;  Surgeon: Pamella Pert, MD;  Location: [facility] CATH LAB;  Service: Cardiovascular;  Laterality: N/A;  . PERCUTANEOUS CORONARY STENT INTERVENTION (PCI-S)  07/04/2012   Procedure: PERCUTANEOUS CORONARY STENT INTERVENTION (PCI-S);  Surgeon: Pamella Pert, MD;  Location: [facility] CATH LAB;  Service: Cardiovascular;;  . TUBAL LIGATION      OB History    No data available       Home Medications    Prior to Admission medications   Medication Sig Start Date End Date Taking? Authorizing Provider  albuterol (PROVENTIL HFA;VENTOLIN HFA) 108 (90 BASE) MCG/ACT inhaler Inhale 2 puffs into the lungs 2 (two) times daily as needed for wheezing or shortness of breath.    Historical Provider, MD  amLODipine-valsartan (EXFORGE) 10-320 MG per tablet Take 1 tablet by mouth every morning.    Historical Provider, MD  aspirin EC 81 MG tablet Take 81 mg by mouth daily.    Historical Provider, MD  atorvastatin (LIPITOR) 80 MG tablet Take 80 mg by mouth daily.    Historical Provider, MD  b complex vitamins tablet Take 1 tablet by mouth daily.    Historical Provider, MD  budesonide-formoterol (SYMBICORT) 160-4.5 MCG/ACT inhaler Inhale 2 puffs into the  lungs daily.    Historical Provider, MD  Calcium Carbonate-Vitamin D (CALCIUM + D PO) Take 1 tablet by mouth daily.    Historical Provider, MD  clobetasol cream (TEMOVATE) 0.05 % Apply 1 application topically daily. Apply to rash.    Historical Provider, MD  Cyanocobalamin (VITAMIN B-12 PO) Take 1 tablet by mouth daily.     Historical Provider, MD  EPINEPHrine (EPI-PEN) 0.3 mg/0.3 mL SOAJ Inject 0.3 mg into the muscle once as needed (for allergic reaction to pollen).    Historical Provider, MD  gabapentin (NEURONTIN)  300 MG capsule Take 300 mg by mouth at bedtime.    Historical Provider, MD  hydrALAZINE (APRESOLINE) 50 MG tablet Take 50 mg by mouth 2 (two) times daily.     Historical Provider, MD  hydrochlorothiazide (HYDRODIURIL) 25 MG tablet Take 1 tablet (25 mg total) by mouth daily. 12/04/15   Rodolph Bong, MD  HYDROcodone-acetaminophen (NORCO/VICODIN) 5-325 MG per tablet Take 1 tablet by mouth every 6 (six) hours as needed for pain.    Historical Provider, MD  HYDROcodone-acetaminophen (NORCO/VICODIN) 5-325 MG tablet Take 1-2 tablets by mouth every 6 (six) hours as needed for moderate pain. 05/14/16   Cathren Laine, MD  hydrOXYzine (VISTARIL) 50 MG capsule Take 50 mg by mouth 4 (four) times daily as needed for itching.    Historical Provider, MD  mirtazapine (REMERON) 15 MG tablet Take 15 mg by mouth at bedtime as needed (for sleep).    Historical Provider, MD  nebivolol 20 MG TABS Take 1 tablet (20 mg total) by mouth daily. 12/05/15   Rodolph Bong, MD  nitroGLYCERIN (NITROSTAT) 0.4 MG SL tablet Place 1 tablet (0.4 mg total) under the tongue every 5 (five) minutes as needed for chest pain. 12/04/15   Rodolph Bong, MD  ondansetron (ZOFRAN-ODT) 4 MG disintegrating tablet Take 1 tablet (4 mg total) by mouth every 8 (eight) hours as needed for nausea or vomiting. 06/30/16   Benjiman Core, MD  pantoprazole (PROTONIX) 40 MG tablet Take 40 mg by mouth daily.     Historical Provider, MD  polyethylene glycol (MIRALAX) packet Take 17 g by mouth daily. 06/30/16   Benjiman Core, MD  Polyvinyl Alcohol-Povidone (REFRESH OP) Place 1 drop into both eyes daily as needed (for dry eyes).    Historical Provider, MD  predniSONE (DELTASONE) 20 MG tablet 3 po once a day for 1 days, then 2 po once a day for 2 days, then 1 po once a day for 2 days 05/15/16   Cathren Laine, MD  promethazine (PHENERGAN) 25 MG tablet Take 25 mg by mouth every 6 (six) hours as needed for nausea.    Historical Provider, MD  tiotropium (SPIRIVA)  18 MCG inhalation capsule Place 18 mcg into inhaler and inhale daily.    Historical Provider, MD  traMADol (ULTRAM) 50 MG tablet Take 1 tablet (50 mg total) by mouth every 6 (six) hours as needed. Take 1 tablet 3 times daily x 5 days, then every 6 hours as needed. 12/04/15   Rodolph Bong, MD    Family History Family History  Problem Relation Age of Onset  . Hyperlipidemia Sister   . Heart attack Sister   . Diabetes Daughter     Social History Social History  Substance Use Topics  . Smoking status: Never Smoker  . Smokeless tobacco: Never Used  . Alcohol use No     Allergies   Ciprofloxacin and Erythromycin   Review of Systems Review  of Systems  Constitutional: Positive for appetite change.  HENT: Negative for congestion.   Respiratory: Negative for chest tightness.   Cardiovascular: Negative for chest pain.  Gastrointestinal: Positive for abdominal distention, abdominal pain, constipation, nausea and vomiting.  Genitourinary: Negative for dyspareunia and flank pain.  Musculoskeletal: Negative for back pain.  Neurological: Negative for light-headedness.  Hematological: Negative for adenopathy.  Psychiatric/Behavioral: Negative for confusion.     Physical Exam Updated Vital Signs BP (!) 157/67 (BP Location: Right Arm)   Pulse 77   Temp 98.8 F (37.1 C) (Oral)   Resp 18   Ht  (1.727 m)   Wt 270 lb (122.5 kg)   SpO2 97%   BMI 41.05 kg/m   Physical Exam  Constitutional: She appears well-developed.  HENT:  Head: Atraumatic.  Eyes: Pupils are equal, round, and reactive to light.  Cardiovascular: Normal rate.   Abdominal: She exhibits distension.  Mild distention. Right lower left lower quadrant tenderness. No rebound or guarding. No hernias palpated.  Musculoskeletal: She exhibits no edema.  Neurological: She is alert.  Skin: Skin is warm. Capillary refill takes less than 2 seconds.  Psychiatric: She has a normal mood and affect.     ED Treatments  / Results  Labs (all labs ordered are listed, but only abnormal results are displayed) Labs Reviewed  URINALYSIS, ROUTINE W REFLEX MICROSCOPIC - Abnormal; Notable for the following:       Result Value   Color, Urine AMBER (*)    APPearance CLOUDY (*)    Bilirubin Urine SMALL (*)    Protein, ur 30 (*)    All other components within normal limits  COMPREHENSIVE METABOLIC PANEL - Abnormal; Notable for the following:    Potassium 2.9 (*)    Chloride 100 (*)    Glucose, Bld 116 (*)    Alkaline Phosphatase 33 (*)    All other components within normal limits  URINALYSIS, MICROSCOPIC (REFLEX) - Abnormal; Notable for the following:    Bacteria, UA RARE (*)    Squamous Epithelial / LPF 0-5 (*)    All other components within normal limits  CBC WITH DIFFERENTIAL/PLATELET  LIPASE, BLOOD    EKG  EKG Interpretation  Date/Time:  Wednesday June 30 2016 16:19:40 EDT Ventricular Rate:  66 PR Interval:  242 QRS Duration: 94 QT Interval:  418 QTC Calculation: 438 R Axis:   42 Text Interpretation:  Sinus rhythm with 1st degree A-V block Moderate voltage criteria for LVH, may be normal variant Nonspecific T wave abnormality Abnormal ECG Confirmed by Rubin Payor  MD, Terasa Orsini [id number]) on 06/30/2016 4:55:27 PM       Radiology Ct Abdomen Pelvis W Contrast  Result Date: 06/30/2016 CLINICAL DATA:  Acute onset of generalized abdominal pain. Abdominal distention. Chronic constipation. Initial encounter. EXAM: CT ABDOMEN AND PELVIS WITH CONTRAST TECHNIQUE: Multidetector CT imaging of the abdomen and pelvis was performed using the standard protocol following bolus administration of intravenous contrast. CONTRAST:  ISOVUE-300 IOPAMIDOL (ISOVUE-300) INJECTION 61% COMPARISON:  Abdominal ultrasound performed 12/04/2015, and MRI of the abdomen performed 09/25/2014 FINDINGS: Lower chest: Mild bibasilar atelectasis or scarring is noted. The visualized portions of the mediastinum are grossly unremarkable.  Hepatobiliary: The liver is unremarkable in appearance. The gallbladder is unremarkable in appearance. The common bile duct remains normal in caliber. Pancreas: The pancreas is within normal limits. Spleen: The spleen is unremarkable in appearance. Adrenals/Urinary Tract: The adrenal glands are unremarkable in appearance. Small bilateral renal cysts are seen. There is no evidence  of hydronephrosis. No renal or ureteral stones are identified. No perinephric stranding is seen. Stomach/Bowel: The stomach is unremarkable in appearance. The small bowel is within normal limits. The patient is status post appendectomy. Scattered diverticulosis is noted along the the descending colon, without evidence of diverticulitis. The sigmoid colon anastomosis is grossly unremarkable in appearance. Vascular/Lymphatic: Scattered calcification is seen along the abdominal aorta and its branches. The abdominal aorta is otherwise grossly unremarkable. The inferior vena cava is grossly unremarkable. No retroperitoneal lymphadenopathy is seen. No pelvic sidewall lymphadenopathy is identified. Reproductive: The bladder is mildly distended and grossly unremarkable. The patient is status post hysterectomy. No suspicious adnexal masses are seen. Other: No additional soft tissue abnormalities are seen. Musculoskeletal: No acute osseous abnormalities are identified. Multilevel vacuum phenomenon is noted along the lower thoracic and lumbar spine. Facet disease is noted along the lumbar spine. The visualized musculature is unremarkable in appearance. IMPRESSION: 1. No acute abnormality seen within the abdomen or pelvis. 2. Scattered aortic atherosclerosis. 3. Scattered diverticulosis along the descending colon, without evidence of diverticulitis. 4. Mild degenerative change along the lower thoracic and lumbar spine. 5. Mild bibasilar atelectasis or scarring noted. 6. Small bilateral renal cysts seen. Electronically Signed   By: Roanna Raider M.D.    On: 06/30/2016 19:08    Procedures Procedures (including critical care time)  Medications Ordered in ED Medications  sodium chloride 0.9 % bolus 500 mL (0 mLs Intravenous Stopped 06/30/16 1749)  ondansetron (ZOFRAN) injection 4 mg (4 mg Intravenous Given 06/30/16 1707)  iopamidol (ISOVUE-300) 61 % injection 100 mL (100 mLs Intravenous Contrast Given 06/30/16 1853)  fentaNYL (SUBLIMAZE) injection 25 mcg (25 mcg Intravenous Given 06/30/16 1947)     Initial Impression / Assessment and Plan / ED Course  I have reviewed the triage vital signs and the nursing notes.  Pertinent labs & imaging results that were available during my care of the patient were reviewed by me and considered in my medical decision making (see chart for details).     Patient with abdominal pain. Has had some nausea. The pain has moved around. Labs reassuring. CT scan done and negative. Will discharge home. Will give short course of neuroleptics since she has had some constipation.  Final Clinical Impressions(s) / ED Diagnoses   Final diagnoses:  Abdominal pain, unspecified abdominal location    New Prescriptions New Prescriptions   ONDANSETRON (ZOFRAN-ODT) 4 MG DISINTEGRATING TABLET    Take 1 tablet (4 mg total) by mouth every 8 (eight) hours as needed for nausea or vomiting.   POLYETHYLENE GLYCOL (MIRALAX) PACKET    Take 17 g by mouth daily.     Benjiman Core, MD 06/30/16 2034

## 2016-07-15 DIAGNOSIS — J45909 Unspecified asthma, uncomplicated: Secondary | ICD-10-CM | POA: Diagnosis not present

## 2016-08-15 DIAGNOSIS — J45909 Unspecified asthma, uncomplicated: Secondary | ICD-10-CM | POA: Diagnosis not present

## 2016-08-20 DIAGNOSIS — R609 Edema, unspecified: Secondary | ICD-10-CM | POA: Diagnosis not present

## 2016-08-20 DIAGNOSIS — E1151 Type 2 diabetes mellitus with diabetic peripheral angiopathy without gangrene: Secondary | ICD-10-CM | POA: Diagnosis not present

## 2016-08-20 DIAGNOSIS — J449 Chronic obstructive pulmonary disease, unspecified: Secondary | ICD-10-CM | POA: Diagnosis not present

## 2016-08-20 DIAGNOSIS — H539 Unspecified visual disturbance: Secondary | ICD-10-CM | POA: Diagnosis not present

## 2016-08-20 DIAGNOSIS — M2550 Pain in unspecified joint: Secondary | ICD-10-CM | POA: Diagnosis not present

## 2016-08-20 DIAGNOSIS — J302 Other seasonal allergic rhinitis: Secondary | ICD-10-CM | POA: Diagnosis not present

## 2016-08-20 DIAGNOSIS — E785 Hyperlipidemia, unspecified: Secondary | ICD-10-CM | POA: Diagnosis not present

## 2016-08-20 DIAGNOSIS — I10 Essential (primary) hypertension: Secondary | ICD-10-CM | POA: Diagnosis not present

## 2016-08-20 DIAGNOSIS — Z0110 Encounter for examination of ears and hearing without abnormal findings: Secondary | ICD-10-CM | POA: Diagnosis not present

## 2016-09-14 DIAGNOSIS — J45909 Unspecified asthma, uncomplicated: Secondary | ICD-10-CM | POA: Diagnosis not present

## 2016-09-21 DIAGNOSIS — K219 Gastro-esophageal reflux disease without esophagitis: Secondary | ICD-10-CM | POA: Diagnosis not present

## 2016-09-21 DIAGNOSIS — E1151 Type 2 diabetes mellitus with diabetic peripheral angiopathy without gangrene: Secondary | ICD-10-CM | POA: Diagnosis not present

## 2016-09-21 DIAGNOSIS — E785 Hyperlipidemia, unspecified: Secondary | ICD-10-CM | POA: Diagnosis not present

## 2016-09-21 DIAGNOSIS — E1140 Type 2 diabetes mellitus with diabetic neuropathy, unspecified: Secondary | ICD-10-CM | POA: Diagnosis not present

## 2016-09-21 DIAGNOSIS — I10 Essential (primary) hypertension: Secondary | ICD-10-CM | POA: Diagnosis not present

## 2016-09-24 DIAGNOSIS — I10 Essential (primary) hypertension: Secondary | ICD-10-CM | POA: Diagnosis not present

## 2016-09-24 DIAGNOSIS — R9431 Abnormal electrocardiogram [ECG] [EKG]: Secondary | ICD-10-CM | POA: Diagnosis not present

## 2016-09-27 DIAGNOSIS — J449 Chronic obstructive pulmonary disease, unspecified: Secondary | ICD-10-CM | POA: Diagnosis not present

## 2016-09-27 DIAGNOSIS — E785 Hyperlipidemia, unspecified: Secondary | ICD-10-CM | POA: Diagnosis not present

## 2016-09-27 DIAGNOSIS — I10 Essential (primary) hypertension: Secondary | ICD-10-CM | POA: Diagnosis not present

## 2016-10-15 DIAGNOSIS — J45909 Unspecified asthma, uncomplicated: Secondary | ICD-10-CM | POA: Diagnosis not present

## 2016-10-15 DIAGNOSIS — K2270 Barrett's esophagus without dysplasia: Secondary | ICD-10-CM | POA: Diagnosis not present

## 2016-10-15 DIAGNOSIS — R1010 Upper abdominal pain, unspecified: Secondary | ICD-10-CM | POA: Diagnosis not present

## 2016-10-15 DIAGNOSIS — K2210 Ulcer of esophagus without bleeding: Secondary | ICD-10-CM | POA: Diagnosis not present

## 2016-10-15 DIAGNOSIS — R1310 Dysphagia, unspecified: Secondary | ICD-10-CM | POA: Diagnosis not present

## 2016-10-15 DIAGNOSIS — K222 Esophageal obstruction: Secondary | ICD-10-CM | POA: Diagnosis not present

## 2016-10-15 DIAGNOSIS — K209 Esophagitis, unspecified: Secondary | ICD-10-CM | POA: Diagnosis not present

## 2016-10-27 DIAGNOSIS — K824 Cholesterolosis of gallbladder: Secondary | ICD-10-CM | POA: Diagnosis not present

## 2016-10-27 DIAGNOSIS — K838 Other specified diseases of biliary tract: Secondary | ICD-10-CM | POA: Diagnosis not present

## 2016-10-29 DIAGNOSIS — E1151 Type 2 diabetes mellitus with diabetic peripheral angiopathy without gangrene: Secondary | ICD-10-CM | POA: Diagnosis not present

## 2016-10-29 DIAGNOSIS — J449 Chronic obstructive pulmonary disease, unspecified: Secondary | ICD-10-CM | POA: Diagnosis not present

## 2016-10-29 DIAGNOSIS — J302 Other seasonal allergic rhinitis: Secondary | ICD-10-CM | POA: Diagnosis not present

## 2016-10-29 DIAGNOSIS — M2550 Pain in unspecified joint: Secondary | ICD-10-CM | POA: Diagnosis not present

## 2016-11-01 DIAGNOSIS — B351 Tinea unguium: Secondary | ICD-10-CM | POA: Diagnosis not present

## 2016-11-01 DIAGNOSIS — E119 Type 2 diabetes mellitus without complications: Secondary | ICD-10-CM | POA: Diagnosis not present

## 2016-11-01 DIAGNOSIS — L84 Corns and callosities: Secondary | ICD-10-CM | POA: Diagnosis not present

## 2016-11-01 DIAGNOSIS — M79672 Pain in left foot: Secondary | ICD-10-CM | POA: Diagnosis not present

## 2016-11-01 DIAGNOSIS — M79671 Pain in right foot: Secondary | ICD-10-CM | POA: Diagnosis not present

## 2016-11-15 DIAGNOSIS — J45909 Unspecified asthma, uncomplicated: Secondary | ICD-10-CM | POA: Diagnosis not present

## 2016-11-19 DIAGNOSIS — Z1231 Encounter for screening mammogram for malignant neoplasm of breast: Secondary | ICD-10-CM | POA: Diagnosis not present

## 2016-11-30 DIAGNOSIS — K449 Diaphragmatic hernia without obstruction or gangrene: Secondary | ICD-10-CM | POA: Diagnosis not present

## 2016-11-30 DIAGNOSIS — Q6102 Congenital multiple renal cysts: Secondary | ICD-10-CM | POA: Diagnosis not present

## 2016-11-30 DIAGNOSIS — N2889 Other specified disorders of kidney and ureter: Secondary | ICD-10-CM | POA: Diagnosis not present

## 2016-11-30 DIAGNOSIS — N289 Disorder of kidney and ureter, unspecified: Secondary | ICD-10-CM | POA: Diagnosis not present

## 2016-11-30 DIAGNOSIS — R109 Unspecified abdominal pain: Secondary | ICD-10-CM | POA: Diagnosis not present

## 2016-12-10 DIAGNOSIS — J302 Other seasonal allergic rhinitis: Secondary | ICD-10-CM | POA: Diagnosis not present

## 2016-12-10 DIAGNOSIS — J449 Chronic obstructive pulmonary disease, unspecified: Secondary | ICD-10-CM | POA: Diagnosis not present

## 2016-12-10 DIAGNOSIS — E785 Hyperlipidemia, unspecified: Secondary | ICD-10-CM | POA: Diagnosis not present

## 2016-12-10 DIAGNOSIS — I10 Essential (primary) hypertension: Secondary | ICD-10-CM | POA: Diagnosis not present

## 2016-12-10 DIAGNOSIS — E1140 Type 2 diabetes mellitus with diabetic neuropathy, unspecified: Secondary | ICD-10-CM | POA: Diagnosis not present

## 2016-12-10 DIAGNOSIS — E1151 Type 2 diabetes mellitus with diabetic peripheral angiopathy without gangrene: Secondary | ICD-10-CM | POA: Diagnosis not present

## 2016-12-10 DIAGNOSIS — M2550 Pain in unspecified joint: Secondary | ICD-10-CM | POA: Diagnosis not present

## 2016-12-15 DIAGNOSIS — J45909 Unspecified asthma, uncomplicated: Secondary | ICD-10-CM | POA: Diagnosis not present

## 2017-01-10 DIAGNOSIS — E785 Hyperlipidemia, unspecified: Secondary | ICD-10-CM | POA: Diagnosis not present

## 2017-01-10 DIAGNOSIS — J302 Other seasonal allergic rhinitis: Secondary | ICD-10-CM | POA: Diagnosis not present

## 2017-01-10 DIAGNOSIS — E1151 Type 2 diabetes mellitus with diabetic peripheral angiopathy without gangrene: Secondary | ICD-10-CM | POA: Diagnosis not present

## 2017-01-10 DIAGNOSIS — I10 Essential (primary) hypertension: Secondary | ICD-10-CM | POA: Diagnosis not present

## 2017-01-10 DIAGNOSIS — Z0000 Encounter for general adult medical examination without abnormal findings: Secondary | ICD-10-CM | POA: Diagnosis not present

## 2017-01-15 DIAGNOSIS — J45909 Unspecified asthma, uncomplicated: Secondary | ICD-10-CM | POA: Diagnosis not present

## 2017-02-02 DIAGNOSIS — E1140 Type 2 diabetes mellitus with diabetic neuropathy, unspecified: Secondary | ICD-10-CM | POA: Diagnosis not present

## 2017-02-02 DIAGNOSIS — L84 Corns and callosities: Secondary | ICD-10-CM | POA: Diagnosis not present

## 2017-02-02 DIAGNOSIS — E119 Type 2 diabetes mellitus without complications: Secondary | ICD-10-CM | POA: Diagnosis not present

## 2017-02-02 DIAGNOSIS — M79671 Pain in right foot: Secondary | ICD-10-CM | POA: Diagnosis not present

## 2017-02-02 DIAGNOSIS — B351 Tinea unguium: Secondary | ICD-10-CM | POA: Diagnosis not present

## 2017-02-02 DIAGNOSIS — M79672 Pain in left foot: Secondary | ICD-10-CM | POA: Diagnosis not present

## 2017-02-02 DIAGNOSIS — I10 Essential (primary) hypertension: Secondary | ICD-10-CM | POA: Diagnosis not present

## 2017-02-14 DIAGNOSIS — J45909 Unspecified asthma, uncomplicated: Secondary | ICD-10-CM | POA: Diagnosis not present

## 2017-03-17 ENCOUNTER — Emergency Department (HOSPITAL_BASED_OUTPATIENT_CLINIC_OR_DEPARTMENT_OTHER): Payer: Medicare Other

## 2017-03-17 ENCOUNTER — Other Ambulatory Visit: Payer: Self-pay

## 2017-03-17 ENCOUNTER — Encounter (HOSPITAL_BASED_OUTPATIENT_CLINIC_OR_DEPARTMENT_OTHER): Payer: Self-pay | Admitting: Emergency Medicine

## 2017-03-17 ENCOUNTER — Emergency Department (HOSPITAL_BASED_OUTPATIENT_CLINIC_OR_DEPARTMENT_OTHER)
Admission: EM | Admit: 2017-03-17 | Discharge: 2017-03-17 | Disposition: A | Discharge status: Home / Self Care | Payer: Medicare Other | Attending: Emergency Medicine | Admitting: Emergency Medicine

## 2017-03-17 DIAGNOSIS — J0100 Acute maxillary sinusitis, unspecified: Secondary | ICD-10-CM

## 2017-03-17 DIAGNOSIS — Z955 Presence of coronary angioplasty implant and graft: Secondary | ICD-10-CM | POA: Insufficient documentation

## 2017-03-17 DIAGNOSIS — Z79899 Other long term (current) drug therapy: Secondary | ICD-10-CM | POA: Insufficient documentation

## 2017-03-17 DIAGNOSIS — R059 Cough, unspecified: Secondary | ICD-10-CM

## 2017-03-17 DIAGNOSIS — I10 Essential (primary) hypertension: Secondary | ICD-10-CM | POA: Diagnosis not present

## 2017-03-17 DIAGNOSIS — J449 Chronic obstructive pulmonary disease, unspecified: Secondary | ICD-10-CM | POA: Insufficient documentation

## 2017-03-17 DIAGNOSIS — Z7982 Long term (current) use of aspirin: Secondary | ICD-10-CM | POA: Insufficient documentation

## 2017-03-17 DIAGNOSIS — J45909 Unspecified asthma, uncomplicated: Secondary | ICD-10-CM | POA: Diagnosis not present

## 2017-03-17 DIAGNOSIS — I440 Atrioventricular block, first degree: Secondary | ICD-10-CM | POA: Diagnosis not present

## 2017-03-17 DIAGNOSIS — I2510 Atherosclerotic heart disease of native coronary artery without angina pectoris: Secondary | ICD-10-CM | POA: Diagnosis not present

## 2017-03-17 DIAGNOSIS — E119 Type 2 diabetes mellitus without complications: Secondary | ICD-10-CM | POA: Diagnosis not present

## 2017-03-17 DIAGNOSIS — J3489 Other specified disorders of nose and nasal sinuses: Secondary | ICD-10-CM | POA: Diagnosis not present

## 2017-03-17 DIAGNOSIS — R05 Cough: Secondary | ICD-10-CM

## 2017-03-17 DIAGNOSIS — R0602 Shortness of breath: Secondary | ICD-10-CM | POA: Diagnosis not present

## 2017-03-17 MED ORDER — BENZONATATE 100 MG PO CAPS: 100.0000 mg | ORAL_CAPSULE | Freq: Three times a day (TID) | ORAL | 0 refills | Status: DC

## 2017-03-17 MED ORDER — AMOXICILLIN 500 MG PO CAPS
500.0000 mg | ORAL_CAPSULE | Freq: Three times a day (TID) | ORAL | Status: DC
  Administered 2017-03-17: 500 mg via ORAL
  Filled 2017-03-17: qty 1

## 2017-03-17 MED ORDER — BENZONATATE 100 MG PO CAPS
100.0000 mg | ORAL_CAPSULE | Freq: Once | ORAL | Status: AC
  Administered 2017-03-17: 100 mg via ORAL
  Filled 2017-03-17: qty 1

## 2017-03-17 MED ORDER — AMOXICILLIN 500 MG PO CAPS: 500.0000 mg | ORAL_CAPSULE | Freq: Three times a day (TID) | ORAL | 0 refills | Status: DC

## 2017-03-17 NOTE — Discharge Instructions (Signed)
Tessalon for cough. Tylenol for pain.  Amoxicillin, antibiotic for your sinus infection.

## 2017-03-17 NOTE — ED Provider Notes (Signed)
[facility] EMERGENCY DEPARTMENT Provider Note   CSN: [id number] Arrival date & time: 03/17/17  1748     History   Chief Complaint Chief Complaint  Patient presents with  . Cough    HPI Angie Hill is a 77 y.o. female. Chief complaint is cough  HPI 77 year old female. Sinus pain first several days. Cough for the last 10 days previous. Fever on day 1 but not since. Feels congested. Cannot breathe through her nose. Keeps her up at night.  No shortness of breath. Nonproductive cough.  Past Medical History:  Diagnosis Date  . Anemia   . Arthritis   . Asthma   . COPD (chronic obstructive pulmonary disease) (HCC)   . Coronary artery disease   . Diabetes mellitus without complication (HCC)    BORDERLINE  . GERD (gastroesophageal reflux disease)   . H/O hiatal hernia   . Hypertension   . Shortness of breath   . Sleep apnea     Patient Active Problem List   Diagnosis Date Noted  . Epigastric pain   . Chest pain, musculoskeletal 12/03/2015  . 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 Onychomycosis 09/01/2012  . Pain in joint, ankle and foot 09/01/2012  . Dermatitis 09/01/2012  . Hereditary and idiopathic peripheral neuropathy 09/01/2012  . S/P PTCA (percutaneous transluminal coronary angioplasty) 07/05/2012  . Essential hypertension 07/05/2012  . Hyperlipidemia 07/05/2012    Past Surgical History:  Procedure Laterality Date  . ABDOMINAL HYSTERECTOMY     partial  . CORONARY ANGIOPLASTY WITH STENT PLACEMENT  07/04/2012   mid LAD  . LEFT AND RIGHT HEART CATHETERIZATION WITH CORONARY ANGIOGRAM N/A 07/04/2012   Procedure: LEFT AND RIGHT HEART CATHETERIZATION WITH CORONARY ANGIOGRAM;  Surgeon: Pamella Pert, MD;  Location: [facility] CATH LAB;  Service: Cardiovascular;  Laterality: N/A;  . LEFT HEART CATHETERIZATION WITH CORONARY ANGIOGRAM N/A 10/17/2012   Procedure: LEFT HEART CATHETERIZATION WITH CORONARY ANGIOGRAM;  Surgeon: Pamella Pert, MD;  Location: [facility] CATH LAB;  Service: Cardiovascular;  Laterality: N/A;  . PERCUTANEOUS CORONARY STENT INTERVENTION (PCI-S)  07/04/2012   Procedure: PERCUTANEOUS CORONARY STENT INTERVENTION (PCI-S);  Surgeon: Pamella Pert, MD;  Location: [facility] CATH LAB;  Service: Cardiovascular;;  . TUBAL LIGATION      OB History    No data available       Home Medications    Prior to Admission medications   Medication Sig Start Date End Date Taking? Authorizing Provider  albuterol (PROVENTIL HFA;VENTOLIN HFA) 108 (90 BASE) MCG/ACT inhaler Inhale 2 puffs into the lungs 2 (two) times daily as needed for wheezing or shortness of breath.    [provider]  amLODipine-valsartan (EXFORGE) 10-320 MG per tablet Take 1 tablet by mouth every morning.    [provider]  amoxicillin (AMOXIL) 500 MG capsule Take 1 capsule (500 mg total) by mouth 3 (three) times daily. 03/17/17   Rolland Porter, MD  aspirin EC 81 MG tablet Take 81 mg by mouth daily.    [provider]  atorvastatin (LIPITOR) 80 MG tablet Take 80 mg by mouth daily.    [provider]  b complex vitamins tablet Take 1 tablet by mouth daily.    [provider]  benzonatate (TESSALON) 100 MG capsule Take 1 capsule (100 mg total) by mouth every 8 (eight) hours. 03/17/17   Rolland Porter, MD  budesonide-formoterol [facility]) 160-4.5 MCG/ACT inhaler Inhale 2 puffs into the lungs daily.    [provider]  Calcium Carbonate-Vitamin D (CALCIUM + D PO) Take 1 tablet by mouth daily.    [provider]  clobetasol cream (TEMOVATE) 0.05 % Apply 1 application topically daily. Apply to rash.    [provider]  Cyanocobalamin (VITAMIN B-12 PO) Take 1 tablet by mouth daily.     [provider]  EPINEPHrine (EPI-PEN) 0.3 mg/0.3 mL SOAJ Inject 0.3 mg into the muscle once as needed (for allergic reaction to pollen).    [provider]  gabapentin (NEURONTIN) 300 MG capsule Take  300 mg by mouth at bedtime.    [provider]  hydrALAZINE (APRESOLINE) 50 MG tablet Take 50 mg by mouth 2 (two) times daily.     [provider]  hydrochlorothiazide (HYDRODIURIL) 25 MG tablet Take 1 tablet (25 mg total) by mouth daily. 12/04/15   Rodolph Bong, MD  HYDROcodone-acetaminophen (NORCO/VICODIN) 5-325 MG per tablet Take 1 tablet by mouth every 6 (six) hours as needed for pain.    [provider]  HYDROcodone-acetaminophen (NORCO/VICODIN) 5-325 MG tablet Take 1-2 tablets by mouth every 6 (six) hours as needed for moderate pain. 05/14/16   Cathren Laine, MD  hydrOXYzine (VISTARIL) 50 MG capsule Take 50 mg by mouth 4 (four) times daily as needed for itching.    [provider]  mirtazapine (REMERON) 15 MG tablet Take 15 mg by mouth at bedtime as needed (for sleep).    [provider]  nebivolol 20 MG TABS Take 1 tablet (20 mg total) by mouth daily. 12/05/15   Rodolph Bong, MD  nitroGLYCERIN (NITROSTAT) 0.4 MG SL tablet Place 1 tablet (0.4 mg total) under the tongue every 5 (five) minutes as needed for chest pain. 12/04/15   Rodolph Bong, MD  ondansetron (ZOFRAN-ODT) 4 MG disintegrating tablet Take 1 tablet (4 mg total) by mouth every 8 (eight) hours as needed for nausea or vomiting. 06/30/16   Benjiman Core, MD  pantoprazole (PROTONIX) 40 MG tablet Take 40 mg by mouth daily.     [provider]  polyethylene glycol (MIRALAX) packet Take 17 g by mouth daily. 06/30/16   Benjiman Core, MD  Polyvinyl Alcohol-Povidone (REFRESH OP) Place 1 drop into both eyes daily as needed (for dry eyes).    [provider]  predniSONE (DELTASONE) 20 MG tablet 3 po once a day for 1 days, then 2 po once a day for 2 days, then 1 po once a day for 2 days 05/15/16   Cathren Laine, MD  promethazine (PHENERGAN) 25 MG tablet Take 25 mg by mouth every 6 (six) hours as needed for nausea.    [provider]  tiotropium (SPIRIVA) 18  MCG inhalation capsule Place 18 mcg into inhaler and inhale daily.    [provider]  traMADol (ULTRAM) 50 MG tablet Take 1 tablet (50 mg total) by mouth every 6 (six) hours as needed. Take 1 tablet 3 times daily x 5 days, then every 6 hours as needed. 12/04/15   Rodolph Bong, MD    Family History Family History  Problem Relation Age of Onset  . Hyperlipidemia Sister   . Heart attack Sister   . Diabetes Daughter     Social History Social History   Tobacco Use  . Smoking status: Never Smoker  . Smokeless tobacco: Never Used  Substance Use Topics  . Alcohol use: No  . Drug use: No     Allergies   Ciprofloxacin and Erythromycin   Review of  Systems Review of Systems  Constitutional: Negative for appetite change, chills, diaphoresis, fatigue and fever.  HENT: Positive for rhinorrhea and sinus pressure. Negative for mouth sores, sore throat and trouble swallowing.   Eyes: Negative for visual disturbance.  Respiratory: Positive for cough. Negative for chest tightness, shortness of breath and wheezing.   Cardiovascular: Negative for chest pain.  Gastrointestinal: Negative for abdominal distention, abdominal pain, diarrhea, nausea and vomiting.  Endocrine: Negative for polydipsia, polyphagia and polyuria.  Genitourinary: Negative for dysuria, frequency and hematuria.  Musculoskeletal: Negative for gait problem.  Skin: Negative for color change, pallor and rash.  Neurological: Negative for dizziness, syncope, light-headedness and headaches.  Hematological: Does not bruise/bleed easily.  Psychiatric/Behavioral: Negative for behavioral problems and confusion.     Physical Exam Updated Vital Signs BP (!) 157/81 (BP Location: Left Arm)   Pulse 82   Temp 98.2 F (36.8 C) (Oral)   Resp 18   Ht 5\' 8"  (1.727 m)   Wt 122.5 kg (270 lb)   SpO2 99%   BMI 41.05 kg/m   Physical Exam  Constitutional: She is oriented to person, place, and time. She appears  well-developed and well-nourished. No distress.  HENT:  Head: Normocephalic.  Complains of tenderness to palpate over the maxilla. Congested nares. Posterior pharyngeal drainage. No tonsillar hypertrophy or exudate. No adenopathy in the neck.  Eyes: Conjunctivae are normal. Pupils are equal, round, and reactive to light. No scleral icterus.  Neck: Normal range of motion. Neck supple. No thyromegaly present.  Cardiovascular: Normal rate and regular rhythm. Exam reveals no gallop and no friction rub.  No murmur heard. Pulmonary/Chest: Effort normal and breath sounds normal. No respiratory distress. She has no wheezes. She has no rales.  Clear bilateral breath sounds. No wheezing, rales, rhonchi, or prolongation.  Abdominal: Soft. Bowel sounds are normal. She exhibits no distension. There is no tenderness. There is no rebound.  Musculoskeletal: Normal range of motion.  Neurological: She is alert and oriented to person, place, and time.  Skin: Skin is warm and dry. No rash noted.  Psychiatric: She has a normal mood and affect. Her behavior is normal.     ED Treatments / Results  Labs (all labs ordered are listed, but only abnormal results are displayed) Labs Reviewed - No data to display  EKG  EKG Interpretation None       Radiology Dg Chest 2 View  Result Date: 03/17/2017 CLINICAL DATA:  Cough and shortness of breath for several days EXAM: CHEST  2 VIEW COMPARISON:  12/17/2015 FINDINGS: Cardiac shadow is stable. Degenerative changes of the thoracic spine are noted. The lungs are well aerated bilaterally. No focal infiltrate or sizable effusion is seen. IMPRESSION: No active cardiopulmonary disease. Electronically Signed   By: Alcide Clever M.D.   On: 03/17/2017 18:34    Procedures Procedures (including critical care time)  Medications Ordered in ED Medications  amoxicillin (AMOXIL) capsule 500 mg (not administered)  benzonatate (TESSALON) capsule 100 mg (not administered)      Initial Impression / Assessment and Plan / ED Course  I have reviewed the triage vital signs and the nursing notes.  Pertinent labs & imaging results that were available during my care of the patient were reviewed by me and considered in my medical decision making (see chart for details).    URI with cough. Likely pleuritic chest discomfort that is subcostal. Normal chest x-ray. Clear lungs. Well oxygenated. Symptoms consistent with sinusitis greater than 10 days will treat with antibiotics,  Mucinex D, Tessalon Perles cough  Final Clinical Impressions(s) / ED Diagnoses   Final diagnoses:  Acute maxillary sinusitis, recurrence not specified  Cough    ED Discharge Orders        Ordered    amoxicillin (AMOXIL) 500 MG capsule  3 times daily     03/17/17 1937    benzonatate (TESSALON) 100 MG capsule  Every 8 hours     03/17/17 1937       Rolland PorterJames, Panhia Karl, MD 03/17/17 1939

## 2017-03-17 NOTE — ED Triage Notes (Signed)
Productive cough x10 days.  Sinus drainage started today.

## 2017-04-16 ENCOUNTER — Other Ambulatory Visit: Payer: Self-pay

## 2017-04-16 ENCOUNTER — Emergency Department (HOSPITAL_BASED_OUTPATIENT_CLINIC_OR_DEPARTMENT_OTHER): Payer: Medicare Other

## 2017-04-16 ENCOUNTER — Emergency Department (HOSPITAL_BASED_OUTPATIENT_CLINIC_OR_DEPARTMENT_OTHER)
Admission: EM | Admit: 2017-04-16 | Discharge: 2017-04-17 | Disposition: A | Discharge status: Home / Self Care | Payer: Medicare Other | Attending: Emergency Medicine | Admitting: Emergency Medicine

## 2017-04-16 ENCOUNTER — Encounter (HOSPITAL_BASED_OUTPATIENT_CLINIC_OR_DEPARTMENT_OTHER): Payer: Self-pay | Admitting: *Deleted

## 2017-04-16 DIAGNOSIS — Z79899 Other long term (current) drug therapy: Secondary | ICD-10-CM | POA: Diagnosis not present

## 2017-04-16 DIAGNOSIS — I10 Essential (primary) hypertension: Secondary | ICD-10-CM | POA: Diagnosis not present

## 2017-04-16 DIAGNOSIS — I2510 Atherosclerotic heart disease of native coronary artery without angina pectoris: Secondary | ICD-10-CM | POA: Diagnosis not present

## 2017-04-16 DIAGNOSIS — J029 Acute pharyngitis, unspecified: Secondary | ICD-10-CM | POA: Diagnosis not present

## 2017-04-16 DIAGNOSIS — R0602 Shortness of breath: Secondary | ICD-10-CM | POA: Diagnosis not present

## 2017-04-16 DIAGNOSIS — E119 Type 2 diabetes mellitus without complications: Secondary | ICD-10-CM | POA: Insufficient documentation

## 2017-04-16 DIAGNOSIS — Z7982 Long term (current) use of aspirin: Secondary | ICD-10-CM | POA: Diagnosis not present

## 2017-04-16 DIAGNOSIS — Z955 Presence of coronary angioplasty implant and graft: Secondary | ICD-10-CM | POA: Diagnosis not present

## 2017-04-16 DIAGNOSIS — J449 Chronic obstructive pulmonary disease, unspecified: Secondary | ICD-10-CM | POA: Diagnosis not present

## 2017-04-16 DIAGNOSIS — R0981 Nasal congestion: Secondary | ICD-10-CM | POA: Diagnosis not present

## 2017-04-16 DIAGNOSIS — J45909 Unspecified asthma, uncomplicated: Secondary | ICD-10-CM | POA: Insufficient documentation

## 2017-04-16 DIAGNOSIS — J069 Acute upper respiratory infection, unspecified: Secondary | ICD-10-CM

## 2017-04-16 DIAGNOSIS — B9789 Other viral agents as the cause of diseases classified elsewhere: Secondary | ICD-10-CM

## 2017-04-16 DIAGNOSIS — R05 Cough: Secondary | ICD-10-CM | POA: Diagnosis not present

## 2017-04-16 LAB — CBG MONITORING, ED: Glucose-Capillary: 83 mg/dL (ref 65–99)

## 2017-04-16 MED ORDER — ALBUTEROL SULFATE HFA 108 (90 BASE) MCG/ACT IN AERS
1.0000 | INHALATION_SPRAY | Freq: Once | RESPIRATORY_TRACT | Status: AC
  Administered 2017-04-16: 1 via RESPIRATORY_TRACT
  Filled 2017-04-16: qty 6.7

## 2017-04-16 NOTE — ED Triage Notes (Addendum)
Generalized body aches, nausea(deneis vomiting), 'post nasal' drip and 'temple pain' x several days. Cough noted in triage.

## 2017-04-16 NOTE — ED Notes (Signed)
Delay in EKG due to pt in xray 

## 2017-04-16 NOTE — ED Notes (Signed)
EDP into room, prior to RN assessment, see MD notes, pending orders.   

## 2017-04-16 NOTE — ED Notes (Signed)
CBG is 83. 

## 2017-04-16 NOTE — ED Notes (Signed)
Back from xray, [facility]KG in progress[facility] Alert, NAD, calm, interactive, resps e/u, speaking in clear complete sentences, no dyspnea noted, skin W&D, admits to CP, shoulder pain, sob, nausea, and dizziness.

## 2017-04-17 DIAGNOSIS — J45909 Unspecified asthma, uncomplicated: Secondary | ICD-10-CM | POA: Diagnosis not present

## 2017-04-17 MED ORDER — ALBUTEROL SULFATE HFA 108 (90 BASE) MCG/ACT IN AERS: 2.0000 | INHALATION_SPRAY | Freq: Two times a day (BID) | RESPIRATORY_TRACT | 0 refills | Status: DC | PRN

## 2017-04-17 MED ORDER — DEXAMETHASONE SODIUM PHOSPHATE 10 MG/ML IJ SOLN
10.0000 mg | Freq: Once | INTRAMUSCULAR | Status: AC
  Administered 2017-04-17: 10 mg via INTRAMUSCULAR
  Filled 2017-04-17: qty 1

## 2017-04-17 NOTE — Discharge Instructions (Signed)
You were seen today for upper respiratory symptoms including sore throat and cough.  You were given steroids given her history of asthma.  You likely have a viral infection.  Use Tylenol as needed for body aches or pains.  Make sure that you stay hydrated.

## 2017-04-17 NOTE — ED Provider Notes (Signed)
[facility] EMERGENCY DEPARTMENT Provider Note   CSN: [id number] Arrival date & time: 04/16/17  1815     History   Chief Complaint Chief Complaint  Patient presents with  . URI    HPI Angie Hill is a 77 y.o. female.  HPI  This is a 77 year old female with a history of asthma, COPD, diabetes who presents with upper respiratory symptoms.  Patient reports myalgias, cough, sore throat, congestion, voice change over the last 2-3 days.  Cough is nonproductive.  She did use her rescue inhaler with some relief this morning.  She states that she has had a scratchy throat.  No difficulty swallowing.  Denies nausea, vomiting, diarrhea.  Denies fevers.  Denies chills.  Patient reports some left-sided chest discomfort with coughing.  It is only with coughing.  Past Medical History:  Diagnosis Date  . Anemia   . Arthritis   . Asthma   . COPD (chronic obstructive pulmonary disease) (HCC)   . Coronary artery disease   . Diabetes mellitus without complication (HCC)    BORDERLINE  . GERD (gastroesophageal reflux disease)   . H/O hiatal hernia   . Hypertension   . Shortness of breath   . Sleep apnea     Patient Active Problem List   Diagnosis Date Noted  . Epigastric pain   . Chest pain, musculoskeletal 12/03/2015  . 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 Onychomycosis 09/01/2012  . Pain in joint, ankle and foot 09/01/2012  . Dermatitis 09/01/2012  . Hereditary and idiopathic peripheral neuropathy 09/01/2012  . S/P PTCA (percutaneous transluminal coronary angioplasty) 07/05/2012  . Essential hypertension 07/05/2012  . Hyperlipidemia 07/05/2012    Past Surgical History:  Procedure Laterality Date  . ABDOMINAL HYSTERECTOMY     partial  . CORONARY ANGIOPLASTY WITH STENT PLACEMENT  07/04/2012   mid LAD  . LEFT AND RIGHT HEART CATHETERIZATION WITH CORONARY ANGIOGRAM N/A 07/04/2012   Procedure: LEFT AND RIGHT HEART CATHETERIZATION WITH  CORONARY ANGIOGRAM;  Surgeon: Pamella Pert, MD;  Location: [facility] CATH LAB;  Service: Cardiovascular;  Laterality: N/A;  . LEFT HEART CATHETERIZATION WITH CORONARY ANGIOGRAM N/A 10/17/2012   Procedure: LEFT HEART CATHETERIZATION WITH CORONARY ANGIOGRAM;  Surgeon: Pamella Pert, MD;  Location: [facility] CATH LAB;  Service: Cardiovascular;  Laterality: N/A;  . PERCUTANEOUS CORONARY STENT INTERVENTION (PCI-S)  07/04/2012   Procedure: PERCUTANEOUS CORONARY STENT INTERVENTION (PCI-S);  Surgeon: Pamella Pert, MD;  Location: [facility] CATH LAB;  Service: Cardiovascular;;  . TUBAL LIGATION      OB History    No data available       Home Medications    Prior to Admission medications   Medication Sig Start Date End Date Taking? Authorizing Provider  albuterol (PROVENTIL HFA;VENTOLIN HFA) 108 (90 Base) MCG/ACT inhaler Inhale 2 puffs into the lungs 2 (two) times daily as needed for wheezing or shortness of breath. 04/17/17   Garlene Apperson, Mayer Masker, MD  amLODipine-valsartan (EXFORGE) 10-320 MG per tablet Take 1 tablet by mouth every morning.    [provider]  amoxicillin (AMOXIL) 500 MG capsule Take 1 capsule (500 mg total) by mouth 3 (three) times daily. 03/17/17   Rolland Porter, MD  aspirin EC 81 MG tablet Take 81 mg by mouth daily.    [provider]  atorvastatin (LIPITOR) 80 MG tablet Take 80 mg by mouth daily.    [provider]  b complex vitamins tablet Take 1 tablet by mouth daily.  [provider]  benzonatate (TESSALON) 100 MG capsule Take 1 capsule (100 mg total) by mouth every 8 (eight) hours. 03/17/17   Rolland Porter, MD  budesonide-formoterol [facility]) 160-4.5 MCG/ACT inhaler Inhale 2 puffs into the lungs daily.    [provider]  Calcium Carbonate-Vitamin D (CALCIUM + D PO) Take 1 tablet by mouth daily.    [provider]  clobetasol cream (TEMOVATE) 0.05 % Apply 1 application topically daily. Apply to rash.    [provider]    Cyanocobalamin (VITAMIN B-12 PO) Take 1 tablet by mouth daily.     [provider]  EPINEPHrine (EPI-PEN) 0.3 mg/0.3 mL SOAJ Inject 0.3 mg into the muscle once as needed (for allergic reaction to pollen).    [provider]  gabapentin (NEURONTIN) 300 MG capsule Take 300 mg by mouth at bedtime.    [provider]  hydrALAZINE (APRESOLINE) 50 MG tablet Take 50 mg by mouth 2 (two) times daily.     [provider]  hydrochlorothiazide (HYDRODIURIL) 25 MG tablet Take 1 tablet (25 mg total) by mouth daily. 12/04/15   Rodolph Bong, MD  HYDROcodone-acetaminophen (NORCO/VICODIN) 5-325 MG per tablet Take 1 tablet by mouth every 6 (six) hours as needed for pain.    [provider]  HYDROcodone-acetaminophen (NORCO/VICODIN) 5-325 MG tablet Take 1-2 tablets by mouth every 6 (six) hours as needed for moderate pain. 05/14/16   Cathren Laine, MD  hydrOXYzine (VISTARIL) 50 MG capsule Take 50 mg by mouth 4 (four) times daily as needed for itching.    [provider]  mirtazapine (REMERON) 15 MG tablet Take 15 mg by mouth at bedtime as needed (for sleep).    [provider]  nebivolol 20 MG TABS Take 1 tablet (20 mg total) by mouth daily. 12/05/15   Rodolph Bong, MD  nitroGLYCERIN (NITROSTAT) 0.4 MG SL tablet Place 1 tablet (0.4 mg total) under the tongue every 5 (five) minutes as needed for chest pain. 12/04/15   Rodolph Bong, MD  ondansetron (ZOFRAN-ODT) 4 MG disintegrating tablet Take 1 tablet (4 mg total) by mouth every 8 (eight) hours as needed for nausea or vomiting. 06/30/16   Benjiman Core, MD  pantoprazole (PROTONIX) 40 MG tablet Take 40 mg by mouth daily.     [provider]  polyethylene glycol (MIRALAX) packet Take 17 g by mouth daily. 06/30/16   Benjiman Core, MD  Polyvinyl Alcohol-Povidone (REFRESH OP) Place 1 drop into both eyes daily as needed (for dry eyes).    [provider]  predniSONE (DELTASONE)  20 MG tablet 3 po once a day for 1 days, then 2 po once a day for 2 days, then 1 po once a day for 2 days 05/15/16   Cathren Laine, MD  promethazine (PHENERGAN) 25 MG tablet Take 25 mg by mouth every 6 (six) hours as needed for nausea.    [provider]  tiotropium (SPIRIVA) 18 MCG inhalation capsule Place 18 mcg into inhaler and inhale daily.    [provider]  traMADol (ULTRAM) 50 MG tablet Take 1 tablet (50 mg total) by mouth every 6 (six) hours as needed. Take 1 tablet 3 times daily x 5 days, then every 6 hours as needed. 12/04/15   Rodolph Bong, MD    Family History Family History  Problem Relation Age of Onset  . Hyperlipidemia Sister   . Heart attack Sister   . Diabetes Daughter     Social History  Social History   Tobacco Use  . Smoking status: Never Smoker  . Smokeless tobacco: Never Used  Substance Use Topics  . Alcohol use: No  . Drug use: No     Allergies   Ciprofloxacin and Erythromycin   Review of Systems Review of Systems  Constitutional: Negative for fever.  HENT: Positive for congestion and trouble swallowing. Negative for postnasal drip.   Respiratory: Positive for cough and shortness of breath.   Gastrointestinal: Negative for abdominal pain, nausea and vomiting.  Genitourinary: Negative for dysuria.  All other systems reviewed and are negative.    Physical Exam Updated Vital Signs BP (!) 168/91   Pulse 71   Temp 98.5 F (36.9 C) (Oral)   Resp 15   Ht 5\' 8"  (1.727 m)   Wt 122.5 kg (270 lb)   SpO2 100% Comment: Simultaneous filing. User may not have seen previous data.  BMI 41.05 kg/m   Physical Exam  Constitutional: She is oriented to person, place, and time. No distress.  Obese, no acute distress  HENT:  Head: Normocephalic and atraumatic.  Mouth/Throat: Oropharynx is clear and moist.  No tonsillar exudate noted, uvula midline, mild erythema the posterior oropharynx  Eyes: Pupils are equal, round, and reactive to  light.  Neck: Neck supple.  Cardiovascular: Normal rate, regular rhythm and normal heart sounds.  No murmur heard. Pulmonary/Chest: Effort normal. No respiratory distress. She has wheezes. She exhibits tenderness.  Scant wheeze  Abdominal: Soft. Bowel sounds are normal. There is no tenderness.  Lymphadenopathy:    She has cervical adenopathy.  Neurological: She is alert and oriented to person, place, and time.  Skin: Skin is warm and dry.  Psychiatric: She has a normal mood and affect.  Nursing note and vitals reviewed.    ED Treatments / Results  Labs (all labs ordered are listed, but only abnormal results are displayed) Labs Reviewed  CBG MONITORING, ED    EKG  EKG Interpretation  Date/Time:  Saturday April 16 2017 23:38:58 EST Ventricular Rate:  71 PR Interval:    QRS Duration: 94 QT Interval:  401 QTC Calculation: 436 R Axis:   30 Text Interpretation:  Sinus rhythm Ventricular premature complex Prolonged PR interval No significant change since last tracing Confirmed by Ross Marcus ([id number]) on 04/17/2017 12:06:27 AM       Radiology Dg Chest 2 View  Result Date: 04/16/2017 CLINICAL DATA:  Chest discomfort, nausea, headache, cough, and congestion for 2 days. EXAM: CHEST  2 VIEW COMPARISON:  03/17/2017 FINDINGS: Normal heart size and pulmonary vascularity. No focal airspace disease or consolidation in the lungs. No blunting of costophrenic angles. No pneumothorax. Mediastinal contours appear intact. Degenerative changes in the spine. Surgical clips in the upper abdomen. IMPRESSION: No active cardiopulmonary disease. Electronically Signed   By: Burman Nieves M.D.   On: 04/16/2017 23:45    Procedures Procedures (including critical care time)  Medications Ordered in ED Medications  dexamethasone (DECADRON) injection 10 mg (not administered)  albuterol (PROVENTIL HFA;VENTOLIN HFA) 108 (90 Base) MCG/ACT inhaler 1 puff (1 puff Inhalation Given 04/16/17 2339)      Initial Impression / Assessment and Plan / ED Course  I have reviewed the triage vital signs and the nursing notes.  Pertinent labs & imaging results that were available during my care of the patient were reviewed by me and considered in my medical decision making (see chart for details).     Patient presents with upper respiratory symptoms she is overall nontoxic  appearing and vital signs are reassuring.  She is afebrile.  Blood pressure 168/91.  Exam is fairly reassuring.  She has any very occasional wheeze and tenderness that is reproducible on exam of the chest.  Suspect viral etiology.  Screening EKG was obtained and chest x-ray was obtained to rule out pneumonia.  Given her history of asthma, she was given an inhaler and Decadron.  Recommend supportive measures at home.  After history, exam, and medical workup I feel the patient has been appropriately medically screened and is safe for discharge home. Pertinent diagnoses were discussed with the patient. Patient was given return precautions.   Final Clinical Impressions(s) / ED Diagnoses   Final diagnoses:  Viral URI with cough    ED Discharge Orders        Ordered    albuterol (PROVENTIL HFA;VENTOLIN HFA) 108 (90 Base) MCG/ACT inhaler  2 times daily PRN     04/17/17 0021       Shon BatonHorton, Jilliana Burkes F, MD 04/17/17 0025

## 2017-04-21 DIAGNOSIS — Z0000 Encounter for general adult medical examination without abnormal findings: Secondary | ICD-10-CM | POA: Diagnosis not present

## 2017-04-21 DIAGNOSIS — E1151 Type 2 diabetes mellitus with diabetic peripheral angiopathy without gangrene: Secondary | ICD-10-CM | POA: Diagnosis not present

## 2017-04-21 DIAGNOSIS — E1140 Type 2 diabetes mellitus with diabetic neuropathy, unspecified: Secondary | ICD-10-CM | POA: Diagnosis not present

## 2017-04-21 DIAGNOSIS — I10 Essential (primary) hypertension: Secondary | ICD-10-CM | POA: Diagnosis not present

## 2017-04-21 DIAGNOSIS — E785 Hyperlipidemia, unspecified: Secondary | ICD-10-CM | POA: Diagnosis not present

## 2017-05-10 DIAGNOSIS — R0602 Shortness of breath: Secondary | ICD-10-CM | POA: Diagnosis not present

## 2017-05-10 DIAGNOSIS — E789 Disorder of lipoprotein metabolism, unspecified: Secondary | ICD-10-CM | POA: Diagnosis not present

## 2017-05-10 DIAGNOSIS — I10 Essential (primary) hypertension: Secondary | ICD-10-CM | POA: Diagnosis not present

## 2017-05-11 DIAGNOSIS — I509 Heart failure, unspecified: Secondary | ICD-10-CM | POA: Diagnosis not present

## 2017-05-11 DIAGNOSIS — I10 Essential (primary) hypertension: Secondary | ICD-10-CM | POA: Diagnosis not present

## 2017-05-15 DIAGNOSIS — J45909 Unspecified asthma, uncomplicated: Secondary | ICD-10-CM | POA: Diagnosis not present

## 2017-05-19 DIAGNOSIS — I10 Essential (primary) hypertension: Secondary | ICD-10-CM | POA: Diagnosis not present

## 2017-05-19 DIAGNOSIS — E1151 Type 2 diabetes mellitus with diabetic peripheral angiopathy without gangrene: Secondary | ICD-10-CM | POA: Diagnosis not present

## 2017-05-19 DIAGNOSIS — K219 Gastro-esophageal reflux disease without esophagitis: Secondary | ICD-10-CM | POA: Diagnosis not present

## 2017-05-25 DIAGNOSIS — R0789 Other chest pain: Secondary | ICD-10-CM | POA: Diagnosis not present

## 2017-05-25 DIAGNOSIS — Z9861 Coronary angioplasty status: Secondary | ICD-10-CM | POA: Diagnosis not present

## 2017-05-25 DIAGNOSIS — R0609 Other forms of dyspnea: Secondary | ICD-10-CM | POA: Diagnosis not present

## 2017-05-25 DIAGNOSIS — I2510 Atherosclerotic heart disease of native coronary artery without angina pectoris: Secondary | ICD-10-CM | POA: Diagnosis not present

## 2017-06-06 DIAGNOSIS — E119 Type 2 diabetes mellitus without complications: Secondary | ICD-10-CM | POA: Diagnosis not present

## 2017-06-06 DIAGNOSIS — R0789 Other chest pain: Secondary | ICD-10-CM | POA: Diagnosis not present

## 2017-06-06 DIAGNOSIS — I10 Essential (primary) hypertension: Secondary | ICD-10-CM | POA: Diagnosis not present

## 2017-06-06 DIAGNOSIS — R0602 Shortness of breath: Secondary | ICD-10-CM | POA: Diagnosis not present

## 2017-06-08 DIAGNOSIS — J449 Chronic obstructive pulmonary disease, unspecified: Secondary | ICD-10-CM | POA: Diagnosis not present

## 2017-06-08 DIAGNOSIS — I10 Essential (primary) hypertension: Secondary | ICD-10-CM | POA: Diagnosis not present

## 2017-06-15 DIAGNOSIS — J45909 Unspecified asthma, uncomplicated: Secondary | ICD-10-CM | POA: Diagnosis not present

## 2017-06-15 DIAGNOSIS — R0602 Shortness of breath: Secondary | ICD-10-CM | POA: Diagnosis not present

## 2017-06-15 DIAGNOSIS — I739 Peripheral vascular disease, unspecified: Secondary | ICD-10-CM | POA: Diagnosis not present

## 2017-06-15 DIAGNOSIS — R0789 Other chest pain: Secondary | ICD-10-CM | POA: Diagnosis not present

## 2017-06-15 DIAGNOSIS — I10 Essential (primary) hypertension: Secondary | ICD-10-CM | POA: Diagnosis not present

## 2017-06-21 ENCOUNTER — Ambulatory Visit: Payer: Medicare Other

## 2017-06-28 ENCOUNTER — Ambulatory Visit: Payer: Medicare Other

## 2017-06-29 ENCOUNTER — Ambulatory Visit: Payer: Medicare Other | Admitting: *Deleted

## 2017-07-05 ENCOUNTER — Ambulatory Visit: Payer: Medicare Other

## 2017-07-05 DIAGNOSIS — I2510 Atherosclerotic heart disease of native coronary artery without angina pectoris: Secondary | ICD-10-CM | POA: Diagnosis not present

## 2017-07-05 DIAGNOSIS — R0789 Other chest pain: Secondary | ICD-10-CM | POA: Diagnosis not present

## 2017-07-05 DIAGNOSIS — Z9861 Coronary angioplasty status: Secondary | ICD-10-CM | POA: Diagnosis not present

## 2017-07-05 DIAGNOSIS — R0609 Other forms of dyspnea: Secondary | ICD-10-CM | POA: Diagnosis not present

## 2017-07-06 DIAGNOSIS — I119 Hypertensive heart disease without heart failure: Secondary | ICD-10-CM | POA: Diagnosis not present

## 2017-07-06 DIAGNOSIS — E1140 Type 2 diabetes mellitus with diabetic neuropathy, unspecified: Secondary | ICD-10-CM | POA: Diagnosis not present

## 2017-07-12 ENCOUNTER — Encounter: Payer: Medicare Other | Attending: Internal Medicine | Admitting: Dietician

## 2017-07-12 DIAGNOSIS — I2510 Atherosclerotic heart disease of native coronary artery without angina pectoris: Secondary | ICD-10-CM | POA: Insufficient documentation

## 2017-07-12 DIAGNOSIS — E119 Type 2 diabetes mellitus without complications: Secondary | ICD-10-CM | POA: Diagnosis not present

## 2017-07-12 DIAGNOSIS — E7800 Pure hypercholesterolemia, unspecified: Secondary | ICD-10-CM | POA: Insufficient documentation

## 2017-07-12 DIAGNOSIS — Z713 Dietary counseling and surveillance: Secondary | ICD-10-CM | POA: Diagnosis not present

## 2017-07-12 DIAGNOSIS — E1159 Type 2 diabetes mellitus with other circulatory complications: Secondary | ICD-10-CM

## 2017-07-13 ENCOUNTER — Encounter: Payer: Self-pay | Admitting: Dietician

## 2017-07-13 NOTE — Progress Notes (Signed)
Patient was seen on 07/02/17 for the first of a series of three diabetes self-management courses at [facility].  Patient Education Plan per assessed needs and concerns is to attend three course education program for Diabetes Self Management Education.  The following learning objectives were met by the patient during this class:  Describe diabetes  State some common risk factors for diabetes  Defines the role of glucose and insulin  Identifies type of diabetes and pathophysiology  Describe the relationship between diabetes and cardiovascular risk  State the members of the Healthcare Team  States the rationale for glucose monitoring  State when to test glucose  State their individual Target Range  State the importance of logging glucose readings  Describe how to interpret glucose readings  Identifies A1C target  Explain the correlation between A1c and eAG values  State symptoms and treatment of high blood glucose  State symptoms and treatment of low blood glucose  Explain proper technique for glucose testing  Identifies proper sharps disposal  Handouts given during class include:  ADA Diabetes You Take Control   Carb Counting and Meal Planning book  Meal Plan Card  Meal planning worksheet  Low Sodium Flavoring Tips  Types of Fats  The diabetes portion plate  A1c to eAG Conversion Chart  Diabetes Recommended Care Schedule  Support Group  Diabetes Success Plan  Core Class Satisfaction Survey   Follow-Up Plan:  Attend core 2   

## 2017-07-15 DIAGNOSIS — J45909 Unspecified asthma, uncomplicated: Secondary | ICD-10-CM | POA: Diagnosis not present

## 2017-07-19 ENCOUNTER — Encounter: Payer: Medicare Other | Attending: Internal Medicine | Admitting: Dietician

## 2017-07-19 DIAGNOSIS — E119 Type 2 diabetes mellitus without complications: Secondary | ICD-10-CM | POA: Diagnosis not present

## 2017-07-19 DIAGNOSIS — I2510 Atherosclerotic heart disease of native coronary artery without angina pectoris: Secondary | ICD-10-CM | POA: Diagnosis not present

## 2017-07-19 DIAGNOSIS — R079 Chest pain, unspecified: Secondary | ICD-10-CM | POA: Diagnosis not present

## 2017-07-19 DIAGNOSIS — Z713 Dietary counseling and surveillance: Secondary | ICD-10-CM | POA: Insufficient documentation

## 2017-07-19 DIAGNOSIS — E1142 Type 2 diabetes mellitus with diabetic polyneuropathy: Secondary | ICD-10-CM | POA: Diagnosis not present

## 2017-07-19 DIAGNOSIS — R0602 Shortness of breath: Secondary | ICD-10-CM | POA: Diagnosis not present

## 2017-07-19 DIAGNOSIS — R600 Localized edema: Secondary | ICD-10-CM | POA: Diagnosis not present

## 2017-07-19 DIAGNOSIS — E7800 Pure hypercholesterolemia, unspecified: Secondary | ICD-10-CM | POA: Insufficient documentation

## 2017-07-19 DIAGNOSIS — E1169 Type 2 diabetes mellitus with other specified complication: Secondary | ICD-10-CM | POA: Diagnosis not present

## 2017-07-19 DIAGNOSIS — I493 Ventricular premature depolarization: Secondary | ICD-10-CM | POA: Diagnosis not present

## 2017-07-19 DIAGNOSIS — S90821A Blister (nonthermal), right foot, initial encounter: Secondary | ICD-10-CM | POA: Diagnosis not present

## 2017-07-19 DIAGNOSIS — I440 Atrioventricular block, first degree: Secondary | ICD-10-CM | POA: Diagnosis not present

## 2017-07-19 DIAGNOSIS — R072 Precordial pain: Secondary | ICD-10-CM | POA: Diagnosis not present

## 2017-07-19 DIAGNOSIS — L97509 Non-pressure chronic ulcer of other part of unspecified foot with unspecified severity: Secondary | ICD-10-CM | POA: Diagnosis not present

## 2017-07-19 DIAGNOSIS — R05 Cough: Secondary | ICD-10-CM | POA: Diagnosis not present

## 2017-07-19 DIAGNOSIS — I209 Angina pectoris, unspecified: Secondary | ICD-10-CM | POA: Diagnosis not present

## 2017-07-19 DIAGNOSIS — E1159 Type 2 diabetes mellitus with other circulatory complications: Secondary | ICD-10-CM

## 2017-07-19 NOTE — Progress Notes (Signed)
Patient was seen on 07/19/17 for the second of a series of three diabetes self-management courses at the [facility]. The following learning objectives were met by the patient during this class:   Describe the role of different macronutrients on glucose  Explain how carbohydrates affect blood glucose  State what foods contain the most carbohydrates  Demonstrate carbohydrate counting  Demonstrate how to read Nutrition Facts food label  Describe effects of various fats on heart health  Describe the importance of good nutrition for health and healthy eating strategies  Describe techniques for managing your shopping, cooking and meal planning  List strategies to follow meal plan when dining out  Describe the effects of alcohol on glucose and how to use it safely  Goals:  Follow Diabetes Meal Plan as instructed  Aim to spread carbs evenly throughout the day  Aim for 3 meals per day and snacks as needed Include lean protein foods to meals/snacks  Monitor glucose levels as instructed by your doctor   Follow-Up Plan:  Attend Core 3  Work towards following your personal food plan.   

## 2017-07-20 DIAGNOSIS — I493 Ventricular premature depolarization: Secondary | ICD-10-CM | POA: Diagnosis not present

## 2017-07-20 DIAGNOSIS — I440 Atrioventricular block, first degree: Secondary | ICD-10-CM | POA: Diagnosis not present

## 2017-07-26 ENCOUNTER — Institutional Professional Consult (permissible substitution): Payer: Self-pay | Admitting: Neurology

## 2017-07-26 ENCOUNTER — Encounter: Payer: Medicare Other | Admitting: Dietician

## 2017-07-26 DIAGNOSIS — E1159 Type 2 diabetes mellitus with other circulatory complications: Secondary | ICD-10-CM

## 2017-07-26 DIAGNOSIS — E7800 Pure hypercholesterolemia, unspecified: Secondary | ICD-10-CM | POA: Diagnosis not present

## 2017-07-26 DIAGNOSIS — E119 Type 2 diabetes mellitus without complications: Secondary | ICD-10-CM | POA: Diagnosis not present

## 2017-07-26 DIAGNOSIS — I2510 Atherosclerotic heart disease of native coronary artery without angina pectoris: Secondary | ICD-10-CM | POA: Diagnosis not present

## 2017-07-26 DIAGNOSIS — Z713 Dietary counseling and surveillance: Secondary | ICD-10-CM | POA: Diagnosis not present

## 2017-07-28 ENCOUNTER — Encounter: Payer: Self-pay | Admitting: Dietician

## 2017-07-28 NOTE — Progress Notes (Signed)
Patient was seen on 07/26/17 for the third of a series of three diabetes self-management courses at the [facility].   Angie Hill the amount of activity recommended for healthy living . Describe activities suitable for individual needs . Identify ways to regularly incorporate activity into daily life . Identify barriers to activity and ways to over come these barriers  Identify diabetes medications being personally used and their primary action for lowering glucose and possible side effects . Describe role of stress on blood glucose and develop strategies to address psychosocial issues . Identify diabetes complications and ways to prevent them  Explain how to manage diabetes during illness . Evaluate success in meeting personal goal . Establish 2-3 goals that they will plan to diligently work on  Goals:   I will count my carb choices at most meals and snacks  I will be active 30 minutes or more 5 times a week  I will take my diabetes medications as scheduled  I will eat less unhealthy fats.  I will test my glucose at least 3-4 times a day, 7 days a week  I will look at patterns in my record book at least 30 days a month  To help manage stress I will  walk at least 5 times a week  Your patient has identified these potential barriers to change:  Motivation Stress  Your patient has identified their diabetes self-care support plan as  Family Support Magazine Subscriptions  [facility] Support Group     Plan:  Attend Support Group as desired

## 2017-08-03 DIAGNOSIS — I10 Essential (primary) hypertension: Secondary | ICD-10-CM | POA: Diagnosis not present

## 2017-08-03 DIAGNOSIS — J309 Allergic rhinitis, unspecified: Secondary | ICD-10-CM | POA: Diagnosis not present

## 2017-08-04 DIAGNOSIS — H40013 Open angle with borderline findings, low risk, bilateral: Secondary | ICD-10-CM | POA: Diagnosis not present

## 2017-08-04 DIAGNOSIS — Z7984 Long term (current) use of oral hypoglycemic drugs: Secondary | ICD-10-CM | POA: Diagnosis not present

## 2017-08-04 DIAGNOSIS — E113551 Type 2 diabetes mellitus with stable proliferative diabetic retinopathy, right eye: Secondary | ICD-10-CM | POA: Diagnosis not present

## 2017-08-04 DIAGNOSIS — H5371 Glare sensitivity: Secondary | ICD-10-CM | POA: Diagnosis not present

## 2017-08-04 DIAGNOSIS — H2513 Age-related nuclear cataract, bilateral: Secondary | ICD-10-CM | POA: Diagnosis not present

## 2017-08-05 DIAGNOSIS — Z9861 Coronary angioplasty status: Secondary | ICD-10-CM | POA: Diagnosis not present

## 2017-08-05 DIAGNOSIS — R0609 Other forms of dyspnea: Secondary | ICD-10-CM | POA: Diagnosis not present

## 2017-08-05 DIAGNOSIS — I2510 Atherosclerotic heart disease of native coronary artery without angina pectoris: Secondary | ICD-10-CM | POA: Diagnosis not present

## 2017-08-05 DIAGNOSIS — I5033 Acute on chronic diastolic (congestive) heart failure: Secondary | ICD-10-CM | POA: Diagnosis not present

## 2017-08-15 DIAGNOSIS — J45909 Unspecified asthma, uncomplicated: Secondary | ICD-10-CM | POA: Diagnosis not present

## 2017-08-18 DIAGNOSIS — J302 Other seasonal allergic rhinitis: Secondary | ICD-10-CM | POA: Diagnosis not present

## 2017-08-18 DIAGNOSIS — E1151 Type 2 diabetes mellitus with diabetic peripheral angiopathy without gangrene: Secondary | ICD-10-CM | POA: Diagnosis not present

## 2017-08-18 DIAGNOSIS — I10 Essential (primary) hypertension: Secondary | ICD-10-CM | POA: Diagnosis not present

## 2017-08-18 DIAGNOSIS — E785 Hyperlipidemia, unspecified: Secondary | ICD-10-CM | POA: Diagnosis not present

## 2017-08-18 DIAGNOSIS — E1140 Type 2 diabetes mellitus with diabetic neuropathy, unspecified: Secondary | ICD-10-CM | POA: Diagnosis not present

## 2017-08-22 DIAGNOSIS — Z0110 Encounter for examination of ears and hearing without abnormal findings: Secondary | ICD-10-CM | POA: Diagnosis not present

## 2017-08-22 DIAGNOSIS — E1140 Type 2 diabetes mellitus with diabetic neuropathy, unspecified: Secondary | ICD-10-CM | POA: Diagnosis not present

## 2017-08-22 DIAGNOSIS — Z0000 Encounter for general adult medical examination without abnormal findings: Secondary | ICD-10-CM | POA: Diagnosis not present

## 2017-08-22 DIAGNOSIS — E1151 Type 2 diabetes mellitus with diabetic peripheral angiopathy without gangrene: Secondary | ICD-10-CM | POA: Diagnosis not present

## 2017-08-22 DIAGNOSIS — I10 Essential (primary) hypertension: Secondary | ICD-10-CM | POA: Diagnosis not present

## 2017-08-22 DIAGNOSIS — Z0100 Encounter for examination of eyes and vision without abnormal findings: Secondary | ICD-10-CM | POA: Diagnosis not present

## 2017-08-23 DIAGNOSIS — E1151 Type 2 diabetes mellitus with diabetic peripheral angiopathy without gangrene: Secondary | ICD-10-CM | POA: Diagnosis not present

## 2017-08-23 DIAGNOSIS — R0602 Shortness of breath: Secondary | ICD-10-CM | POA: Diagnosis not present

## 2017-08-23 DIAGNOSIS — R05 Cough: Secondary | ICD-10-CM | POA: Diagnosis not present

## 2017-08-25 DIAGNOSIS — R0609 Other forms of dyspnea: Secondary | ICD-10-CM | POA: Diagnosis not present

## 2017-08-25 DIAGNOSIS — I5033 Acute on chronic diastolic (congestive) heart failure: Secondary | ICD-10-CM | POA: Diagnosis not present

## 2017-08-25 DIAGNOSIS — I2510 Atherosclerotic heart disease of native coronary artery without angina pectoris: Secondary | ICD-10-CM | POA: Diagnosis not present

## 2017-08-25 DIAGNOSIS — Z9861 Coronary angioplasty status: Secondary | ICD-10-CM | POA: Diagnosis not present

## 2017-08-29 ENCOUNTER — Institutional Professional Consult (permissible substitution): Payer: Self-pay | Admitting: Neurology

## 2017-08-29 ENCOUNTER — Encounter: Payer: Self-pay | Admitting: Neurology

## 2017-08-29 ENCOUNTER — Telehealth: Payer: Self-pay | Admitting: Neurology

## 2017-08-29 NOTE — Telephone Encounter (Signed)
Pt was a no show to apt today.  

## 2017-09-01 ENCOUNTER — Other Ambulatory Visit: Payer: Self-pay

## 2017-09-01 NOTE — Patient Outreach (Signed)
[facility] Care Management  09/01/2017  Rogene HoustonVersie M Hill 13-Aug-1940 [id number]   Medication Adherence call to Mrs. Delories HeinzVersie Hanser left a message for patient to call back patient is due on Atorvastatin 80 mg. [name] is showing past due under [employer].  Lillia AbedAna Ollison-Moran CPhT Pharmacy Technician [facility] Care Management Direct Dial [phone]  Fax [phone] [email or web address]

## 2017-09-14 DIAGNOSIS — J45909 Unspecified asthma, uncomplicated: Secondary | ICD-10-CM | POA: Diagnosis not present

## 2017-09-17 ENCOUNTER — Emergency Department (HOSPITAL_BASED_OUTPATIENT_CLINIC_OR_DEPARTMENT_OTHER): Payer: Medicare Other

## 2017-09-17 ENCOUNTER — Encounter (HOSPITAL_BASED_OUTPATIENT_CLINIC_OR_DEPARTMENT_OTHER): Payer: Self-pay | Admitting: Emergency Medicine

## 2017-09-17 ENCOUNTER — Other Ambulatory Visit: Payer: Self-pay

## 2017-09-17 ENCOUNTER — Emergency Department (HOSPITAL_BASED_OUTPATIENT_CLINIC_OR_DEPARTMENT_OTHER)
Admission: EM | Admit: 2017-09-17 | Discharge: 2017-09-17 | Disposition: A | Discharge status: Home / Self Care | Payer: Medicare Other | Attending: Emergency Medicine | Admitting: Emergency Medicine

## 2017-09-17 DIAGNOSIS — E119 Type 2 diabetes mellitus without complications: Secondary | ICD-10-CM | POA: Insufficient documentation

## 2017-09-17 DIAGNOSIS — R05 Cough: Secondary | ICD-10-CM | POA: Diagnosis not present

## 2017-09-17 DIAGNOSIS — Z7982 Long term (current) use of aspirin: Secondary | ICD-10-CM | POA: Diagnosis not present

## 2017-09-17 DIAGNOSIS — R0602 Shortness of breath: Secondary | ICD-10-CM | POA: Diagnosis not present

## 2017-09-17 DIAGNOSIS — R0789 Other chest pain: Secondary | ICD-10-CM | POA: Diagnosis not present

## 2017-09-17 DIAGNOSIS — Z7984 Long term (current) use of oral hypoglycemic drugs: Secondary | ICD-10-CM | POA: Insufficient documentation

## 2017-09-17 DIAGNOSIS — R059 Cough, unspecified: Secondary | ICD-10-CM

## 2017-09-17 DIAGNOSIS — I2510 Atherosclerotic heart disease of native coronary artery without angina pectoris: Secondary | ICD-10-CM | POA: Insufficient documentation

## 2017-09-17 DIAGNOSIS — Z79899 Other long term (current) drug therapy: Secondary | ICD-10-CM | POA: Diagnosis not present

## 2017-09-17 DIAGNOSIS — I10 Essential (primary) hypertension: Secondary | ICD-10-CM | POA: Insufficient documentation

## 2017-09-17 DIAGNOSIS — J449 Chronic obstructive pulmonary disease, unspecified: Secondary | ICD-10-CM | POA: Insufficient documentation

## 2017-09-17 DIAGNOSIS — Z8709 Personal history of other diseases of the respiratory system: Secondary | ICD-10-CM

## 2017-09-17 LAB — CBC WITH DIFFERENTIAL/PLATELET
BASOS ABS: 0 10*3/uL (ref 0.0–0.1)
Basophils Relative: 0 %
EOS PCT: 1 %
Eosinophils Absolute: 0.2 10*3/uL (ref 0.0–0.7)
HEMATOCRIT: 38.4 % (ref 36.0–46.0)
Hemoglobin: 12.5 g/dL (ref 12.0–15.0)
LYMPHS PCT: 51 %
Lymphs Abs: 5.3 10*3/uL — ABNORMAL HIGH (ref 0.7–4.0)
MCH: 27.1 pg (ref 26.0–34.0)
MCHC: 32.6 g/dL (ref 30.0–36.0)
MCV: 83.1 fL (ref 78.0–100.0)
MONO ABS: 0.5 10*3/uL (ref 0.1–1.0)
Monocytes Relative: 4 %
NEUTROS ABS: 4.6 10*3/uL (ref 1.7–7.7)
Neutrophils Relative %: 44 %
Platelets: 277 10*3/uL (ref 150–400)
RBC: 4.62 MIL/uL (ref 3.87–5.11)
RDW: 13.8 % (ref 11.5–15.5)
WBC: 10.5 10*3/uL (ref 4.0–10.5)

## 2017-09-17 LAB — BASIC METABOLIC PANEL
Anion gap: 9 (ref 5–15)
BUN: 14 mg/dL (ref 8–23)
CHLORIDE: 102 mmol/L (ref 98–111)
CO2: 32 mmol/L (ref 22–32)
Calcium: 9.1 mg/dL (ref 8.9–10.3)
Creatinine, Ser: 0.81 mg/dL (ref 0.44–1.00)
GFR calc Af Amer: >60 mL/min (ref >60)
GLUCOSE: 119 mg/dL — AB (ref 70–99)
POTASSIUM: 3.4 mmol/L — AB (ref 3.5–5.1)
Sodium: 143 mmol/L (ref 135–145)

## 2017-09-17 LAB — TROPONIN I: Troponin I: <0.03 ng/mL (ref <0.03)

## 2017-09-17 LAB — BRAIN NATRIURETIC PEPTIDE: B NATRIURETIC PEPTIDE 5: 46.7 pg/mL (ref 0.0–100.0)

## 2017-09-17 MED ORDER — ALBUTEROL SULFATE (2.5 MG/3ML) 0.083% IN NEBU
2.5000 mg | INHALATION_SOLUTION | Freq: Once | RESPIRATORY_TRACT | Status: AC
  Administered 2017-09-17: 2.5 mg via RESPIRATORY_TRACT
  Filled 2017-09-17: qty 3

## 2017-09-17 MED ORDER — ALBUTEROL (5 MG/ML) CONTINUOUS INHALATION SOLN
10.0000 mg/h | INHALATION_SOLUTION | RESPIRATORY_TRACT | Status: DC
  Administered 2017-09-17: 10 mg/h via RESPIRATORY_TRACT
  Filled 2017-09-17: qty 20

## 2017-09-17 MED ORDER — DOXYCYCLINE HYCLATE 100 MG PO CAPS: 100.0000 mg | ORAL_CAPSULE | Freq: Two times a day (BID) | ORAL | 0 refills | Status: DC

## 2017-09-17 MED ORDER — IPRATROPIUM BROMIDE 0.02 % IN SOLN
0.5000 mg | Freq: Once | RESPIRATORY_TRACT | Status: AC
  Administered 2017-09-17: 0.5 mg via RESPIRATORY_TRACT
  Filled 2017-09-17: qty 2.5

## 2017-09-17 MED ORDER — IPRATROPIUM-ALBUTEROL 0.5-2.5 (3) MG/3ML IN SOLN
3.0000 mL | Freq: Once | RESPIRATORY_TRACT | Status: AC
  Administered 2017-09-17: 3 mL via RESPIRATORY_TRACT
  Filled 2017-09-17: qty 3

## 2017-09-17 MED ORDER — BENZONATATE 100 MG PO CAPS: 100.0000 mg | ORAL_CAPSULE | Freq: Three times a day (TID) | ORAL | 0 refills | Status: DC

## 2017-09-17 MED ORDER — PREDNISONE 10 MG PO TABS: 40.0000 mg | ORAL_TABLET | Freq: Every day | ORAL | 0 refills | Status: AC

## 2017-09-17 MED ORDER — METHYLPREDNISOLONE SODIUM SUCC 125 MG IJ SOLR
125.0000 mg | Freq: Once | INTRAMUSCULAR | Status: AC
  Administered 2017-09-17: 125 mg via INTRAVENOUS
  Filled 2017-09-17: qty 2

## 2017-09-17 MED ORDER — ALBUTEROL SULFATE HFA 108 (90 BASE) MCG/ACT IN AERS
1.0000 | INHALATION_SPRAY | Freq: Once | RESPIRATORY_TRACT | Status: AC
  Administered 2017-09-17: 2 via RESPIRATORY_TRACT
  Filled 2017-09-17: qty 6.7

## 2017-09-17 NOTE — ED Notes (Signed)
AMBULATING: SpO2 95-98%  HR 98-102

## 2017-09-17 NOTE — ED Triage Notes (Signed)
Pt c/o SOB and cough x several days and states she feels swollen.

## 2017-09-17 NOTE — Discharge Instructions (Addendum)
Follow attached handouts. Follow up with your PCP in the next 2 days for recheck.  Please take all of your antibiotics until finished!   You may develop abdominal discomfort or diarrhea from the antibiotic.  You may help offset this with probiotics which you can buy or get in yogurt. Do not eat or take the probiotics until 2 hours after your antibiotic. Do not take your medicine if develop an itchy rash, swelling in your mouth or lips, or difficulty breathing.  Please take steroids as prescribed (prednisone).  Please note if you are diabetic this medication can increase your blood sugar and you should monitor them at home.  Please use Tessalon as needed for cough.  Albuterol inhaler - this medication will help open up your airway. Use inhaler as follows: 1-2 puffs with spacer every 4 hours as needed for wheezing, cough, or shortness of breath.  Return for fever, chest pain especially with exertion, worsening shortness of breath, lower leg swelling or if you cough up blood. If you develop worsening or new concerning symptoms you can return to the emergency department for re-evaluation.

## 2017-09-17 NOTE — ED Provider Notes (Signed)
[facility] EMERGENCY DEPARTMENT Provider Note   CSN: [id number] Arrival date & time: 09/17/17  1355     History   Chief Complaint Chief Complaint  Patient presents with  . Shortness of Breath    HPI Angie Hill is a 77 y.o. female with a history of CAD (followed by Dr. Jacinto Halim), COPD, asthma, hypertension who presents the emergency department today for shortness of breath.  Patient notes the last 3-4 days she has been having increasing shortness of breath that occurs with exertion.  She notes she normally is able to walk 10-15 steps without becoming short of breath but now it occurs between 5-10 steps.  She notes she has a new cough productive cough with yellow/green sputum.  She has been trying her home Symbicort as well as 4 rounds of MDI albuterol for symptoms without any relief.  She feels like this is a typical COPD exacerbation for her.  She notes that she does have some chest tightness when coughing but denies pain.  She denies any neck pain, jaw pain, back pain, arm pain, nausea, emesis, or diaphoresis.  She knows ambulating does not bring on any chest pain.  She denies history of heart failure, orthopnea or lower leg swelling.  Patient notes that nothing makes her symptoms better.  She reports her last admission for COPD was one year ago.  She denies prior intubations for COPD.  The patient denies any fever, headache, focal weakness, abdominal pain, nausea/vomiting/diarrhea, recent surgery, recent travel, increase immobilization or hemoptysis.  Patient does not wear home oxygen.    HPI  Past Medical History:  Diagnosis Date  . Anemia   . Angina pectoris (HCC)   . Arthritis   . Asthma   . COPD (chronic obstructive pulmonary disease) (HCC)   . Coronary artery disease   . Depression   . Diabetes mellitus without complication (HCC)    BORDERLINE  . GERD (gastroesophageal reflux disease)   . H/O hiatal hernia   . Hypertension   . Shortness of breath   . Sleep apnea      Patient Active Problem List   Diagnosis Date Noted  . Epigastric pain   . Chest pain, musculoskeletal 12/03/2015  . 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 Onychomycosis 09/01/2012  . Pain in joint, ankle and foot 09/01/2012  . Dermatitis 09/01/2012  . Hereditary and idiopathic peripheral neuropathy 09/01/2012  . S/P PTCA (percutaneous transluminal coronary angioplasty) 07/05/2012  . Essential hypertension 07/05/2012  . Hyperlipidemia 07/05/2012    Past Surgical History:  Procedure Laterality Date  . ABDOMINAL HYSTERECTOMY     partial  . CORONARY ANGIOPLASTY WITH STENT PLACEMENT  07/04/2012   mid LAD  . LEFT AND RIGHT HEART CATHETERIZATION WITH CORONARY ANGIOGRAM N/A 07/04/2012   Procedure: LEFT AND RIGHT HEART CATHETERIZATION WITH CORONARY ANGIOGRAM;  Surgeon: Pamella Pert, MD;  Location: [facility] CATH LAB;  Service: Cardiovascular;  Laterality: N/A;  . LEFT HEART CATHETERIZATION WITH CORONARY ANGIOGRAM N/A 10/17/2012   Procedure: LEFT HEART CATHETERIZATION WITH CORONARY ANGIOGRAM;  Surgeon: Pamella Pert, MD;  Location: [facility] CATH LAB;  Service: Cardiovascular;  Laterality: N/A;  . PARTIAL COLECTOMY  1982  . PERCUTANEOUS CORONARY STENT INTERVENTION (PCI-S)  07/04/2012   Procedure: PERCUTANEOUS CORONARY STENT INTERVENTION (PCI-S);  Surgeon: Pamella Pert, MD;  Location: [facility] C[facility] LAB;  Service: Cardiovascular;;  . TUBAL LIGATION       OB History   None      Home  Medications    Prior to Admission medications   Medication Sig Start Date End Date Taking? Authorizing Provider  albuterol (PROVENTIL HFA;VENTOLIN HFA) 108 (90 Base) MCG/ACT inhaler Inhale 2 puffs into the lungs 2 (two) times daily as needed for wheezing or shortness of breath. 04/17/17   Horton, Mayer Masker, MD  allopurinol (ZYLOPRIM) 300 MG tablet Take 300 mg by mouth daily.    [provider]  amLODipine-valsartan (EXFORGE) 10-320 MG per tablet Take 1 tablet by mouth every  morning.    [provider]  amoxicillin (AMOXIL) 500 MG capsule Take 1 capsule (500 mg total) by mouth 3 (three) times daily. Patient not taking: Reported on 07/13/2017 03/17/17   Rolland Porter, MD  aspirin EC 81 MG tablet Take 81 mg by mouth daily.    [provider]  atorvastatin (LIPITOR) 80 MG tablet Take 80 mg by mouth daily.    [provider]  b complex vitamins tablet Take 1 tablet by mouth daily.    [provider]  benzonatate (TESSALON) 100 MG capsule Take 1 capsule (100 mg total) by mouth every 8 (eight) hours. 03/17/17   Rolland Porter, MD  budesonide-formoterol [facility]) 160-4.5 MCG/ACT inhaler Inhale 2 puffs into the lungs daily.    [provider]  Calcium Carbonate-Vitamin D (CALCIUM + D PO) Take 1 tablet by mouth daily.    [provider]  clobetasol cream (TEMOVATE) 0.05 % Apply 1 application topically daily. Apply to rash.    [provider]  Cyanocobalamin (VITAMIN B-12 PO) Take 1 tablet by mouth daily.     [provider]  EPINEPHrine (EPI-PEN) 0.3 mg/0.3 mL SOAJ Inject 0.3 mg into the muscle once as needed (for allergic reaction to pollen).    [provider]  gabapentin (NEURONTIN) 300 MG capsule Take 300 mg by mouth at bedtime.    [provider]  glipiZIDE (GLUCOTROL) 5 MG tablet Take by mouth daily before breakfast.    [provider]  hydrALAZINE (APRESOLINE) 50 MG tablet Take 50 mg by mouth 2 (two) times daily.     [provider]  hydrochlorothiazide (HYDRODIURIL) 25 MG tablet Take 1 tablet (25 mg total) by mouth daily. 12/04/15   Rodolph Bong, MD  HYDROcodone-acetaminophen (NORCO/VICODIN) 5-325 MG per tablet Take 1 tablet by mouth every 6 (six) hours as needed for pain.    [provider]  HYDROcodone-acetaminophen (NORCO/VICODIN) 5-325 MG tablet Take 1-2 tablets by mouth every 6 (six) hours as needed for moderate pain. 05/14/16   Cathren Laine, MD    hydrOXYzine (VISTARIL) 50 MG capsule Take 50 mg by mouth 4 (four) times daily as needed for itching.    [provider]  meloxicam (MOBIC) 7.5 MG tablet Take 7.5 mg by mouth daily.    [provider]  mirtazapine (REMERON) 15 MG tablet Take 15 mg by mouth at bedtime as needed (for sleep).    [provider]  nebivolol 20 MG TABS Take 1 tablet (20 mg total) by mouth daily. 12/05/15   Rodolph Bong, MD  nitroGLYCERIN (NITROSTAT) 0.4 MG SL tablet Place 1 tablet (0.4 mg total) under the tongue every 5 (five) minutes as needed for chest pain. 12/04/15   Rodolph Bong, MD  ondansetron (ZOFRAN-ODT) 4 MG disintegrating tablet Take 1 tablet (4 mg total) by mouth every 8 (eight) hours as needed for nausea or vomiting. 06/30/16   Benjiman Core, MD  pantoprazole (PROTONIX) 40 MG tablet Take 40 mg by mouth  daily.     [provider]  polyethylene glycol (MIRALAX) packet Take 17 g by mouth daily. 06/30/16   Benjiman Core, MD  Polyvinyl Alcohol-Povidone (REFRESH OP) Place 1 drop into both eyes daily as needed (for dry eyes).    [provider]  predniSONE (DELTASONE) 20 MG tablet 3 po once a day for 1 days, then 2 po once a day for 2 days, then 1 po once a day for 2 days 05/15/16   Cathren Laine, MD  promethazine (PHENERGAN) 25 MG tablet Take 25 mg by mouth every 6 (six) hours as needed for nausea.    [provider]  tiotropium (SPIRIVA) 18 MCG inhalation capsule Place 18 mcg into inhaler and inhale daily.    [provider]  traMADol (ULTRAM) 50 MG tablet Take 1 tablet (50 mg total) by mouth every 6 (six) hours as needed. Take 1 tablet 3 times daily x 5 days, then every 6 hours as needed. 12/04/15   Rodolph Bong, MD    Family History Family History  Problem Relation Age of Onset  . Hyperlipidemia Sister   . Heart attack Sister   . Diabetes Daughter   . Stroke Father   . Heart attack Father     Social History Social History    Tobacco Use  . Smoking status: Never Smoker  . Smokeless tobacco: Never Used  Substance Use Topics  . Alcohol use: No  . Drug use: No     Allergies   Ciprofloxacin and Erythromycin   Review of Systems Review of Systems  All other systems reviewed and are negative.    Physical Exam Updated Vital Signs BP (!) 150/79 (BP Location: Left Arm)   Pulse 75   Temp 98.1 F (36.7 C) (Oral)   Resp 20   Ht 5\' 7"  (1.702 m)   Wt 124.7 kg (275 lb)   SpO2 98%   BMI 43.07 kg/m   Physical Exam  Constitutional: She appears well-developed and well-nourished.  HENT:  Head: Normocephalic and atraumatic.  Right Ear: External ear normal.  Left Ear: External ear normal.  Nose: Nose normal.  Mouth/Throat: Uvula is midline, oropharynx is clear and moist and mucous membranes are normal. No tonsillar exudate.  Eyes: Pupils are equal, round, and reactive to light. Right eye exhibits no discharge. Left eye exhibits no discharge. No scleral icterus.  Neck: Trachea normal. Neck supple. No spinous process tenderness present. No neck rigidity. Normal range of motion present.  Cardiovascular: Normal rate, regular rhythm and intact distal pulses.  No murmur heard. Pulses:      Radial pulses are 2+ on the right side, and 2+ on the left side.       Dorsalis pedis pulses are 2+ on the right side, and 2+ on the left side.       Posterior tibial pulses are 2+ on the right side, and 2+ on the left side.  No lower extremity swelling or edema. Calves symmetric in size bilaterally.  Pulmonary/Chest: Effort normal. No accessory muscle usage. Tachypnea noted. No respiratory distress. She has wheezes. She exhibits no tenderness.  Patient satting at 98% on room air with good waveform on monitor.  There is no accessory muscle use.  Patient able to phonate in full sentences without difficulty.  There is global inspiratory and expiratory wheezing throughout.  Abdominal: Soft. Bowel sounds are normal. There is no  tenderness. There is no rebound and no guarding.  Musculoskeletal: She exhibits no edema.  Lymphadenopathy:  She has no cervical adenopathy.  Neurological: She is alert.  Skin: Skin is warm and dry. No rash noted. She is not diaphoretic.  Psychiatric: She has a normal mood and affect.  Nursing note and vitals reviewed.    ED Treatments / Results  Labs (all labs ordered are listed, but only abnormal results are displayed) Labs Reviewed  BASIC METABOLIC PANEL - Abnormal; Notable for the following components:      Result Value   Potassium 3.4 (*)    Glucose, Bld 119 (*)    All other components within normal limits  CBC WITH DIFFERENTIAL/PLATELET - Abnormal; Notable for the following components:   Lymphs Abs 5.3 (*)    All other components within normal limits  TROPONIN I  BRAIN NATRIURETIC PEPTIDE    EKG EKG Interpretation  Date/Time:  Saturday September 17 2017 14:21:39 EDT Ventricular Rate:  75 PR Interval:    QRS Duration: 82 QT Interval:  399 QTC Calculation: 446 R Axis:   43 Text Interpretation:  Sinus rhythm Prolonged PR interval Baseline wander in lead(s) V2 No significant change since last tracing Confirmed by Vanetta Mulders [phone]) on 09/17/2017 2:43:57 PM   Radiology Dg Chest 2 View  Result Date: 09/17/2017 CLINICAL DATA:  Shortness of breath and cough EXAM: CHEST - 2 VIEW COMPARISON:  Jul 19, 2017 FINDINGS: There is no edema or consolidation. The heart size and pulmonary vascularity are normal. No adenopathy. There is degenerative change in the thoracic spine. IMPRESSION: No edema or consolidation. Electronically Signed   By: Bretta Bang III M.D.   On: 09/17/2017 14:21    Procedures Procedures (including critical care time) CRITICAL CARE Performed by: Jacinto Halim   Total critical care time: 45 minutes - patient with shortness of breath requiring multiple breathing treatments.   Critical care time was exclusive of separately billable procedures and  treating other patients.  Critical care was necessary to treat or prevent imminent or life-threatening deterioration.  Critical care was time spent personally by me on the following activities: development of treatment plan with patient and/or surrogate as well as nursing, discussions with consultants, evaluation of patient's response to treatment, examination of patient, obtaining history from patient or surrogate, ordering and performing treatments and interventions, ordering and review of laboratory studies, ordering and review of radiographic studies, pulse oximetry and re-evaluation of patient's condition.  Medications Ordered in ED Medications  methylPREDNISolone sodium succinate (SOLU-MEDROL) 125 mg/2 mL injection 125 mg (has no administration in time range)  albuterol (PROVENTIL,VENTOLIN) solution continuous neb (has no administration in time range)  ipratropium (ATROVENT) nebulizer solution 0.5 mg (has no administration in time range)  ipratropium-albuterol (DUONEB) 0.5-2.5 (3) MG/3ML nebulizer solution 3 mL (3 mLs Nebulization Given 09/17/17 1421)  albuterol (PROVENTIL) (2.5 MG/3ML) 0.083% nebulizer solution 2.5 mg (2.5 mg Nebulization Given 09/17/17 1421)     Initial Impression / Assessment and Plan / ED Course  I have reviewed the triage vital signs and the nursing notes.  Pertinent labs & imaging results that were available during my care of the patient were reviewed by me and considered in my medical decision making (see chart for details).     77 y.o. female with history of COPD, asthma, CAD who presents the emergent department today for dyspnea on exertion that began in 2-3 days ago.  Patient reports she has been having dyspnea on exertion with less activity than normal.  She denies any exertional chest pain, nausea, diaphoresis.  She states that she does have  some mild chest pain when coughing but denies any otherwise.  She is been trying her home Symbicort and albuterol inhaler  without any relief.  On exam patient is with reassuring vitals.  There is no fever, tachycardia, tachypnea, hypoxia or hypotension.  Patient does not appear to be fluid overloaded.  Patient is satting at 98% with good waveform on monitor.  There is no accessory muscle use.  Mild tachypnea.  There is global inspiratory and expiratory wheezing throughout.  Patient given DuoNeb along treatment as well as 1 ipratropium and 125 mg of Solu-Medrol.  Patient's EKG with sinus rhythm.  No significant change from prior.  No STEMI.  Troponin within normal limits.  Symptoms atypical for ACS.  Mild hypokalemia 3.4. Replaced orally. Likely 2/2 albuterol use. No acute kidney injury.  No leukocytosis.  No anemia.  BNP within normal limits.  Chest x-ray without edema, cardiomegaly or consolidation.  Patient states full resolution of symptoms after breathing treatment.  Patient ambulated in ED with O2 saturations maintained >90, no current signs of respiratory distress. Lung exam improved after nebulizer treatment. IV solumedrol given in the ED and pt will be discharged with 5 day burst of prednisone. Pt states they are breathing at baseline. Pt has been instructed to continue using prescribed medications and to speak with PCP about today's exacerbation. I advised the patient to follow-up with PCP this week.  Patient does have a history of COPD and has change in cough.  Will cover with Doxycyline (azithromycin allergy). Albuterol inhaler given in the department.  Specific return precautions discussed. Time was given for all questions to be answered. The patient verbalized understanding and agreement with plan. The patient appears safe for discharge home.  Patient case seen and discussed with Dr. Madilyn Hookees who is in agreement with plan.   Final Clinical Impressions(s) / ED Diagnoses   Final diagnoses:  Shortness of breath  History of COPD  Cough    ED Discharge Orders        Ordered    doxycycline (VIBRAMYCIN) 100 MG  capsule  2 times daily     09/17/17 1723    predniSONE (DELTASONE) 10 MG tablet  Daily     09/17/17 1723    benzonatate (TESSALON) 100 MG capsule  Every 8 hours     09/17/17 1723       Princella PellegriniMaczis, Rubens Cranston M, PA-C 09/17/17 1724    Tilden Fossaees, Elizabeth, MD 09/18/17 1550

## 2017-09-17 NOTE — ED Notes (Signed)
Patient transported to X-ray 

## 2017-09-30 DIAGNOSIS — I10 Essential (primary) hypertension: Secondary | ICD-10-CM | POA: Diagnosis not present

## 2017-09-30 DIAGNOSIS — E1151 Type 2 diabetes mellitus with diabetic peripheral angiopathy without gangrene: Secondary | ICD-10-CM | POA: Diagnosis not present

## 2017-09-30 DIAGNOSIS — M109 Gout, unspecified: Secondary | ICD-10-CM | POA: Diagnosis not present

## 2017-10-04 DIAGNOSIS — H21552 Recession of chamber angle, left eye: Secondary | ICD-10-CM | POA: Diagnosis not present

## 2017-10-04 DIAGNOSIS — E113551 Type 2 diabetes mellitus with stable proliferative diabetic retinopathy, right eye: Secondary | ICD-10-CM | POA: Diagnosis not present

## 2017-10-15 DIAGNOSIS — J45909 Unspecified asthma, uncomplicated: Secondary | ICD-10-CM | POA: Diagnosis not present

## 2017-10-21 DIAGNOSIS — K824 Cholesterolosis of gallbladder: Secondary | ICD-10-CM | POA: Diagnosis not present

## 2017-10-21 DIAGNOSIS — R1012 Left upper quadrant pain: Secondary | ICD-10-CM | POA: Diagnosis not present

## 2017-10-24 DIAGNOSIS — B351 Tinea unguium: Secondary | ICD-10-CM | POA: Diagnosis not present

## 2017-10-24 DIAGNOSIS — M79671 Pain in right foot: Secondary | ICD-10-CM | POA: Diagnosis not present

## 2017-10-24 DIAGNOSIS — M79672 Pain in left foot: Secondary | ICD-10-CM | POA: Diagnosis not present

## 2017-10-24 DIAGNOSIS — E119 Type 2 diabetes mellitus without complications: Secondary | ICD-10-CM | POA: Diagnosis not present

## 2017-10-24 DIAGNOSIS — L84 Corns and callosities: Secondary | ICD-10-CM | POA: Diagnosis not present

## 2017-11-15 DIAGNOSIS — J45909 Unspecified asthma, uncomplicated: Secondary | ICD-10-CM | POA: Diagnosis not present

## 2017-11-22 DIAGNOSIS — Z7984 Long term (current) use of oral hypoglycemic drugs: Secondary | ICD-10-CM | POA: Diagnosis not present

## 2017-11-22 DIAGNOSIS — H21552 Recession of chamber angle, left eye: Secondary | ICD-10-CM | POA: Diagnosis not present

## 2017-11-22 DIAGNOSIS — H4311 Vitreous hemorrhage, right eye: Secondary | ICD-10-CM | POA: Diagnosis not present

## 2017-11-22 DIAGNOSIS — E113551 Type 2 diabetes mellitus with stable proliferative diabetic retinopathy, right eye: Secondary | ICD-10-CM | POA: Diagnosis not present

## 2017-11-23 DIAGNOSIS — Z9861 Coronary angioplasty status: Secondary | ICD-10-CM | POA: Diagnosis not present

## 2017-11-23 DIAGNOSIS — I5033 Acute on chronic diastolic (congestive) heart failure: Secondary | ICD-10-CM | POA: Diagnosis not present

## 2017-11-23 DIAGNOSIS — R0609 Other forms of dyspnea: Secondary | ICD-10-CM | POA: Diagnosis not present

## 2017-11-23 DIAGNOSIS — I2510 Atherosclerotic heart disease of native coronary artery without angina pectoris: Secondary | ICD-10-CM | POA: Diagnosis not present

## 2017-12-15 DIAGNOSIS — J45909 Unspecified asthma, uncomplicated: Secondary | ICD-10-CM | POA: Diagnosis not present

## 2017-12-20 DIAGNOSIS — E1140 Type 2 diabetes mellitus with diabetic neuropathy, unspecified: Secondary | ICD-10-CM | POA: Diagnosis not present

## 2017-12-20 DIAGNOSIS — J40 Bronchitis, not specified as acute or chronic: Secondary | ICD-10-CM | POA: Diagnosis not present

## 2017-12-20 DIAGNOSIS — E1151 Type 2 diabetes mellitus with diabetic peripheral angiopathy without gangrene: Secondary | ICD-10-CM | POA: Diagnosis not present

## 2017-12-20 DIAGNOSIS — R109 Unspecified abdominal pain: Secondary | ICD-10-CM | POA: Diagnosis not present

## 2017-12-20 DIAGNOSIS — E785 Hyperlipidemia, unspecified: Secondary | ICD-10-CM | POA: Diagnosis not present

## 2017-12-20 DIAGNOSIS — I10 Essential (primary) hypertension: Secondary | ICD-10-CM | POA: Diagnosis not present

## 2017-12-27 DIAGNOSIS — I10 Essential (primary) hypertension: Secondary | ICD-10-CM | POA: Diagnosis not present

## 2017-12-27 DIAGNOSIS — J302 Other seasonal allergic rhinitis: Secondary | ICD-10-CM | POA: Diagnosis not present

## 2017-12-27 DIAGNOSIS — E785 Hyperlipidemia, unspecified: Secondary | ICD-10-CM | POA: Diagnosis not present

## 2017-12-27 DIAGNOSIS — E1151 Type 2 diabetes mellitus with diabetic peripheral angiopathy without gangrene: Secondary | ICD-10-CM | POA: Diagnosis not present

## 2017-12-27 DIAGNOSIS — E1140 Type 2 diabetes mellitus with diabetic neuropathy, unspecified: Secondary | ICD-10-CM | POA: Diagnosis not present

## 2017-12-27 DIAGNOSIS — Z23 Encounter for immunization: Secondary | ICD-10-CM | POA: Diagnosis not present

## 2018-01-06 ENCOUNTER — Other Ambulatory Visit: Payer: Self-pay | Admitting: Physician Assistant

## 2018-01-06 DIAGNOSIS — Z1231 Encounter for screening mammogram for malignant neoplasm of breast: Secondary | ICD-10-CM

## 2018-01-15 DIAGNOSIS — J45909 Unspecified asthma, uncomplicated: Secondary | ICD-10-CM | POA: Diagnosis not present

## 2018-01-25 ENCOUNTER — Other Ambulatory Visit: Payer: Self-pay

## 2018-01-25 NOTE — Patient Outreach (Signed)
[facility] Care Management  01/25/2018  Rogene HoustonVersie M Hill 1940/05/07 [id number]   Medication Adherence call to Mrs. Angie HeinzVersie Mahmood spoke with patient she is not sure is she is to be on Atorvastatin 80 mg because she did not have any refills on the medication but, she has an appointment tomorrow morning and will go over it with  doctor. Mrs. Angie Hill is showing past due under [employer].   Lillia AbedAna Ollison-Moran CPhT Pharmacy Technician [facility] Care Management Direct Dial [phone]  Fax [phone] [email or web address]

## 2018-01-26 DIAGNOSIS — E1140 Type 2 diabetes mellitus with diabetic neuropathy, unspecified: Secondary | ICD-10-CM | POA: Diagnosis not present

## 2018-01-26 DIAGNOSIS — M109 Gout, unspecified: Secondary | ICD-10-CM | POA: Diagnosis not present

## 2018-01-26 DIAGNOSIS — I10 Essential (primary) hypertension: Secondary | ICD-10-CM | POA: Diagnosis not present

## 2018-01-26 DIAGNOSIS — E785 Hyperlipidemia, unspecified: Secondary | ICD-10-CM | POA: Diagnosis not present

## 2018-01-26 DIAGNOSIS — E1151 Type 2 diabetes mellitus with diabetic peripheral angiopathy without gangrene: Secondary | ICD-10-CM | POA: Diagnosis not present

## 2018-02-14 DIAGNOSIS — J45909 Unspecified asthma, uncomplicated: Secondary | ICD-10-CM | POA: Diagnosis not present

## 2018-02-16 ENCOUNTER — Other Ambulatory Visit: Payer: Self-pay

## 2018-02-16 NOTE — Patient Outreach (Signed)
[facility]) Care Management  02/16/2018  Rogene HoustonVersie M Hill Feb 15, 1941 [id number]   Medication Adherence call to Mrs. Angie LewandowskyVersi Hill spoke with patient she is due on Glipizide Er 5 mg patient ask if we can call [facility] an order this medication and have them deliver for her and add any other medication due so they can deliver all at one time. [facility] will deliver on Friday morning for the patient.Angie Hill is showing past due under [facility] [facility] [employer].   Angie AbedAna Hill CPhT Pharmacy Technician [facility] Care Management Direct Dial [phone]  Fax [phone] [email or web address]

## 2018-02-23 DIAGNOSIS — E1151 Type 2 diabetes mellitus with diabetic peripheral angiopathy without gangrene: Secondary | ICD-10-CM | POA: Diagnosis not present

## 2018-02-23 DIAGNOSIS — E785 Hyperlipidemia, unspecified: Secondary | ICD-10-CM | POA: Diagnosis not present

## 2018-02-23 DIAGNOSIS — J302 Other seasonal allergic rhinitis: Secondary | ICD-10-CM | POA: Diagnosis not present

## 2018-02-23 DIAGNOSIS — I10 Essential (primary) hypertension: Secondary | ICD-10-CM | POA: Diagnosis not present

## 2018-02-23 DIAGNOSIS — E1140 Type 2 diabetes mellitus with diabetic neuropathy, unspecified: Secondary | ICD-10-CM | POA: Diagnosis not present

## 2018-02-23 DIAGNOSIS — J40 Bronchitis, not specified as acute or chronic: Secondary | ICD-10-CM | POA: Diagnosis not present

## 2018-03-17 DIAGNOSIS — J45909 Unspecified asthma, uncomplicated: Secondary | ICD-10-CM | POA: Diagnosis not present

## 2018-03-23 DIAGNOSIS — K2270 Barrett's esophagus without dysplasia: Secondary | ICD-10-CM | POA: Diagnosis not present

## 2018-03-23 DIAGNOSIS — Z86010 Personal history of colonic polyps: Secondary | ICD-10-CM | POA: Diagnosis not present

## 2018-03-23 DIAGNOSIS — Z1211 Encounter for screening for malignant neoplasm of colon: Secondary | ICD-10-CM | POA: Diagnosis not present

## 2018-03-23 DIAGNOSIS — Z8719 Personal history of other diseases of the digestive system: Secondary | ICD-10-CM | POA: Diagnosis not present

## 2018-03-23 DIAGNOSIS — K5909 Other constipation: Secondary | ICD-10-CM | POA: Diagnosis not present

## 2018-03-25 ENCOUNTER — Emergency Department (HOSPITAL_BASED_OUTPATIENT_CLINIC_OR_DEPARTMENT_OTHER)
Admission: EM | Admit: 2018-03-25 | Discharge: 2018-03-25 | Disposition: A | Discharge status: Home / Self Care | Payer: Medicare Other | Attending: Emergency Medicine | Admitting: Emergency Medicine

## 2018-03-25 ENCOUNTER — Encounter (HOSPITAL_BASED_OUTPATIENT_CLINIC_OR_DEPARTMENT_OTHER): Payer: Self-pay | Admitting: *Deleted

## 2018-03-25 ENCOUNTER — Emergency Department (HOSPITAL_BASED_OUTPATIENT_CLINIC_OR_DEPARTMENT_OTHER): Payer: Medicare Other

## 2018-03-25 ENCOUNTER — Other Ambulatory Visit: Payer: Self-pay

## 2018-03-25 DIAGNOSIS — J45909 Unspecified asthma, uncomplicated: Secondary | ICD-10-CM | POA: Insufficient documentation

## 2018-03-25 DIAGNOSIS — J441 Chronic obstructive pulmonary disease with (acute) exacerbation: Secondary | ICD-10-CM | POA: Insufficient documentation

## 2018-03-25 DIAGNOSIS — Z7982 Long term (current) use of aspirin: Secondary | ICD-10-CM | POA: Insufficient documentation

## 2018-03-25 DIAGNOSIS — I2510 Atherosclerotic heart disease of native coronary artery without angina pectoris: Secondary | ICD-10-CM | POA: Diagnosis not present

## 2018-03-25 DIAGNOSIS — R109 Unspecified abdominal pain: Secondary | ICD-10-CM | POA: Diagnosis not present

## 2018-03-25 DIAGNOSIS — M25512 Pain in left shoulder: Secondary | ICD-10-CM

## 2018-03-25 DIAGNOSIS — I10 Essential (primary) hypertension: Secondary | ICD-10-CM | POA: Insufficient documentation

## 2018-03-25 DIAGNOSIS — R05 Cough: Secondary | ICD-10-CM | POA: Diagnosis not present

## 2018-03-25 DIAGNOSIS — Z7984 Long term (current) use of oral hypoglycemic drugs: Secondary | ICD-10-CM | POA: Insufficient documentation

## 2018-03-25 DIAGNOSIS — Z79899 Other long term (current) drug therapy: Secondary | ICD-10-CM | POA: Diagnosis not present

## 2018-03-25 DIAGNOSIS — R0602 Shortness of breath: Secondary | ICD-10-CM | POA: Diagnosis present

## 2018-03-25 DIAGNOSIS — E119 Type 2 diabetes mellitus without complications: Secondary | ICD-10-CM | POA: Diagnosis not present

## 2018-03-25 DIAGNOSIS — R062 Wheezing: Secondary | ICD-10-CM | POA: Diagnosis not present

## 2018-03-25 DIAGNOSIS — M19012 Primary osteoarthritis, left shoulder: Secondary | ICD-10-CM | POA: Diagnosis not present

## 2018-03-25 MED ORDER — PREDNISONE 20 MG PO TABS: 40.0000 mg | ORAL_TABLET | Freq: Every day | ORAL | 0 refills | Status: AC

## 2018-03-25 MED ORDER — DEXAMETHASONE 6 MG PO TABS
10.0000 mg | ORAL_TABLET | Freq: Once | ORAL | Status: AC
  Administered 2018-03-25: 10 mg via ORAL
  Filled 2018-03-25: qty 1

## 2018-03-25 MED ORDER — AEROCHAMBER PLUS FLO-VU MEDIUM MISC
1.0000 | Freq: Once | Status: AC
  Administered 2018-03-25: 1
  Filled 2018-03-25: qty 1

## 2018-03-25 MED ORDER — AZITHROMYCIN 250 MG PO TABS: ORAL_TABLET | ORAL | 0 refills | Status: DC

## 2018-03-25 MED ORDER — IPRATROPIUM-ALBUTEROL 0.5-2.5 (3) MG/3ML IN SOLN
3.0000 mL | Freq: Once | RESPIRATORY_TRACT | Status: AC
  Administered 2018-03-25: 3 mL via RESPIRATORY_TRACT
  Filled 2018-03-25: qty 3

## 2018-03-25 NOTE — ED Notes (Signed)
RT at bedside administering duoneb

## 2018-03-25 NOTE — Discharge Instructions (Signed)
Your cough and shortness of breath are likely due to having a COPD exacerbation.  We will send you home with azithromycin to take for 5 days.  You also need to take prednisone 40 mg (2 tablets) every morning starting tomorrow for 4 days.  For your shortness of breath you may use your albuterol inhaler as needed, it will work better if you use the spacer provided today in the emergency room.  For your left shoulder pain you may follow-up with your regular doctor and we have given you information to get in contact with Dr. Norton Blizzard who may also help you with your shoulder pain..  If you develop any worsening shortness of breath, chest pain or worsening fevers then please do not hesitate return to the emergency room.

## 2018-03-25 NOTE — ED Triage Notes (Signed)
Pt c/o cough, congestion, nausea x 5 days. Also chronic left shoulder pain, states she feels like her upper arm is swollen

## 2018-03-25 NOTE — ED Provider Notes (Signed)
[facility] EMERGENCY DEPARTMENT Provider Note   CSN: [id number] Arrival date & time: 03/25/18  1625   History   Chief Complaint Chief Complaint  Patient presents with  . Cough  . Arm Pain    HPI Angie Hill is a 78 y.o. female with PMH of COPD, CAD, HTN, T2DM, and OSA.  Patient reports that she has had a productive cough with worsening shortness of breath for the past 2 weeks.  She has not been seen by her regular physician.  Reports that she has known COPD and has been using her albuterol and Symbicort inhalers daily.  She has not had to use increasing amount of albuterol until this past Thursday where she had to use her albuterol inhaler 3 times in 1 day.  She is coming in today because she continues to feel worse.  She has only tried over-the-counter guaifenesin and phenylephrine for her cough.  Patient denies any fevers or chills.  States that her shortness of breath is worse with exertion.  Does report that she has had some congestion that she believes started behind her left eye and then caused some ear pain but this improved.  States that she has been around multiple sick contacts in her family as most of her relatives have had cold-like symptoms.  Denies any body aches.  Patient is also reporting that she has had 2 weeks of worsening left shoulder pain.  She does not remember any inciting injury however her grandmother does note that she had a fall about 2 weeks ago at which point patient notes that her pain did get a little worse but she thinks that her pain started before the fall.  Patient has not tried anything for her shoulder but has just been unable to move it as much.  She has not seen her doctor about this pain.  Denies any history of gout.  Does report that she feels as if her arm has had a little bit of swelling.  Denies any numbness, tingling, or weakness in left arm.  The history is provided by the patient and a relative.    Past Medical History:    Diagnosis Date  . Anemia   . Angina pectoris (HCC)   . Arthritis   . Asthma   . COPD (chronic obstructive pulmonary disease) (HCC)   . Coronary artery disease   . Depression   . Diabetes mellitus without complication (HCC)    BORDERLINE  . GERD (gastroesophageal reflux disease)   . H/O hiatal hernia   . Hypertension   . Shortness of breath   . Sleep apnea     Patient Active Problem List   Diagnosis Date Noted  . Epigastric pain   . Chest pain, musculoskeletal 12/03/2015  . 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 Onychomycosis 09/01/2012  . Pain in joint, ankle and foot 09/01/2012  . Dermatitis 09/01/2012  . Hereditary and idiopathic peripheral neuropathy 09/01/2012  . S/P PTCA (percutaneous transluminal coronary angioplasty) 07/05/2012  . Essential hypertension 07/05/2012  . Hyperlipidemia 07/05/2012    Past Surgical History:  Procedure Laterality Date  . ABDOMINAL HYSTERECTOMY     partial  . CORONARY ANGIOPLASTY WITH STENT PLACEMENT  07/04/2012   mid LAD  . LEFT AND RIGHT HEART CATHETERIZATION WITH CORONARY ANGIOGRAM N/A 07/04/2012   Procedure: LEFT AND RIGHT HEART CATHETERIZATION WITH CORONARY ANGIOGRAM;  Surgeon: Pamella Pert, MD;  Location: [facility] CATH LAB;  Service: Cardiovascular;  Laterality: N/A;  .  LEFT HEART CATHETERIZATION WITH CORONARY ANGIOGRAM N/A 10/17/2012   Procedure: LEFT HEART CATHETERIZATION WITH CORONARY ANGIOGRAM;  Surgeon: Pamella Pert, MD;  Location: [facility] CATH LAB;  Service: Cardiovascular;  Laterality: N/A;  . PARTIAL COLECTOMY  1982  . PERCUTANEOUS CORONARY STENT INTERVENTION (PCI-S)  07/04/2012   Procedure: PERCUTANEOUS CORONARY STENT INTERVENTION (PCI-S);  Surgeon: Pamella Pert, MD;  Location: [facility] CATH LAB;  Service: Cardiovascular;;  . TUBAL LIGATION       OB History   No obstetric history on file.      Home Medications    Prior to Admission medications   Medication Sig Start Date End Date Taking?  Authorizing Provider  albuterol (PROVENTIL HFA;VENTOLIN HFA) 108 (90 Base) MCG/ACT inhaler Inhale 2 puffs into the lungs 2 (two) times daily as needed for wheezing or shortness of breath. 04/17/17   Horton, Mayer Masker, MD  allopurinol (ZYLOPRIM) 300 MG tablet Take 300 mg by mouth daily.    [provider]  amLODipine-valsartan (EXFORGE) 10-320 MG per tablet Take 1 tablet by mouth every morning.    [provider]  amoxicillin (AMOXIL) 500 MG capsule Take 1 capsule (500 mg total) by mouth 3 (three) times daily. Patient not taking: Reported on 07/13/2017 03/17/17   Rolland Porter, MD  aspirin EC 81 MG tablet Take 81 mg by mouth daily.    [provider]  atorvastatin (LIPITOR) 80 MG tablet Take 80 mg by mouth daily.    [provider]  azithromycin (ZITHROMAX) 250 MG tablet Take 2 tabs day 1, then 1 tab daily 03/25/18   Delmore Sear, [place], DO  b complex vitamins tablet Take 1 tablet by mouth daily.    [provider]  benzonatate (TESSALON) 100 MG capsule Take 1 capsule (100 mg total) by mouth every 8 (eight) hours. 09/17/17   Maczis, Elmer Sow, PA-C  budesonide-formoterol (SYMBICORT) 160-4.5 MCG/ACT inhaler Inhale 2 puffs into the lungs daily.    [provider]  Calcium Carbonate-Vitamin D (CALCIUM + D PO) Take 1 tablet by mouth daily.    [provider]  clobetasol cream (TEMOVATE) 0.05 % Apply 1 application topically daily. Apply to rash.    [provider]  Cyanocobalamin (VITAMIN B-12 PO) Take 1 tablet by mouth daily.     [provider]  doxycycline (VIBRAMYCIN) 100 MG capsule Take 1 capsule (100 mg total) by mouth 2 (two) times daily. 09/17/17   Maczis, Elmer Sow, PA-C  EPINEPHrine (EPI-PEN) 0.3 mg/0.3 mL SOAJ Inject 0.3 mg into the muscle once as needed (for allergic reaction to pollen).    [provider]  gabapentin (NEURONTIN) 300 MG capsule Take 300 mg by mouth at bedtime.    [provider]  glipiZIDE  (GLUCOTROL) 5 MG tablet Take by mouth daily before breakfast.    [provider]  hydrALAZINE (APRESOLINE) 50 MG tablet Take 50 mg by mouth 2 (two) times daily.     [provider]  hydrochlorothiazide (HYDRODIURIL) 25 MG tablet Take 1 tablet (25 mg total) by mouth daily. 12/04/15   Rodolph Bong, MD  HYDROcodone-acetaminophen (NORCO/VICODIN) 5-325 MG per tablet Take 1 tablet by mouth every 6 (six) hours as needed for pain.    [provider]  HYDROcodone-acetaminophen (NORCO/VICODIN) 5-325 MG tablet Take 1-2 tablets by mouth every 6 (six) hours as needed for moderate pain. 05/14/16   Cathren Laine, MD  hydrOXYzine (VISTARIL) 50 MG capsule Take 50 mg by mouth 4 (four) times daily as needed for itching.  [provider]  meloxicam (MOBIC) 7.5 MG tablet Take 7.5 mg by mouth daily.    [provider]  mirtazapine (REMERON) 15 MG tablet Take 15 mg by mouth at bedtime as needed (for sleep).    [provider]  nebivolol 20 MG TABS Take 1 tablet (20 mg total) by mouth daily. 12/05/15   Rodolph Bonghompson, Daniel V, MD  nitroGLYCERIN (NITROSTAT) 0.4 MG SL tablet Place 1 tablet (0.4 mg total) under the tongue every 5 (five) minutes as needed for chest pain. 12/04/15   Rodolph Bonghompson, Daniel V, MD  ondansetron (ZOFRAN-ODT) 4 MG disintegrating tablet Take 1 tablet (4 mg total) by mouth every 8 (eight) hours as needed for nausea or vomiting. 06/30/16   Benjiman CorePickering, Nathan, MD  pantoprazole (PROTONIX) 40 MG tablet Take 40 mg by mouth daily.     [provider]  polyethylene glycol (MIRALAX) packet Take 17 g by mouth daily. 06/30/16   Benjiman CorePickering, Nathan, MD  Polyvinyl Alcohol-Povidone (REFRESH OP) Place 1 drop into both eyes daily as needed (for dry eyes).    [provider]  predniSONE (DELTASONE) 20 MG tablet Take 2 tablets (40 mg total) by mouth daily with breakfast for 4 days. 03/25/18 03/29/18  Latasia Silberstein, [place], DO  promethazine (PHENERGAN) 25 MG tablet Take  25 mg by mouth every 6 (six) hours as needed for nausea.    [provider]  tiotropium (SPIRIVA) 18 MCG inhalation capsule Place 18 mcg into inhaler and inhale daily.    [provider]  traMADol (ULTRAM) 50 MG tablet Take 1 tablet (50 mg total) by mouth every 6 (six) hours as needed. Take 1 tablet 3 times daily x 5 days, then every 6 hours as needed. 12/04/15   Rodolph Bonghompson, Daniel V, MD    Family History Family History  Problem Relation Age of Onset  . Hyperlipidemia Sister   . Heart attack Sister   . Diabetes Daughter   . Stroke Father   . Heart attack Father     Social History Social History   Tobacco Use  . Smoking status: Never Smoker  . Smokeless tobacco: Never Used  Substance Use Topics  . Alcohol use: No  . Drug use: No     Allergies   Ciprofloxacin and Erythromycin   Review of Systems Review of Systems  Constitutional: Negative for chills and fever.  HENT: Positive for congestion, ear pain, rhinorrhea and sinus pain. Negative for sore throat.   Respiratory: Positive for cough, shortness of breath and wheezing. Negative for chest tightness.   Cardiovascular: Negative for chest pain and leg swelling.  Gastrointestinal: Positive for abdominal pain and constipation. Negative for diarrhea, nausea and vomiting.  Genitourinary: Negative for dysuria.  Musculoskeletal: Positive for arthralgias.  Neurological: Negative for dizziness.  All other systems reviewed and are negative.  Physical Exam Updated Vital Signs BP (!) 159/78 (BP Location: Left Arm)   Pulse 81   Temp 98.2 F (36.8 C) (Oral)   Resp 20   Ht 5\' 8"  (1.727 m)   Wt 117.9 kg   SpO2 100%   BMI 39.53 kg/m   Physical Exam Vitals signs and nursing note reviewed.  Constitutional:      Appearance: Normal appearance. She is obese.  HENT:     Head: Normocephalic and atraumatic.     Nose: Nose normal.  Eyes:     Conjunctiva/sclera: Conjunctivae normal.     Pupils: Pupils are equal,  round, and reactive to light.  Neck:  Musculoskeletal: Normal range of motion and neck supple.  Cardiovascular:     Rate and Rhythm: Normal rate and regular rhythm.  Pulmonary:     Effort: Pulmonary effort is normal.     Breath sounds: Decreased air movement present. Examination of the right-upper field reveals wheezing. Examination of the left-upper field reveals wheezing. Examination of the right-lower field reveals decreased breath sounds. Examination of the left-lower field reveals decreased breath sounds. Decreased breath sounds and wheezing present.  Abdominal:     General: Abdomen is flat. There is no distension.     Palpations: Abdomen is soft. There is no mass.     Tenderness: There is no abdominal tenderness. There is no guarding.  Musculoskeletal:     Left shoulder: She exhibits decreased range of motion and tenderness. She exhibits no swelling and no effusion.  Skin:    General: Skin is warm.  Neurological:     General: No focal deficit present.     Mental Status: She is alert and oriented to person, place, and time.  Psychiatric:        Mood and Affect: Mood normal.        Behavior: Behavior normal.        Thought Content: Thought content normal.        Judgment: Judgment normal.      ED Treatments / Results  Labs (all labs ordered are listed, but only abnormal results are displayed) Labs Reviewed - No data to display  EKG None  Radiology Dg Chest 2 View  Result Date: 03/25/2018 CLINICAL DATA:  Left shoulder pain for 2 weeks.  Productive cough. EXAM: CHEST - 2 VIEW COMPARISON:  09/17/2017 FINDINGS: Cardiopericardial silhouette is at upper limits of normal for size. The lungs are clear without focal pneumonia, edema, pneumothorax or pleural effusion. The visualized bony structures of the thorax are intact. IMPRESSION: No active cardiopulmonary disease. Electronically Signed   By: Kennith Center M.D.   On: 03/25/2018 17:41   Dg Shoulder Left  Result Date:  03/25/2018 CLINICAL DATA:  Left shoulder pain for 2 weeks. EXAM: LEFT SHOULDER - 2+ VIEW COMPARISON:  None. FINDINGS: No evidence for an acute fracture. No evidence for shoulder separation or dislocation. Degenerative changes are seen in the glenohumeral joint and the acromioclavicular joint. Degenerative spurring is evident at the rotator cuff insertion. IMPRESSION: Degenerative changes without acute bony abnormality. Electronically Signed   By: Kennith Center M.D.   On: 03/25/2018 17:41    Procedures Procedures (including critical care time)  Medications Ordered in ED Medications  ipratropium-albuterol (DUONEB) 0.5-2.5 (3) MG/3ML nebulizer solution 3 mL (3 mLs Nebulization Given 03/25/18 1721)  dexamethasone (DECADRON) tablet 10 mg (10 mg Oral Given 03/25/18 1729)  AEROCHAMBER PLUS FLO-VU MEDIUM MISC 1 each (1 each Other Given 03/25/18 1829)    Initial Impression / Assessment and Plan / ED Course  I have reviewed the triage vital signs and the nursing notes.  Pertinent labs & imaging results that were available during my care of the patient were reviewed by me and considered in my medical decision making (see chart for details).   78 year old female with history of COPD who presents today with 2-week history of worsening shortness of breath, productive cough.  Lungs with decreased breath sounds in lower bases and inspiratory and expiratory wheezing.  Will give Decadron 10 mg orally x1 and DuoNeb treatment.  Will obtain CXR to rule out pneumonia.  Patient also complaining of left shoulder plane for the past 2  weeks.  Does have remote history of fall.  Will order x-ray to rule out fracture as cause although likely due to adhesive capsulitis with patient's history of diabetes and exam findings.  CXR with no findings of acute process.  Patient improved after receiving DuoNeb treatment.  Will treat as a COPD exacerbation given increased cough with sputum production.  Will treat with azithromycin for 5  days (noted erythromycin allergy in chart however patient has been prescribed azithromycin since then with no noted concerns).  We will also treat with 4 more days of prednisone 40 mg.   Patient to follow-up with her regular doctor as needed.  Given strict return precautions and patient voiced understanding.  Provided spacer in office order to help with albuterol treatments.  Patient given information to contact Dr. Norton Blizzard who is in sports medicine in order to help with patient's left shoulder pain, as degenerative changes was what was seen on x-ray.  [place] Marine Lezotte, DO PGY-2, [facility]   Final Clinical Impressions(s) / ED Diagnoses   Final diagnoses:  COPD exacerbation (HCC)  Acute pain of left shoulder    ED Discharge Orders         Ordered    azithromycin (ZITHROMAX) 250 MG tablet     03/25/18 1757    predniSONE (DELTASONE) 20 MG tablet  Daily with breakfast     03/25/18 1757           Haydn Hutsell, [place], DO 03/25/18 1837    Little, Ambrose [place], MD 03/26/18 [phone]

## 2018-03-25 NOTE — ED Notes (Signed)
Patient transported to X-ray 

## 2018-03-27 DIAGNOSIS — Z1231 Encounter for screening mammogram for malignant neoplasm of breast: Secondary | ICD-10-CM | POA: Diagnosis not present

## 2018-03-28 DIAGNOSIS — M79671 Pain in right foot: Secondary | ICD-10-CM | POA: Diagnosis not present

## 2018-03-28 DIAGNOSIS — B351 Tinea unguium: Secondary | ICD-10-CM | POA: Diagnosis not present

## 2018-03-28 DIAGNOSIS — M79672 Pain in left foot: Secondary | ICD-10-CM | POA: Diagnosis not present

## 2018-03-28 DIAGNOSIS — E119 Type 2 diabetes mellitus without complications: Secondary | ICD-10-CM | POA: Diagnosis not present

## 2018-03-28 DIAGNOSIS — L84 Corns and callosities: Secondary | ICD-10-CM | POA: Diagnosis not present

## 2018-04-01 IMAGING — DX DG CHEST 2V
2 series · 2 of 2 positions shown · non-contrast
Comparison: 03/17/2017

CLINICAL DATA: Chest discomfort, nausea, headache, cough, and
congestion for 2 days.

EXAM:
CHEST  2 VIEW

[chest pa]
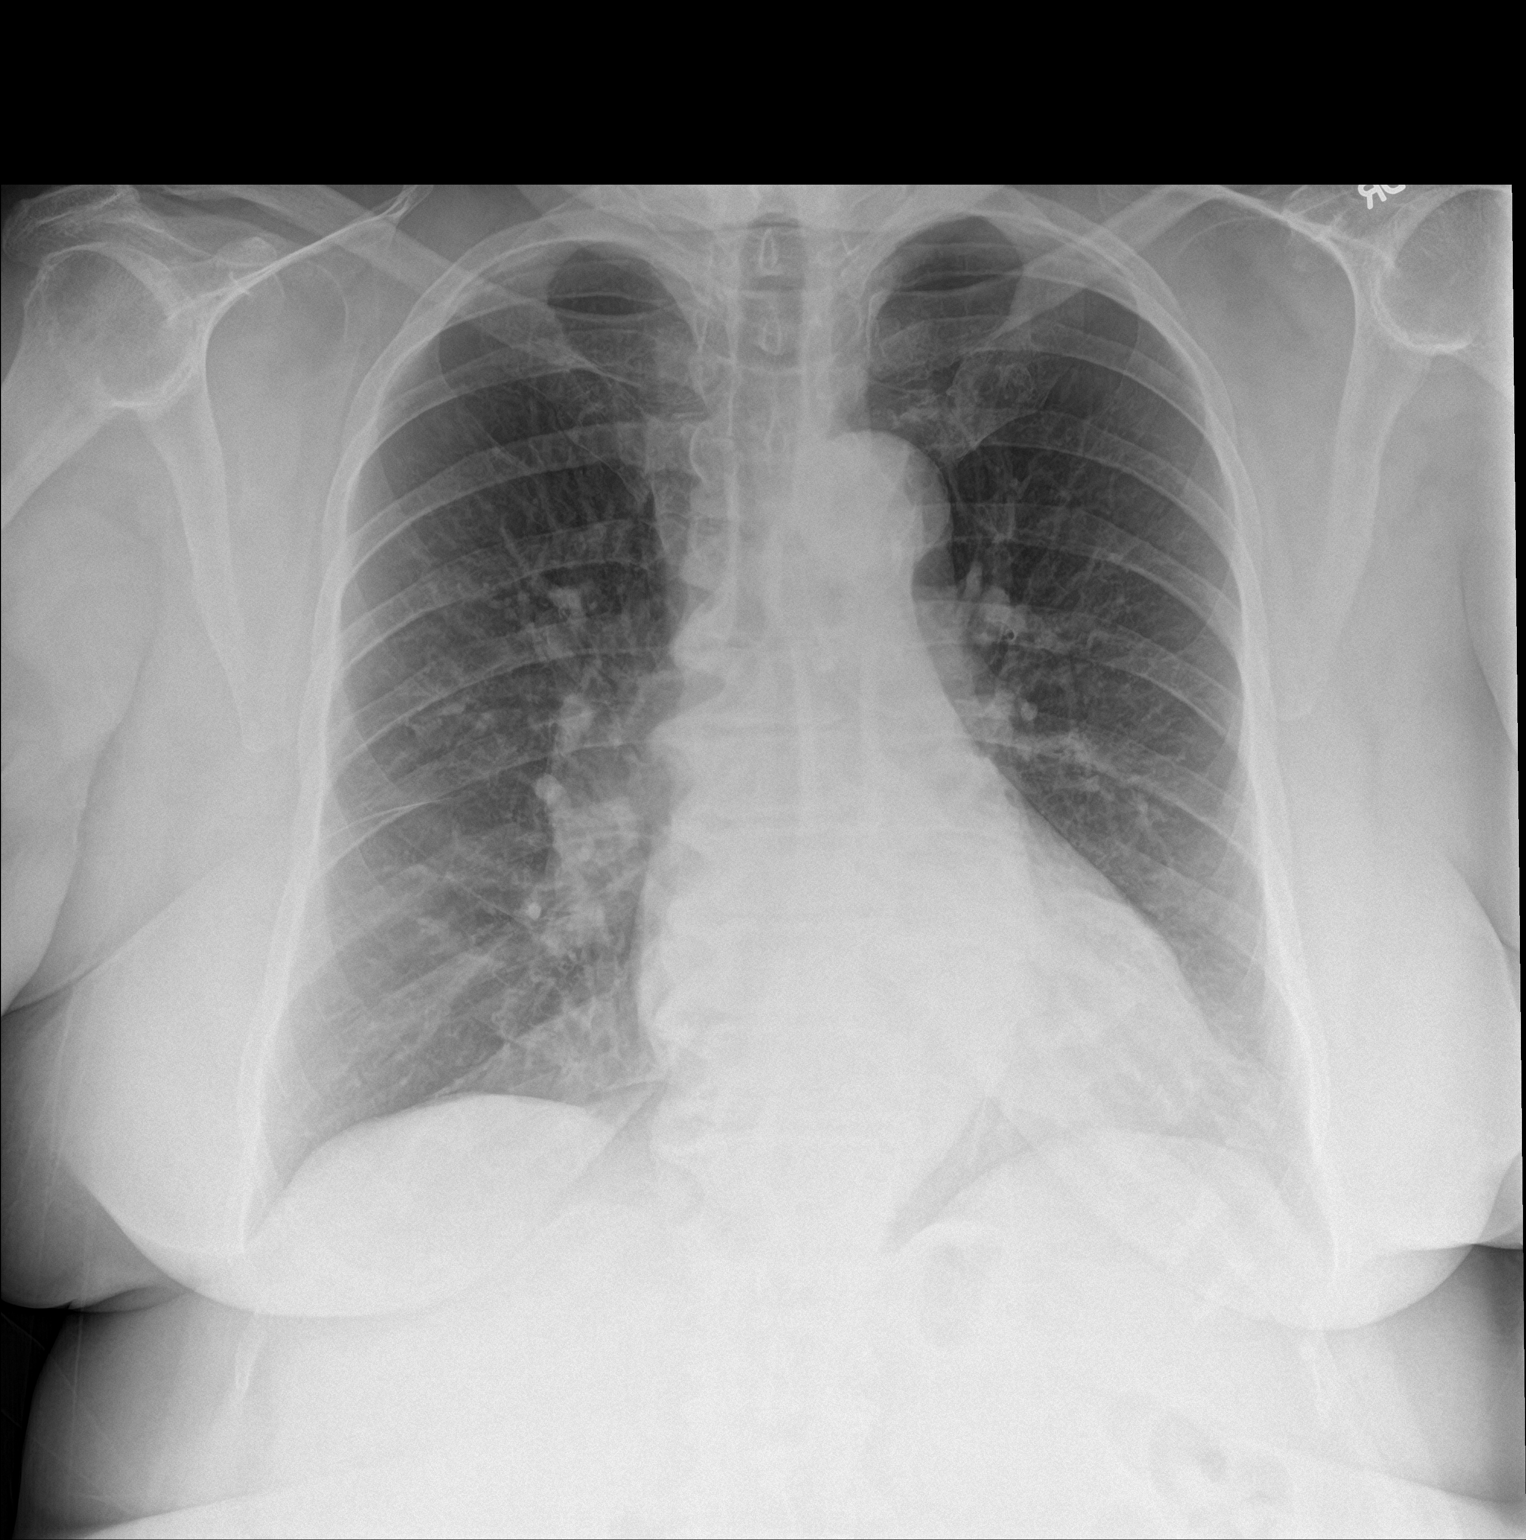

[chest lat]
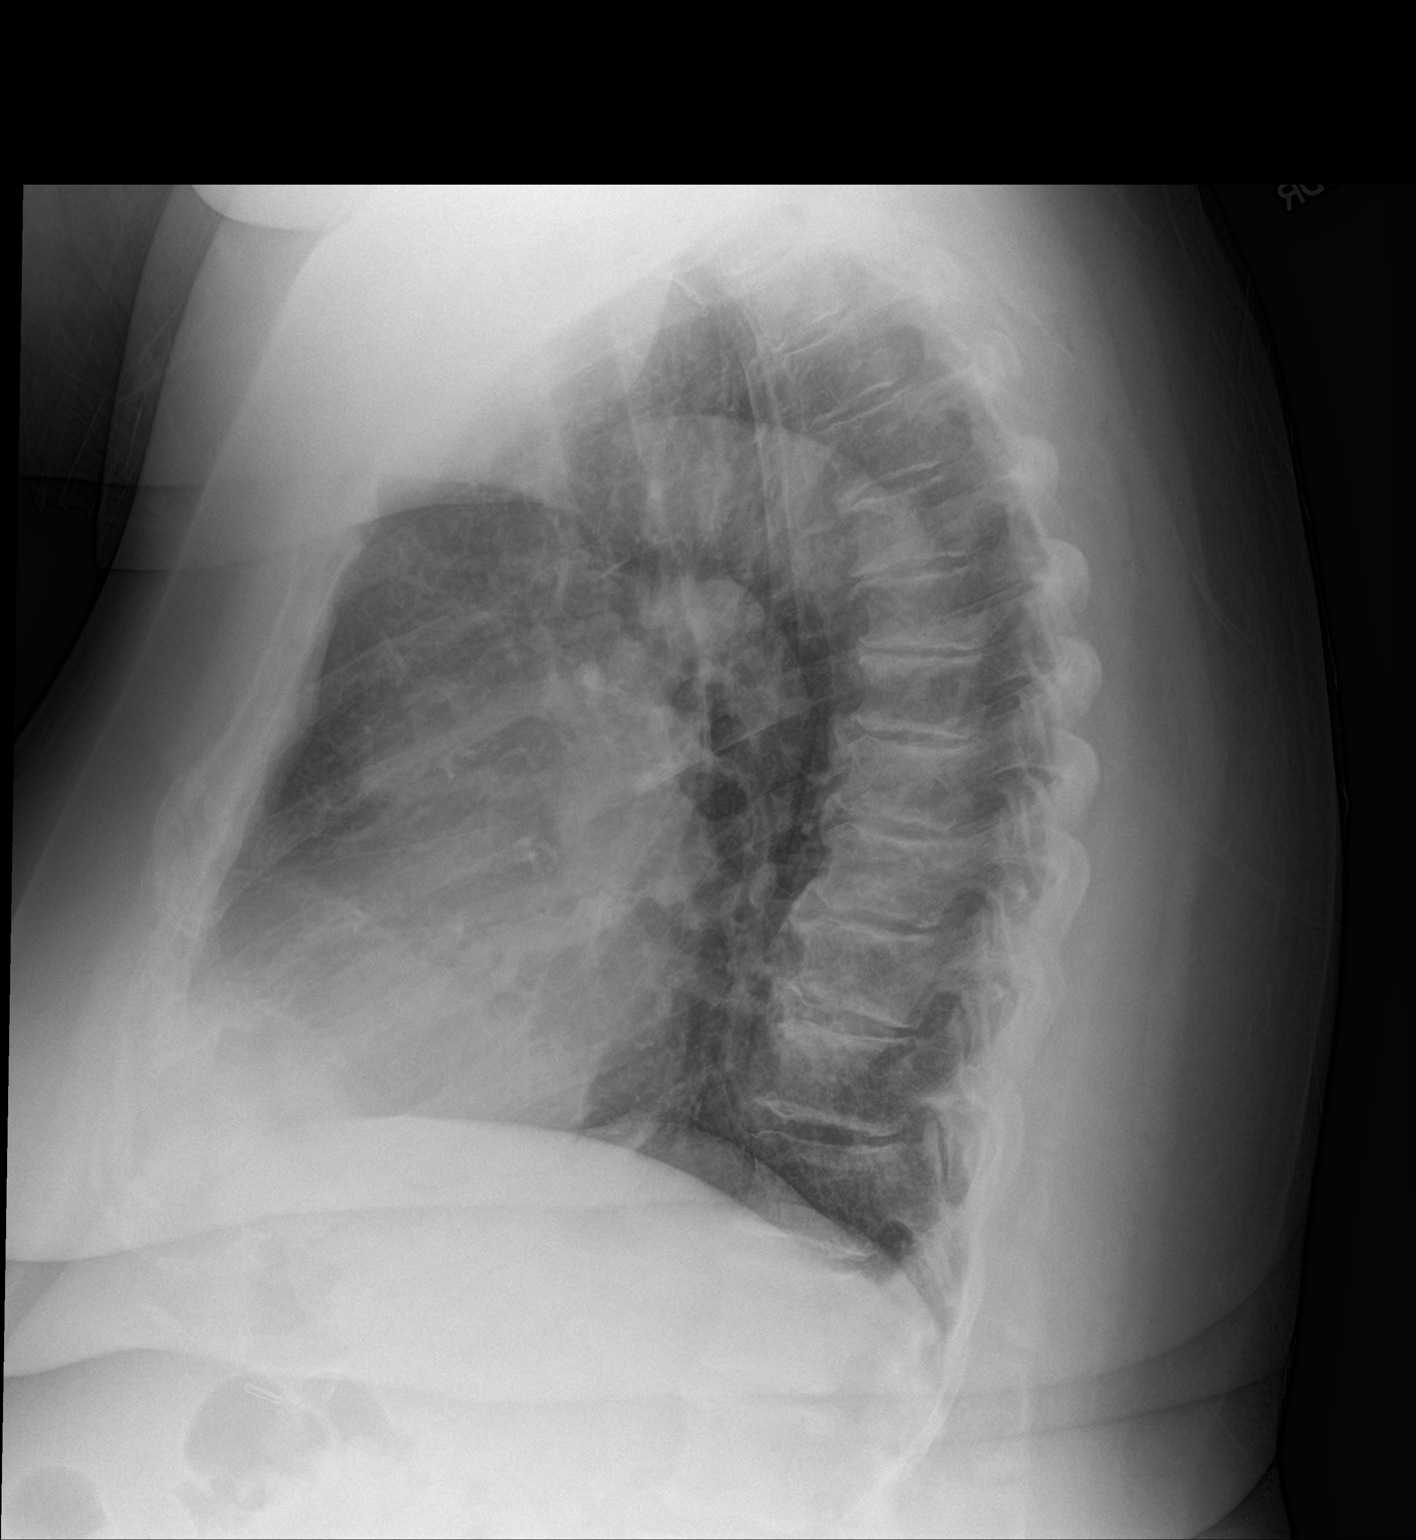

[2 of 2 positions shown; findings below may reference images not displayed]

FINDINGS: Normal heart size and pulmonary vascularity. No focal airspace
disease or consolidation in the lungs. No blunting of costophrenic
angles. No pneumothorax. Mediastinal contours appear intact.
Degenerative changes in the spine. Surgical clips in the upper
abdomen.
IMPRESSION: No active cardiopulmonary disease.

## 2018-04-07 DIAGNOSIS — R002 Palpitations: Secondary | ICD-10-CM | POA: Diagnosis not present

## 2018-04-07 DIAGNOSIS — I5033 Acute on chronic diastolic (congestive) heart failure: Secondary | ICD-10-CM | POA: Diagnosis not present

## 2018-04-07 DIAGNOSIS — I2510 Atherosclerotic heart disease of native coronary artery without angina pectoris: Secondary | ICD-10-CM | POA: Diagnosis not present

## 2018-04-07 DIAGNOSIS — Z9861 Coronary angioplasty status: Secondary | ICD-10-CM | POA: Diagnosis not present

## 2018-04-07 DIAGNOSIS — I491 Atrial premature depolarization: Secondary | ICD-10-CM | POA: Diagnosis not present

## 2018-04-09 DIAGNOSIS — R002 Palpitations: Secondary | ICD-10-CM | POA: Diagnosis not present

## 2018-04-12 DIAGNOSIS — I491 Atrial premature depolarization: Secondary | ICD-10-CM | POA: Diagnosis not present

## 2018-04-12 DIAGNOSIS — I5033 Acute on chronic diastolic (congestive) heart failure: Secondary | ICD-10-CM | POA: Diagnosis not present

## 2018-04-17 DIAGNOSIS — J45909 Unspecified asthma, uncomplicated: Secondary | ICD-10-CM | POA: Diagnosis not present

## 2018-04-18 ENCOUNTER — Other Ambulatory Visit: Payer: Self-pay | Admitting: Cardiology

## 2018-04-18 DIAGNOSIS — R0609 Other forms of dyspnea: Secondary | ICD-10-CM | POA: Diagnosis not present

## 2018-04-21 LAB — BASIC METABOLIC PANEL
BUN/Creatinine Ratio: 13 (ref 12–28)
BUN: 11 mg/dL (ref 8–27)
CALCIUM: 10.1 mg/dL (ref 8.7–10.3)
CHLORIDE: 102 mmol/L (ref 96–106)
CO2: 28 mmol/L (ref 20–29)
Creatinine, Ser: 0.83 mg/dL (ref 0.57–1.00)
GFR calc Af Amer: 79 mL/min/{1.73_m2} (ref >59)
GFR calc non Af Amer: 69 mL/min/{1.73_m2} (ref >59)
Glucose: 106 mg/dL — ABNORMAL HIGH (ref 65–99)
POTASSIUM: 3.7 mmol/L (ref 3.5–5.2)
Sodium: 147 mmol/L — ABNORMAL HIGH (ref 134–144)

## 2018-04-21 LAB — BRAIN NATRIURETIC PEPTIDE: BNP: 32.8 pg/mL (ref 0.0–100.0)

## 2018-04-25 ENCOUNTER — Other Ambulatory Visit: Payer: Self-pay

## 2018-04-25 ENCOUNTER — Emergency Department (HOSPITAL_BASED_OUTPATIENT_CLINIC_OR_DEPARTMENT_OTHER)
Admission: EM | Admit: 2018-04-25 | Discharge: 2018-04-25 | Disposition: A | Discharge status: Home / Self Care | Payer: Medicare Other | Attending: Emergency Medicine | Admitting: Emergency Medicine

## 2018-04-25 ENCOUNTER — Encounter (HOSPITAL_BASED_OUTPATIENT_CLINIC_OR_DEPARTMENT_OTHER): Payer: Self-pay | Admitting: *Deleted

## 2018-04-25 ENCOUNTER — Emergency Department (HOSPITAL_BASED_OUTPATIENT_CLINIC_OR_DEPARTMENT_OTHER): Payer: Medicare Other

## 2018-04-25 DIAGNOSIS — J069 Acute upper respiratory infection, unspecified: Secondary | ICD-10-CM | POA: Diagnosis not present

## 2018-04-25 DIAGNOSIS — I2510 Atherosclerotic heart disease of native coronary artery without angina pectoris: Secondary | ICD-10-CM | POA: Insufficient documentation

## 2018-04-25 DIAGNOSIS — I10 Essential (primary) hypertension: Secondary | ICD-10-CM | POA: Insufficient documentation

## 2018-04-25 DIAGNOSIS — Z7984 Long term (current) use of oral hypoglycemic drugs: Secondary | ICD-10-CM | POA: Diagnosis not present

## 2018-04-25 DIAGNOSIS — Z7982 Long term (current) use of aspirin: Secondary | ICD-10-CM | POA: Insufficient documentation

## 2018-04-25 DIAGNOSIS — J0110 Acute frontal sinusitis, unspecified: Secondary | ICD-10-CM | POA: Diagnosis not present

## 2018-04-25 DIAGNOSIS — Z79899 Other long term (current) drug therapy: Secondary | ICD-10-CM | POA: Insufficient documentation

## 2018-04-25 DIAGNOSIS — R002 Palpitations: Secondary | ICD-10-CM | POA: Diagnosis not present

## 2018-04-25 DIAGNOSIS — E119 Type 2 diabetes mellitus without complications: Secondary | ICD-10-CM | POA: Diagnosis not present

## 2018-04-25 DIAGNOSIS — R05 Cough: Secondary | ICD-10-CM | POA: Diagnosis not present

## 2018-04-25 DIAGNOSIS — R0602 Shortness of breath: Secondary | ICD-10-CM | POA: Insufficient documentation

## 2018-04-25 DIAGNOSIS — R5383 Other fatigue: Secondary | ICD-10-CM | POA: Insufficient documentation

## 2018-04-25 DIAGNOSIS — J449 Chronic obstructive pulmonary disease, unspecified: Secondary | ICD-10-CM | POA: Insufficient documentation

## 2018-04-25 LAB — CBC WITH DIFFERENTIAL/PLATELET
Abs Immature Granulocytes: 0.02 10*3/uL (ref 0.00–0.07)
BASOS ABS: 0 10*3/uL (ref 0.0–0.1)
BASOS PCT: 0 %
EOS ABS: 0.1 10*3/uL (ref 0.0–0.5)
Eosinophils Relative: 1 %
HEMATOCRIT: 39.4 % (ref 36.0–46.0)
Hemoglobin: 12.1 g/dL (ref 12.0–15.0)
IMMATURE GRANULOCYTES: 0 %
LYMPHS ABS: 4.3 10*3/uL — AB (ref 0.7–4.0)
Lymphocytes Relative: 41 %
MCH: 25.9 pg — ABNORMAL LOW (ref 26.0–34.0)
MCHC: 30.7 g/dL (ref 30.0–36.0)
MCV: 84.2 fL (ref 80.0–100.0)
MONO ABS: 0.6 10*3/uL (ref 0.1–1.0)
MONOS PCT: 6 %
Neutro Abs: 5.6 10*3/uL (ref 1.7–7.7)
Neutrophils Relative %: 52 %
PLATELETS: 345 10*3/uL (ref 150–400)
RBC: 4.68 MIL/uL (ref 3.87–5.11)
RDW: 13.8 % (ref 11.5–15.5)
WBC: 10.6 10*3/uL — ABNORMAL HIGH (ref 4.0–10.5)
nRBC: 0 % (ref 0.0–0.2)

## 2018-04-25 LAB — COMPREHENSIVE METABOLIC PANEL
ALBUMIN: 3.9 g/dL (ref 3.5–5.0)
ALT: 23 U/L (ref 0–44)
ANION GAP: 9 (ref 5–15)
AST: 19 U/L (ref 15–41)
Alkaline Phosphatase: 43 U/L (ref 38–126)
BILIRUBIN TOTAL: 0.9 mg/dL (ref 0.3–1.2)
BUN: 16 mg/dL (ref 8–23)
CHLORIDE: 104 mmol/L (ref 98–111)
CO2: 28 mmol/L (ref 22–32)
Calcium: 9.5 mg/dL (ref 8.9–10.3)
Creatinine, Ser: 0.58 mg/dL (ref 0.44–1.00)
GFR calc Af Amer: >60 mL/min (ref >60)
GFR calc non Af Amer: >60 mL/min (ref >60)
Glucose, Bld: 74 mg/dL (ref 70–99)
POTASSIUM: 3.4 mmol/L — AB (ref 3.5–5.1)
Sodium: 141 mmol/L (ref 135–145)
TOTAL PROTEIN: 7.1 g/dL (ref 6.5–8.1)

## 2018-04-25 LAB — BRAIN NATRIURETIC PEPTIDE: B NATRIURETIC PEPTIDE 5: 51.9 pg/mL (ref 0.0–100.0)

## 2018-04-25 LAB — URINALYSIS, ROUTINE W REFLEX MICROSCOPIC
Bilirubin Urine: NEGATIVE
Glucose, UA: NEGATIVE mg/dL
Hgb urine dipstick: NEGATIVE
KETONES UR: NEGATIVE mg/dL
LEUKOCYTE UA: NEGATIVE
NITRITE: NEGATIVE
PROTEIN: NEGATIVE mg/dL
Specific Gravity, Urine: <1.005 — ABNORMAL LOW (ref 1.005–1.030)
pH: 6 (ref 5.0–8.0)

## 2018-04-25 MED ORDER — AMOXICILLIN-POT CLAVULANATE 875-125 MG PO TABS: 1.0000 | ORAL_TABLET | Freq: Two times a day (BID) | ORAL | 0 refills | Status: AC

## 2018-04-25 MED ORDER — POTASSIUM CHLORIDE CRYS ER 20 MEQ PO TBCR
40.0000 meq | EXTENDED_RELEASE_TABLET | Freq: Once | ORAL | Status: AC
  Administered 2018-04-25: 40 meq via ORAL
  Filled 2018-04-25: qty 2

## 2018-04-25 NOTE — ED Provider Notes (Signed)
[facility] EMERGENCY DEPARTMENT Provider Note   CSN: [id number] Arrival date & time: 04/25/18  1413     History   Chief Complaint Chief Complaint  Patient presents with  . URI    HPI Angie Hill is a 78 y.o. female.  HPI   Presents with fatigue, low appetite, postnasal drip, cough, feels badly. When she tries to move, has left sided back/flank pain, but then having right sided pain too.  Body aches for at least a few weeks.   Heart palpitations, racing, last 15-20 minutes, will have chest pain with episodes, takes metoprolol and they get better, has been going on for about week. Stopped nebivolol, put on metoprolol about one week ago.   Reports feeling sick for the last week but worse last night.  Taken over the counter medications for cough and flu with no relief. Feels short of breath too, sometimes, will use inhaler and it helps. No increased orthopnea. No leg swelling. Watches fluid intake. No significant increase in dyspnea recently.    Just overall feeling sick with cough, fatigue   Past Medical History:  Diagnosis Date  . Anemia   . Angina pectoris (HCC)   . Arthritis   . Asthma   . COPD (chronic obstructive pulmonary disease) (HCC)   . Coronary artery disease   . Depression   . Diabetes mellitus without complication (HCC)    BORDERLINE  . GERD (gastroesophageal reflux disease)   . H/O hiatal hernia   . Hypertension   . Shortness of breath   . Sleep apnea     Patient Active Problem List   Diagnosis Date Noted  . Epigastric pain   . Chest pain, musculoskeletal 12/03/2015  . 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 Onychomycosis 09/01/2012  . Pain in joint, ankle and foot 09/01/2012  . Dermatitis 09/01/2012  . Hereditary and idiopathic peripheral neuropathy 09/01/2012  . S/P PTCA (percutaneous transluminal coronary angioplasty) 07/05/2012  . Essential hypertension 07/05/2012  . Hyperlipidemia 07/05/2012    Past  Surgical History:  Procedure Laterality Date  . ABDOMINAL HYSTERECTOMY     partial  . CORONARY ANGIOPLASTY WITH STENT PLACEMENT  07/04/2012   mid LAD  . LEFT AND RIGHT HEART CATHETERIZATION WITH CORONARY ANGIOGRAM N/A 07/04/2012   Procedure: LEFT AND RIGHT HEART CATHETERIZATION WITH CORONARY ANGIOGRAM;  Surgeon: Pamella PertJagadeesh R Ganji, MD;  [facility];  Service: Cardiovascular;  Laterality: N/A;  . LEFT HEART CATHETERIZATION WITH CORONARY ANGIOGRAM N/A 10/17/2012   Procedure: LEFT HEART CATHETERIZATION WITH CORONARY ANGIOGRAM;  Surgeon: Pamella PertJagadeesh R Ganji, MD;  [facility];  Service: Cardiovascular;  Laterality: N/A;  . PARTIAL COLECTOMY  1982  . PERCUTANEOUS CORONARY STENT INTERVENTION (PCI-S)  07/04/2012   Procedure: PERCUTANEOUS CORONARY STENT INTERVENTION (PCI-S);  Surgeon: Pamella PertJagadeesh R Ganji, MD;  Location[facility];  Service: Cardiovascular;;  . TUBAL LIGATION       OB History   No obstetric history on file.      Home Medications    Prior to Admission medications   Medication Sig Start Date End Date Taking? Authorizing Provider  albuterol (PROVENTIL HFA;VENTOLIN HFA) 108 (90 Base) MCG/ACT inhaler Inhale 2 puffs into the lungs 2 (two) times daily as needed for wheezing or shortness of breath. 04/17/17   Horton, Mayer Maskerourtney F, MD  allopurinol (ZYLOPRIM) 300 MG tablet Take 300 mg by mouth daily.    [provider]  amLODipine-valsartan (EXFORGE) 10-320 MG per tablet Take 1  tablet by mouth every morning.    [provider]  amoxicillin (AMOXIL) 500 MG capsule Take 1 capsule (500 mg total) by mouth 3 (three) times daily. Patient not taking: Reported on 07/13/2017 03/17/17   Rolland PorterJames, Mark, MD  amoxicillin-clavulanate (AUGMENTIN) 875-125 MG tablet Take 1 tablet by mouth every 12 (twelve) hours for 7 days. 04/25/18 05/02/18  Alvira MondaySchlossman, Neveyah Garzon, MD  aspirin EC 81 MG tablet Take 81 mg by mouth daily.    [provider]  atorvastatin (LIPITOR) 80 MG tablet Take 80  mg by mouth daily.    [provider]  azithromycin (ZITHROMAX) 250 MG tablet Take 2 tabs day 1, then 1 tab daily 03/25/18   Shirley, [place], DO  b complex vitamins tablet Take 1 tablet by mouth daily.    [provider]  benzonatate (TESSALON) 100 MG capsule Take 1 capsule (100 mg total) by mouth every 8 (eight) hours. 09/17/17   Maczis, Elmer SowMichael M, PA-C  budesonide-formoterol (SYMBICORT) 160-4.5 MCG/ACT inhaler Inhale 2 puffs into the lungs daily.    [provider]  Calcium Carbonate-Vitamin D (CALCIUM + D PO) Take 1 tablet by mouth daily.    [provider]  clobetasol cream (TEMOVATE) 0.05 % Apply 1 application topically daily. Apply to rash.    [provider]  Cyanocobalamin (VITAMIN B-12 PO) Take 1 tablet by mouth daily.     [provider]  doxycycline (VIBRAMYCIN) 100 MG capsule Take 1 capsule (100 mg total) by mouth 2 (two) times daily. 09/17/17   Maczis, Elmer SowMichael M, PA-C  EPINEPHrine (EPI-PEN) 0.3 mg/0.3 mL SOAJ Inject 0.3 mg into the muscle once as needed (for allergic reaction to pollen).    [provider]  gabapentin (NEURONTIN) 300 MG capsule Take 300 mg by mouth at bedtime.    [provider]  glipiZIDE (GLUCOTROL) 5 MG tablet Take by mouth daily before breakfast.    [provider]  hydrALAZINE (APRESOLINE) 50 MG tablet Take 50 mg by mouth 2 (two) times daily.     [provider]  hydrochlorothiazide (HYDRODIURIL) 25 MG tablet Take 1 tablet (25 mg total) by mouth daily. 12/04/15   Rodolph Bonghompson, Daniel V, MD  HYDROcodone-acetaminophen (NORCO/VICODIN) 5-325 MG per tablet Take 1 tablet by mouth every 6 (six) hours as needed for pain.    [provider]  HYDROcodone-acetaminophen (NORCO/VICODIN) 5-325 MG tablet Take 1-2 tablets by mouth every 6 (six) hours as needed for moderate pain. 05/14/16   Cathren LaineSteinl, Kevin, MD  hydrOXYzine (VISTARIL) 50 MG capsule Take 50 mg by mouth 4 (four) times daily as needed  for itching.    [provider]  meloxicam (MOBIC) 7.5 MG tablet Take 7.5 mg by mouth daily.    [provider]  mirtazapine (REMERON) 15 MG tablet Take 15 mg by mouth at bedtime as needed (for sleep).    [provider]  nebivolol 20 MG TABS Take 1 tablet (20 mg total) by mouth daily. 12/05/15   Rodolph Bonghompson, Daniel V, MD  nitroGLYCERIN (NITROSTAT) 0.4 MG SL tablet Place 1 tablet (0.4 mg total) under the tongue every 5 (five) minutes as needed for chest pain. 12/04/15   Rodolph Bonghompson, Daniel V, MD  ondansetron (ZOFRAN-ODT) 4 MG disintegrating tablet Take 1 tablet (4 mg total) by mouth every 8 (eight) hours as needed for nausea or vomiting. 06/30/16   Benjiman CorePickering, Nathan, MD  pantoprazole (PROTONIX) 40 MG tablet Take 40 mg by mouth daily.     [provider]  polyethylene glycol (  MIRALAX) packet Take 17 g by mouth daily. 06/30/16   Benjiman Core, MD  Polyvinyl Alcohol-Povidone (REFRESH OP) Place 1 drop into both eyes daily as needed (for dry eyes).    [provider]  promethazine (PHENERGAN) 25 MG tablet Take 25 mg by mouth every 6 (six) hours as needed for nausea.    [provider]  tiotropium (SPIRIVA) 18 MCG inhalation capsule Place 18 mcg into inhaler and inhale daily.    [provider]  traMADol (ULTRAM) 50 MG tablet Take 1 tablet (50 mg total) by mouth every 6 (six) hours as needed. Take 1 tablet 3 times daily x 5 days, then every 6 hours as needed. 12/04/15   Rodolph Bong, MD    Family History Family History  Problem Relation Age of Onset  . Hyperlipidemia Sister   . Heart attack Sister   . Diabetes Daughter   . Stroke Father   . Heart attack Father     Social History Social History   Tobacco Use  . Smoking status: Never Smoker  . Smokeless tobacco: Never Used  Substance Use Topics  . Alcohol use: No  . Drug use: No     Allergies   Ciprofloxacin and Erythromycin   Review of Systems Review of Systems    Constitutional: Positive for chills, fatigue and fever.  HENT: Positive for congestion. Negative for sore throat.   Eyes: Negative for visual disturbance.  Respiratory: Positive for cough and shortness of breath.   Cardiovascular: Negative for chest pain.  Gastrointestinal: Positive for nausea (with postnasal drip). Negative for abdominal pain, diarrhea and vomiting.  Genitourinary: Negative for difficulty urinating.  Musculoskeletal: Positive for neck pain. Negative for back pain.  Skin: Negative for rash.  Neurological: Positive for headaches. Negative for syncope.     Physical Exam Updated Vital Signs BP (!) 149/84 (BP Location: Left Arm)   Pulse 88   Temp 98.1 F (36.7 C) (Oral)   Resp 20   Ht 5\' 8"  (1.727 m)   Wt 119.7 kg   SpO2 96%   BMI 40.14 kg/m   Physical Exam Vitals signs and nursing note reviewed.  Constitutional:      General: She is not in acute distress.    Appearance: She is well-developed. She is not diaphoretic.  HENT:     Head: Normocephalic and atraumatic.     Comments: Sinus tenderness Eyes:     Conjunctiva/sclera: Conjunctivae normal.  Neck:     Musculoskeletal: Normal range of motion.  Cardiovascular:     Rate and Rhythm: Normal rate and regular rhythm.     Heart sounds: Normal heart sounds. No murmur. No friction rub. No gallop.   Pulmonary:     Effort: Pulmonary effort is normal. No respiratory distress.     Breath sounds: Normal breath sounds. No wheezing or rales.  Abdominal:     General: There is no distension.     Palpations: Abdomen is soft.     Tenderness: There is no abdominal tenderness. There is no guarding.  Musculoskeletal:        General: No tenderness.  Skin:    General: Skin is warm and dry.     Findings: No erythema or rash.  Neurological:     Mental Status: She is alert and oriented to person, place, and time.      ED Treatments / Results  Labs (all labs ordered are listed, but only abnormal results are  displayed) Labs Reviewed  CBC WITH DIFFERENTIAL/PLATELET -  Abnormal; Notable for the following components:      Result Value   WBC 10.6 (*)    MCH 25.9 (*)    Lymphs Abs 4.3 (*)    All other components within normal limits  COMPREHENSIVE METABOLIC PANEL - Abnormal; Notable for the following components:   Potassium 3.4 (*)    All other components within normal limits  URINALYSIS, ROUTINE W REFLEX MICROSCOPIC - Abnormal; Notable for the following components:   APPearance HAZY (*)    Specific Gravity, Urine <1.005 (*)    All other components within normal limits  URINE CULTURE  BRAIN NATRIURETIC PEPTIDE    EKG EKG Interpretation  Date/Time:  Tuesday April 25 2018 16:22:29 EST Ventricular Rate:  83 PR Interval:    QRS Duration: 76 QT Interval:  389 QTC Calculation: 458 R Axis:   40 Text Interpretation:  Sinus rhythm Atrial premature complex Prolonged PR interval Consider left ventricular hypertrophy Borderline T abnormalities, anterior leads Since prior ECG, Atrial premature complex present, no other significant abnormalities. Confirmed by Alvira Monday ([id number]) on 04/25/2018 5:11:46 PM Also confirmed by Alvira Monday ([id number]), editor Barbette Hair [phone])  on 04/26/2018 6:58:27 AM   Radiology Dg Chest 2 View  Result Date: 04/25/2018 CLINICAL DATA:  Pt having cough,fever and congestion for a week,nonsmoker EXAM: CHEST - 2 VIEW COMPARISON:  03/25/2018 FINDINGS: Lungs are clear. Heart size normal. Prominent central pulmonary arteries. Coronary calcifications. No effusion. Anterior vertebral endplate spurring at multiple levels in the mid and lower thoracic spine. Bilateral shoulder DJD. IMPRESSION: No acute cardiopulmonary disease. Electronically Signed   By: Corlis Leak M.D.   On: 04/25/2018 15:53    Procedures Procedures (including critical care time)  Medications Ordered in ED Medications  potassium chloride SA (K-DUR,KLOR-CON) CR tablet 40 mEq (40 mEq Oral Given 04/25/18  1726)     Initial Impression / Assessment and Plan / ED Course  I have reviewed the triage vital signs and the nursing notes.  Pertinent labs & imaging results that were available during my care of the patient were reviewed by me and considered in my medical decision making (see chart for details).     78yo female with history above presents with concern for cough, congestion, headache, chills, fatigue for one week.  Also reports palpitations, ECG and telemetry without acute abnormalities.  CXR shows no sign of pneumonia or pulmonary edema. No anemia, no significant electrolyte abnormalities. Mild hypokalemia. BNP normal. UA without infection.    Suspect likely viral infection, influenza possible however out of window for tamiflu. Has sinus pressure, symptoms for over one week, concerning for bacterial sinusitis. Given rx for abx. Patient discharged in stable condition with understanding of reasons to return.   Final Clinical Impressions(s) / ED Diagnoses   Final diagnoses:  Upper respiratory tract infection, unspecified type  Acute frontal sinusitis, recurrence not specified  Other fatigue  Palpitations    ED Discharge Orders         Ordered    amoxicillin-clavulanate (AUGMENTIN) 875-125 MG tablet  Every 12 hours     04/25/18 1714           Alvira Monday, MD 04/27/18 1048

## 2018-04-25 NOTE — ED Notes (Signed)
ED Provider at bedside. 

## 2018-04-25 NOTE — ED Triage Notes (Signed)
Sore throat, cough, headache, chills, body aches and nasal congestion x 1 week.

## 2018-04-26 LAB — URINE CULTURE: Culture: NO GROWTH

## 2018-05-11 DIAGNOSIS — J40 Bronchitis, not specified as acute or chronic: Secondary | ICD-10-CM | POA: Diagnosis not present

## 2018-05-11 DIAGNOSIS — E785 Hyperlipidemia, unspecified: Secondary | ICD-10-CM | POA: Diagnosis not present

## 2018-05-11 DIAGNOSIS — E1151 Type 2 diabetes mellitus with diabetic peripheral angiopathy without gangrene: Secondary | ICD-10-CM | POA: Diagnosis not present

## 2018-05-11 DIAGNOSIS — E1140 Type 2 diabetes mellitus with diabetic neuropathy, unspecified: Secondary | ICD-10-CM | POA: Diagnosis not present

## 2018-05-11 DIAGNOSIS — I10 Essential (primary) hypertension: Secondary | ICD-10-CM | POA: Diagnosis not present

## 2018-05-15 ENCOUNTER — Ambulatory Visit (INDEPENDENT_AMBULATORY_CARE_PROVIDER_SITE_OTHER): Payer: Medicare Other | Admitting: Cardiology

## 2018-05-15 ENCOUNTER — Encounter: Payer: Self-pay | Admitting: Cardiology

## 2018-05-15 VITALS — BP 126/83 | HR 94 | Ht 67.0 in | Wt 268.1 lb

## 2018-05-15 DIAGNOSIS — I951 Orthostatic hypotension: Secondary | ICD-10-CM

## 2018-05-15 DIAGNOSIS — I10 Essential (primary) hypertension: Secondary | ICD-10-CM

## 2018-05-15 DIAGNOSIS — I5032 Chronic diastolic (congestive) heart failure: Secondary | ICD-10-CM

## 2018-05-15 DIAGNOSIS — R0789 Other chest pain: Secondary | ICD-10-CM

## 2018-05-15 DIAGNOSIS — I491 Atrial premature depolarization: Secondary | ICD-10-CM | POA: Diagnosis not present

## 2018-05-15 NOTE — Progress Notes (Signed)
Subjective:   Angie Hill, female    DOB: May 29, 1940, 78 y.o.   MRN: [id number]  Norm Salt, PA:  Chief Complaint  Patient presents with  . Congestive Heart Failure  . Follow-up  . Chest Pain    HPI: Angie Hill  is a 78 y.o. female  with hypertension, hyperlipidemia, diabetes, CAD S/P coronary angiogram in 2014, sleep apnea not on CPAP due to patient inability to afford, now referred to Dr. Vickey Huger, multiple simple cyst of kidneys by CT and MRI; however, did not have workup of this, underwent Lexiscan nuclear stress test in April 2019 and considered low risk. Echocardiogram was also performed and showed moderate LVH, diastolic dysfunction, moderate MR, and suggested pulmonary HTN.  Patient was last seen 1 month ago with worsening shortness of breath, left shoulder pain, and occasional chest discomfort.  She was noted to have frequent PACs on EKG.  She was scheduled for 48-hour Holter monitor and also echocardiogram and now presents discuss results.   She saw her PCP last week and states that she was taken off her metoprolol as her heart rate was too low and she had a fall recently. Reports fall occurred upon standing. Did not have LOC. She does have dizziness with sudden position changes.   She does have left sided chest pain on occasion that she describes as muscle spasms and exacerbated by certain movements. Has chronic dyspnea on exertion.   Past Medical History:  Diagnosis Date  . Anemia   . Angina pectoris (HCC)   . Arthritis   . Asthma   . COPD (chronic obstructive pulmonary disease) (HCC)   . Coronary artery disease   . Depression   . Diabetes mellitus without complication (HCC)    BORDERLINE  . GERD (gastroesophageal reflux disease)   . H/O hiatal hernia   . Hypertension   . Shortness of breath   . Sleep apnea     Past Surgical History:  Procedure Laterality Date  . ABDOMINAL HYSTERECTOMY     partial  . CORONARY ANGIOPLASTY WITH STENT  PLACEMENT  07/04/2012   mid LAD  . LEFT AND RIGHT HEART CATHETERIZATION WITH CORONARY ANGIOGRAM N/A 07/04/2012   Procedure: LEFT AND RIGHT HEART CATHETERIZATION WITH CORONARY ANGIOGRAM;  Surgeon: Pamella Pert, MD;  Location: [facility] CATH LAB;  Service: Cardiovascular;  Laterality: N/A;  . LEFT HEART CATHETERIZATION WITH CORONARY ANGIOGRAM N/A 10/17/2012   Procedure: LEFT HEART CATHETERIZATION WITH CORONARY ANGIOGRAM;  Surgeon: Pamella Pert, MD;  Location: [facility] CATH LAB;  Service: Cardiovascular;  Laterality: N/A;  . PARTIAL COLECTOMY  1982  . PERCUTANEOUS CORONARY STENT INTERVENTION (PCI-S)  07/04/2012   Procedure: PERCUTANEOUS CORONARY STENT INTERVENTION (PCI-S);  Surgeon: Pamella Pert, MD;  Location: [facility] CATH LAB;  Service: Cardiovascular;;  . TUBAL LIGATION      Family History  Problem Relation Age of Onset  . Hyperlipidemia Sister   . Heart attack Sister   . Diabetes Daughter   . Stroke Father   . Heart attack Father   . Stroke Sister     Social History   Socioeconomic History  . Marital status: Single    Spouse name: Not on file  . Number of children: 5  . Years of education: Not on file  . Highest education level: Not on file  Occupational History  . Not on file  Social Needs  . Financial resource strain: Not on file  . Food insecurity:    Worry:  Not on file    Inability: Not on file  . Transportation needs:    Medical: Not on file    Non-medical: Not on file  Tobacco Use  . Smoking status: Never Smoker  . Smokeless tobacco: Never Used  Substance and Sexual Activity  . Alcohol use: No  . Drug use: No  . Sexual activity: Not on file  Lifestyle  . Physical activity:    Days per week: Not on file    Minutes per session: Not on file  . Stress: Not on file  Relationships  . Social connections:    Talks on phone: Not on file    Gets together: Not on file    Attends religious service: Not on file    Active member of club or organization: Not on file     Attends meetings of clubs or organizations: Not on file    Relationship status: Not on file  . Intimate partner violence:    Fear of current or ex partner: Not on file    Emotionally abused: Not on file    Physically abused: Not on file    Forced sexual activity: Not on file  Other Topics Concern  . Not on file  Social History Narrative  . Not on file    Current Meds  Medication Sig  . albuterol (PROVENTIL HFA;VENTOLIN HFA) 108 (90 Base) MCG/ACT inhaler Inhale 2 puffs into the lungs 2 (two) times daily as needed for wheezing or shortness of breath.  . allopurinol (ZYLOPRIM) 300 MG tablet Take 300 mg by mouth daily.  Marland Kitchen amLODipine-valsartan (EXFORGE) 10-320 MG per tablet Take 1 tablet by mouth every morning.  Marland Kitchen aspirin EC 81 MG tablet Take 81 mg by mouth daily.  Marland Kitchen atorvastatin (LIPITOR) 80 MG tablet Take 80 mg by mouth daily.  Marland Kitchen b complex vitamins tablet Take 1 tablet by mouth daily.  . budesonide-formoterol (SYMBICORT) 160-4.5 MCG/ACT inhaler Inhale 2 puffs into the lungs daily.  . clobetasol cream (TEMOVATE) 0.05 % Apply 1 application topically daily. Apply to rash.  . Cyanocobalamin (VITAMIN B-12 PO) Take 1 tablet by mouth daily.   Marland Kitchen gabapentin (NEURONTIN) 300 MG capsule Take 300 mg by mouth 2 (two) times daily.   Marland Kitchen glipiZIDE (GLUCOTROL) 5 MG tablet Take by mouth daily before breakfast.  . HYDROcodone-acetaminophen (NORCO/VICODIN) 5-325 MG per tablet Take 1 tablet by mouth every 6 (six) hours as needed for pain.  . nitroGLYCERIN (NITROSTAT) 0.4 MG SL tablet Place 1 tablet (0.4 mg total) under the tongue every 5 (five) minutes as needed for chest pain.  Marland Kitchen ondansetron (ZOFRAN-ODT) 4 MG disintegrating tablet Take 1 tablet (4 mg total) by mouth every 8 (eight) hours as needed for nausea or vomiting.  . pantoprazole (PROTONIX) 40 MG tablet Take 40 mg by mouth daily.   . polyethylene glycol (MIRALAX) packet Take 17 g by mouth daily.  . promethazine (PHENERGAN) 25 MG tablet Take 25 mg by  mouth every 6 (six) hours as needed for nausea.     Review of Systems  Constitution: Negative for decreased appetite, malaise/fatigue, weight gain and weight loss.  Eyes: Negative for visual disturbance.  Cardiovascular: Positive for chest pain (occasional), dyspnea on exertion and palpitations. Negative for claudication, leg swelling, orthopnea and syncope.  Respiratory: Negative for hemoptysis and wheezing.   Endocrine: Negative for cold intolerance and heat intolerance.  Hematologic/Lymphatic: Does not bruise/bleed easily.  Skin: Negative for nail changes.  Musculoskeletal: Negative for muscle weakness and myalgias.  Gastrointestinal: Negative  for abdominal pain, change in bowel habit, nausea and vomiting.  Neurological: Positive for dizziness. Negative for difficulty with concentration, focal weakness and headaches.  Psychiatric/Behavioral: Negative for altered mental status and suicidal ideas.  All other systems reviewed and are negative.      Objective:     Blood pressure 126/83, pulse 94, height 5\' 7"  (1.702 m), weight 268 lb 1.6 oz (121.6 kg), SpO2 99 %.  48 hour holter monitor 04/07/2018: Normal sinus rhythm. Min HR 60 and Max HR 103. Occasional PVC (4.3% burden) and rare PAC (<1% burden). No A fib or SVT noted. No heart block.   Echocardiogram 04/12/2018: Left ventricle cavity is normal in size. Moderate concentric hypertrophy of the left ventricle. Low normal global wall motion. Visual EF is 50-55%. Doppler evidence of grade I (impaired) diastolic dysfunction, normal LAP. Calculated EF 48%. Mild (Grade I) mitral regurgitation. Mild tricuspid regurgitation. Estimated pulmonary artery systolic pressure 29 mmHg. No significant change compared to previous study on 06/15/2017.  Lexiscan myoview stress test 06/06/2017: 1. Pharmacologic stress testing was performed with intravenous administration of .4 mg of Lexiscan over a 10-15 seconds infusion. Stress symptoms included chest  tightness, dyspnea, dizziness, headache. Normal blood pressure. Exercise capacity not assessed. Stress EKG is non diagnostic for ischemia as it is a pharmacologic stress. 2. The overall quality of the study is fair. Review of the raw data in a rotational cine format reveals breast attenuation with imaging performed in sitting position. Attenuation is most pronounced on rest images. Left ventricular cavity is noted to be normal on the rest and stress studies. Gated SPECT images reveal normal myocardial thickening and wall motion. The left ventricular ejection fraction was calculated or visually estimated to be 65%. SPECT rest images demonstrate a large area of mild intensity perfusion defect in basal to apical inferior, inferoseptal and apical inferolateral myocardial walls. Stress SPECT images demonstrate only a small area of mild intensity perfusion defect in apical inferior inferolateral myocardial walls. While perfusion defect on rest images likely represent breast attenuation, perfusion defect, perfusion defect on stress images could represent small area of mild ischemia. 3. Low risk study.  Lower extremity arterial duplex 06/15/2017: No hemodynamically significant stenoses are identified in the lower extremity arterial system. This exam reveals normal perfusion of the lower extremity (ABI). Bilateral ABI 1.00 with triphasic (normal) waveform at the level of the ankle. Mid SFA bilaterally shows mildly abnormal waveform (biphasic) and may suggest mild diffuse disease.  Coronary angiogram 07/04/2012: PTCA, stenting of mid LAD with a 4.0 x 22 mm integrity non-drug-eluting stent.  Mild pulmonary hypertension.  Normal left ventricular systolic function.  60-70% mid RCA stenosis.  Physical Exam  Constitutional: She is oriented to person, place, and time. Vital signs are normal. She appears well-developed and well-nourished.  HENT:  Head: Normocephalic and atraumatic.  Neck: Trachea normal and  normal range of motion.  Cardiovascular: Normal rate, regular rhythm, normal heart sounds and intact distal pulses.  Pulses:      Femoral pulses are 1+ on the right side and 1+ on the left side.      Popliteal pulses are 1+ on the right side and 1+ on the left side.       Dorsalis pedis pulses are 2+ on the right side and 2+ on the left side.       Posterior tibial pulses are 2+ on the right side and 2+ on the left side.  Left-2+ pitting edema Right-1+ pitting edema  Pulmonary/Chest: Effort normal and  breath sounds normal. No accessory muscle usage. No respiratory distress. She exhibits tenderness.    Left anterior wall tenderness  Abdominal: Soft. Bowel sounds are normal.  Musculoskeletal: Normal range of motion.  Neurological: She is alert and oriented to person, place, and time.  Skin: Skin is warm and dry.  Vitals reviewed.          Assessment & Recommendations:   1. Essential hypertension Well controlled on current therapy.   2. PAC (premature atrial contraction) Noted to have 4.3% PAC burden. Metoprolol was discontinued by PCP for bradycardia. She was not noted to have bradycardia on monitor.  She is not noted to have PAC's on EKG today. If needed can resume Metoprolol and will consider at her next follow up. Has only borderline first degree AV block.   3. Orthostatic hypotension Suspect her recent symptoms of dizziness and fall upon standing are related to this. Borderline orthostatic in our office. Possibly secondary to diabetes or autonomic dysfunction. Patient was measured for support stockings in our office. Discussed slow position changes. Clonidine may also be an option if she begins having elevated blood pressure.  4. Musculoskeletal chest pain Has left sided chest wall pain on exam. Not associated with exertion. Discussed using heat/ice to her chest and as needed tylenol or ibuprofen.  5. Chronic heart failure with preserved ejection fraction (HCC) I have  discussed recently obtained echo results, no changes compared to previous echo. No clinical evidence of heart failure. Suspect her symptoms of shortness of breath are secondary to COPD and deconditioning.   I will see her back in 3-4 weeks for follow up on orthostatic hypotension and PAC's   Altamese Edgefield, FNP-C [facility] Office: [phone] Fax: [phone]

## 2018-05-16 DIAGNOSIS — I951 Orthostatic hypotension: Secondary | ICD-10-CM | POA: Insufficient documentation

## 2018-05-16 DIAGNOSIS — I5032 Chronic diastolic (congestive) heart failure: Secondary | ICD-10-CM

## 2018-05-16 DIAGNOSIS — I491 Atrial premature depolarization: Secondary | ICD-10-CM

## 2018-05-16 DIAGNOSIS — J45909 Unspecified asthma, uncomplicated: Secondary | ICD-10-CM | POA: Diagnosis not present

## 2018-05-16 HISTORY — DX: Chronic diastolic (congestive) heart failure: I50.32

## 2018-05-16 HISTORY — DX: Atrial premature depolarization: I49.1

## 2018-05-16 HISTORY — DX: Orthostatic hypotension: I95.1

## 2018-05-16 MED ORDER — NITROGLYCERIN 0.4 MG SL SUBL: 0.4000 mg | SUBLINGUAL_TABLET | SUBLINGUAL | 3 refills | Status: DC | PRN

## 2018-05-17 ENCOUNTER — Encounter: Payer: Self-pay | Admitting: Cardiology

## 2018-06-01 ENCOUNTER — Ambulatory Visit: Payer: Medicare Other | Admitting: Cardiology

## 2018-06-08 ENCOUNTER — Other Ambulatory Visit: Payer: Self-pay

## 2018-06-08 ENCOUNTER — Encounter: Payer: Self-pay | Admitting: Cardiology

## 2018-06-08 ENCOUNTER — Ambulatory Visit (INDEPENDENT_AMBULATORY_CARE_PROVIDER_SITE_OTHER): Payer: Medicare Other | Admitting: Cardiology

## 2018-06-08 VITALS — BP 158/84 | HR 85 | Ht 68.0 in | Wt 258.0 lb

## 2018-06-08 DIAGNOSIS — G4733 Obstructive sleep apnea (adult) (pediatric): Secondary | ICD-10-CM

## 2018-06-08 DIAGNOSIS — R002 Palpitations: Secondary | ICD-10-CM | POA: Insufficient documentation

## 2018-06-08 DIAGNOSIS — I10 Essential (primary) hypertension: Secondary | ICD-10-CM

## 2018-06-08 DIAGNOSIS — I2510 Atherosclerotic heart disease of native coronary artery without angina pectoris: Secondary | ICD-10-CM

## 2018-06-08 HISTORY — DX: Palpitations: R00.2

## 2018-06-08 HISTORY — DX: Obstructive sleep apnea (adult) (pediatric): G47.33

## 2018-06-08 MED ORDER — METOPROLOL TARTRATE 25 MG PO TABS: 12.5000 mg | ORAL_TABLET | Freq: Two times a day (BID) | ORAL | 3 refills | Status: DC

## 2018-06-08 NOTE — Progress Notes (Signed)
Follow up visit  Subjective:   Angie Hill, female    DOB: October 26, 1940, 78 y.o.   MRN: [id number]   Chief Complaint  Patient presents with  . Congestive Heart Failure  . Hypertension    HPI  78 y.o. African American female with hypertension, CAD s/p LAD PCI 2014, type 2 DM, OSA-not on CPAP, palpitations.  Recent workup showed low risk nuclear stress test, low normal LVEF, mild MR, and TR, PVC and PAC's on event monitor, without atrial fibrillation.   Patient is here for follow up visit. She does not have any lightheadedness symptoms, but continues to have palpitations. She has been diagnosed with OSA in the past, but does not use CPAP.   Past Medical History:  Diagnosis Date  . Anemia   . Angina pectoris (HCC)   . Arthritis   . Asthma   . COPD (chronic obstructive pulmonary disease) (HCC)   . Coronary artery disease   . Depression   . Diabetes mellitus without complication (HCC)    BORDERLINE  . GERD (gastroesophageal reflux disease)   . H/O hiatal hernia   . Hypertension   . Shortness of breath   . Sleep apnea      Past Surgical History:  Procedure Laterality Date  . ABDOMINAL HYSTERECTOMY     partial  . CORONARY ANGIOPLASTY WITH STENT PLACEMENT  07/04/2012   mid LAD  . LEFT AND RIGHT HEART CATHETERIZATION WITH CORONARY ANGIOGRAM N/A 07/04/2012   Procedure: LEFT AND RIGHT HEART CATHETERIZATION WITH CORONARY ANGIOGRAM;  Surgeon: Pamella Pert, MD;  Location: [facility] CATH LAB;  Service: Cardiovascular;  Laterality: N/A;  . LEFT HEART CATHETERIZATION WITH CORONARY ANGIOGRAM N/A 10/17/2012   Procedure: LEFT HEART CATHETERIZATION WITH CORONARY ANGIOGRAM;  Surgeon: Pamella Pert, MD;  Location: [facility] CATH LAB;  Service: Cardiovascular;  Laterality: N/A;  . PARTIAL COLECTOMY  1982  . PERCUTANEOUS CORONARY STENT INTERVENTION (PCI-S)  07/04/2012   Procedure: PERCUTANEOUS CORONARY STENT INTERVENTION (PCI-S);  Surgeon: Pamella Pert, MD;  Location: [facility] CATH LAB;  Service:  Cardiovascular;;  . TUBAL LIGATION      Social History   Socioeconomic History  . Marital status: Single    Spouse name: Not on file  . Number of children: 5  . Years of education: Not on file  . Highest education level: Not on file  Occupational History  . Not on file  Social Needs  . Financial resource strain: Not on file  . Food insecurity:    Worry: Not on file    Inability: Not on file  . Transportation needs:    Medical: Not on file    Non-medical: Not on file  Tobacco Use  . Smoking status: Never Smoker  . Smokeless tobacco: Never Used  Substance and Sexual Activity  . Alcohol use: No  . Drug use: No  . Sexual activity: Not on file  Lifestyle  . Physical activity:    Days per week: Not on file    Minutes per session: Not on file  . Stress: Not on file  Relationships  . Social connections:    Talks on phone: Not on file    Gets together: Not on file    Attends religious service: Not on file    Active member of club or organization: Not on file    Attends meetings of clubs or organizations: Not on file    Relationship status: Not on file  . Intimate partner violence:    Fear of  current or ex partner: Not on file    Emotionally abused: Not on file    Physically abused: Not on file    Forced sexual activity: Not on file  Other Topics Concern  . Not on file  Social History Narrative  . Not on file     Current Outpatient Medications on File Prior to Visit  Medication Sig Dispense Refill  . albuterol (PROVENTIL HFA;VENTOLIN HFA) 108 (90 Base) MCG/ACT inhaler Inhale 2 puffs into the lungs 2 (two) times daily as needed for wheezing or shortness of breath. 1 Inhaler 0  . allopurinol (ZYLOPRIM) 300 MG tablet Take 300 mg by mouth daily.    Marland Kitchen amLODipine-valsartan (EXFORGE) 10-320 MG per tablet Take 1 tablet by mouth every morning.    Marland Kitchen aspirin EC 81 MG tablet Take 81 mg by mouth daily.    Marland Kitchen atorvastatin (LIPITOR) 80 MG tablet Take 80 mg by mouth daily.    Marland Kitchen b  complex vitamins tablet Take 1 tablet by mouth daily.    . budesonide-formoterol (SYMBICORT) 160-4.5 MCG/ACT inhaler Inhale 2 puffs into the lungs daily.    . Calcium Carbonate-Vitamin D (CALCIUM + D PO) Take 1 tablet by mouth daily.    . Cyanocobalamin (VITAMIN B-12 PO) Take 1 tablet by mouth daily.     Marland Kitchen EPINEPHrine (EPI-PEN) 0.3 mg/0.3 mL SOAJ Inject 0.3 mg into the muscle once as needed (for allergic reaction to pollen).    . gabapentin (NEURONTIN) 300 MG capsule Take 300 mg by mouth 2 (two) times daily.     Marland Kitchen glipiZIDE (GLUCOTROL) 5 MG tablet Take by mouth daily before breakfast.    . HYDROcodone-acetaminophen (NORCO/VICODIN) 5-325 MG per tablet Take 1 tablet by mouth every 6 (six) hours as needed for pain.    . hydrOXYzine (VISTARIL) 50 MG capsule Take 50 mg by mouth 4 (four) times daily as needed for itching.    . meloxicam (MOBIC) 7.5 MG tablet Take 7.5 mg by mouth daily.    . mirtazapine (REMERON) 15 MG tablet Take 15 mg by mouth at bedtime as needed (for sleep).    . nitroGLYCERIN (NITROSTAT) 0.4 MG SL tablet Place 1 tablet (0.4 mg total) under the tongue every 5 (five) minutes as needed for chest pain. 25 tablet 3  . ondansetron (ZOFRAN-ODT) 4 MG disintegrating tablet Take 1 tablet (4 mg total) by mouth every 8 (eight) hours as needed for nausea or vomiting. 6 tablet 0  . pantoprazole (PROTONIX) 40 MG tablet Take 40 mg by mouth daily.     . polyethylene glycol (MIRALAX) packet Take 17 g by mouth daily. 7 each 0  . promethazine (PHENERGAN) 25 MG tablet Take 25 mg by mouth every 6 (six) hours as needed for nausea.    Marland Kitchen amoxicillin (AMOXIL) 500 MG capsule Take 1 capsule (500 mg total) by mouth 3 (three) times daily. (Patient not taking: Reported on 07/13/2017) 21 capsule 0  . azithromycin (ZITHROMAX) 250 MG tablet Take 2 tabs day 1, then 1 tab daily (Patient not taking: Reported on 05/15/2018) 6 each 0  . benzonatate (TESSALON) 100 MG capsule Take 1 capsule (100 mg total) by mouth every 8  (eight) hours. (Patient not taking: Reported on 05/15/2018) 21 capsule 0  . clobetasol cream (TEMOVATE) 0.05 % Apply 1 application topically daily. Apply to rash.    . doxycycline (VIBRAMYCIN) 100 MG capsule Take 1 capsule (100 mg total) by mouth 2 (two) times daily. (Patient not taking: Reported on 05/15/2018) 10 capsule 0  .  hydrALAZINE (APRESOLINE) 50 MG tablet Take 50 mg by mouth 2 (two) times daily.     . hydrochlorothiazide (HYDRODIURIL) 25 MG tablet Take 1 tablet (25 mg total) by mouth daily. (Patient not taking: Reported on 05/15/2018) 30 tablet 4  . HYDROcodone-acetaminophen (NORCO/VICODIN) 5-325 MG tablet Take 1-2 tablets by mouth every 6 (six) hours as needed for moderate pain. (Patient not taking: Reported on 05/15/2018) 15 tablet 0  . nebivolol 20 MG TABS Take 1 tablet (20 mg total) by mouth daily. (Patient not taking: Reported on 05/15/2018) 30 tablet 3  . Polyvinyl Alcohol-Povidone (REFRESH OP) Place 1 drop into both eyes daily as needed (for dry eyes).    Marland Kitchen tiotropium (SPIRIVA) 18 MCG inhalation capsule Place 18 mcg into inhaler and inhale daily.    . traMADol (ULTRAM) 50 MG tablet Take 1 tablet (50 mg total) by mouth every 6 (six) hours as needed. Take 1 tablet 3 times daily x 5 days, then every 6 hours as needed. (Patient not taking: Reported on 05/15/2018) 20 tablet 0   No current facility-administered medications on file prior to visit.     Cardiovascular studies:  48 hour holter monitor 2018/04/29:  Normal sinus rhythm. Min HR 60 and Max HR 103. Occasional PVC (4.3% burden) and rare PAC (<1% burden). No A fib or SVT noted. No heart block.   Lexiscan myoview stress test 06/06/2017:  1. Pharmacologic stress testing was performed with intravenous administration of .4 mg of Lexiscan over a 10-15 seconds infusion. Stress symptoms included chest tightness, dyspnea, dizziness, headache. Normal blood pressure. Exercise capacity not assessed. Stress EKG is non diagnostic for ischemia as it is a  pharmacologic stress.  2. The overall quality of the study is fair. Review of the raw data in a rotational cine format reveals breast attenuation with imaging performed in sitting position. Attenuation is most pronounced on rest images. Left ventricular cavity is noted to be normal on the rest and stress studies. Gated SPECT images reveal normal myocardial thickening and wall motion. The left ventricular ejection fraction was calculated or visually estimated to be 65%. SPECT rest images demonstrate a large area of mild intensity perfusion defect in basal to apical inferior, inferoseptal and apical inferolateral myocardial walls. Stress SPECT images demonstrate only a small area of mild intensity perfusion defect in apical inferior inferolateral myocardial walls. While perfusion defect on rest images likely represent breast attenuation, perfusion defect, perfusion defect on stress images could represent small area of mild ischemia.  3. Low risk study.  Echocardiogram 04/12/2018: Left ventricle cavity is normal in size. Moderate concentric hypertrophy of the left ventricle. Low normal global wall motion. Visual EF is 50-55%. Doppler evidence of grade I (impaired) diastolic dysfunction, normal LAP. Calculated EF 48%. Mild (Grade I) mitral regurgitation. Mild tricuspid regurgitation. Estimated pulmonary artery systolic pressure 29 mmHg. No significant change compared to previous study on 06/15/2017.  Coronary angiogram 2014: Patent pLAD stent (4.0X22 mm Integrity BMS) Mid LAD mod stenosis. FFR 0.86 (Nonsignificant) LCx: Ostial 20-30% stenosis. Ostial AV grove 30% stenosis RCA: Mid tandem 20-30% stenosis and 40-50% stenoses  Recent labs: Results for TYNLEIGH, AGUSTIN (MRN [id number]) as of 06/08/2018 12:43  Ref. Range 04/25/2018 16:14  COMPREHENSIVE METABOLIC PANEL Unknown Rpt (A)  Sodium Latest Ref Range: 135 - 145 mmol/L 141  Potassium Latest Ref Range: 3.5 - 5.1 mmol/L 3.4 (L)  Chloride Latest  Ref Range: 98 - 111 mmol/L 104  CO2 Latest Ref Range: 22 - 32 mmol/L 28  Glucose Latest  Ref Range: 70 - 99 mg/dL 74  BUN Latest Ref Range: 8 - 23 mg/dL 16  Creatinine Latest Ref Range: 0.44 - 1.00 mg/dL 6.960.58  Calcium Latest Ref Range: 8.9 - 10.3 mg/dL 9.5  Anion gap Latest Ref Range: 5 - 15  9  Alkaline Phosphatase Latest Ref Range: 38 - 126 U/L 43  Albumin Latest Ref Range: 3.5 - 5.0 g/dL 3.9  AST Latest Ref Range: 15 - 41 U/L 19  ALT Latest Ref Range: 0 - 44 U/L 23  Total Protein Latest Ref Range: 6.5 - 8.1 g/dL 7.1  Total Bilirubin Latest Ref Range: 0.3 - 1.2 mg/dL 0.9  GFR, Est Non African American Latest Ref Range: >60 mL/min >60  GFR, Est African American Latest Ref Range: >60 mL/min >60  B Natriuretic Peptide Latest Ref Range: 0.0 - 100.0 pg/mL 51.9   Results for Rogene HoustonDAVIS, Zakiah M (MRN [id number]) as of 06/08/2018 12:43  Ref. Range 04/25/2018 16:14  WBC Latest Ref Range: 4.0 - 10.5 K/uL 10.6 (H)  RBC Latest Ref Range: 3.87 - 5.11 MIL/uL 4.68  Hemoglobin Latest Ref Range: 12.0 - 15.0 g/dL 44.012.1  HCT Latest Ref Range: 36.0 - 46.0 % 39.4  MCV Latest Ref Range: 80.0 - 100.0 fL 84.2  MCH Latest Ref Range: 26.0 - 34.0 pg 25.9 (L)  MCHC Latest Ref Range: 30.0 - 36.0 g/dL 10.230.7  RDW Latest Ref Range: 11.5 - 15.5 % 13.8  Platelets Latest Ref Range: 150 - 400 K/uL 345  nRBC Latest Ref Range: 0.0 - 0.2 % 0.0     Review of Systems  Constitution: Negative for decreased appetite, malaise/fatigue, weight gain and weight loss.  HENT: Negative for congestion.   Eyes: Negative for visual disturbance.  Cardiovascular: Positive for dyspnea on exertion and palpitations. Negative for chest pain, leg swelling and syncope.  Respiratory: Negative for shortness of breath.   Endocrine: Negative for cold intolerance.  Hematologic/Lymphatic: Does not bruise/bleed easily.  Skin: Negative for itching and rash.  Musculoskeletal: Negative for myalgias.  Gastrointestinal: Negative for abdominal pain, nausea  and vomiting.  Genitourinary: Negative for dysuria.  Neurological: Negative for dizziness and weakness.  Psychiatric/Behavioral: The patient is not nervous/anxious.   All other systems reviewed and are negative.        Vitals:   06/08/18 1042  BP: (!) 158/84  Pulse: 85  SpO2: 96%    Objective:   Physical Exam  Constitutional: She is oriented to person, place, and time. She appears well-developed and well-nourished. No distress.  HENT:  Head: Normocephalic and atraumatic.  Eyes: Pupils are equal, round, and reactive to light. Conjunctivae are normal.  Neck: No JVD present.  Cardiovascular: Normal rate, regular rhythm and intact distal pulses.  No murmur heard. Pulmonary/Chest: Effort normal and breath sounds normal. She has no wheezes. She has no rales.  Abdominal: Soft. Bowel sounds are normal. There is no rebound.  Musculoskeletal:        General: No edema.  Lymphadenopathy:    She has no cervical adenopathy.  Neurological: She is alert and oriented to person, place, and time. No cranial nerve deficit.  Skin: Skin is warm and dry.  Psychiatric: She has a normal mood and affect.  Nursing note and vitals reviewed.         Assessment & Recommendations:   78 y/o [place] female with hypertension, CAD s/p LAD PCI 2014, type 2 DM, OSA-not on CPAP, palpitations.  1. Palpitations Start low dose metoprolol 12.5 mg bid/  2.  Coronary artery disease involving native coronary artery of native heart without angina pectoris No angina symptoms. Continue Aspirin/statin.   3. OSA (obstructive sleep apnea) Referred to sleep study. This may not occur for next several weeks given the COVID outbreak  4. Essential hypertension Suboptimal control. Patient does not have her medications or medication list with her today. I encouraged to bring these with her at her next visit.   Return visit in 8 weeks.   Elder Negus, MD [facility] [place] Pager:  [phone] Office: [phone] If no answer Cell [phone]

## 2018-06-16 DIAGNOSIS — J45909 Unspecified asthma, uncomplicated: Secondary | ICD-10-CM | POA: Diagnosis not present

## 2018-06-27 DIAGNOSIS — E119 Type 2 diabetes mellitus without complications: Secondary | ICD-10-CM | POA: Diagnosis not present

## 2018-06-27 DIAGNOSIS — M79671 Pain in right foot: Secondary | ICD-10-CM | POA: Diagnosis not present

## 2018-06-27 DIAGNOSIS — M79672 Pain in left foot: Secondary | ICD-10-CM | POA: Diagnosis not present

## 2018-06-27 DIAGNOSIS — B351 Tinea unguium: Secondary | ICD-10-CM | POA: Diagnosis not present

## 2018-06-27 DIAGNOSIS — L84 Corns and callosities: Secondary | ICD-10-CM | POA: Diagnosis not present

## 2018-07-16 DIAGNOSIS — J45909 Unspecified asthma, uncomplicated: Secondary | ICD-10-CM | POA: Diagnosis not present

## 2018-07-26 DIAGNOSIS — I10 Essential (primary) hypertension: Secondary | ICD-10-CM | POA: Diagnosis not present

## 2018-07-26 DIAGNOSIS — M25562 Pain in left knee: Secondary | ICD-10-CM | POA: Diagnosis not present

## 2018-07-26 DIAGNOSIS — E1151 Type 2 diabetes mellitus with diabetic peripheral angiopathy without gangrene: Secondary | ICD-10-CM | POA: Diagnosis not present

## 2018-07-26 DIAGNOSIS — J302 Other seasonal allergic rhinitis: Secondary | ICD-10-CM | POA: Diagnosis not present

## 2018-07-26 DIAGNOSIS — E1140 Type 2 diabetes mellitus with diabetic neuropathy, unspecified: Secondary | ICD-10-CM | POA: Diagnosis not present

## 2018-07-26 DIAGNOSIS — E785 Hyperlipidemia, unspecified: Secondary | ICD-10-CM | POA: Diagnosis not present

## 2018-07-26 NOTE — Progress Notes (Signed)
Subjective:   Angie Hill, female    DOB: 10-15-1940, 78 y.o.   MRN: [id number]   Chief Complaint  Patient presents with  . Chest Pain    pt c/o palpitations went to pcp yesterday   This visit type was conducted due to national recommendations for restrictions regarding the COVID-19 Pandemic (e.g. social distancing).  This format is felt to be most appropriate for this patient at this time.  All issues noted in this document were discussed and addressed.  No physical exam was performed (except for noted visual exam findings with Telehealth visits).  The patient has consented to conduct a Telehealth visit and understands insurance will be billed.   I discussed the limitations of evaluation and management by telemedicine and the availability of in person appointments. The patient expressed understanding and agreed to proceed.  Virtual Visit via Video Note is as below  I connected with Angie Hill, on 07/27/18 at 1040 by telephone and verified that I am speaking with the correct person using two identifiers. Unable to perform video visit as patient did not have equipment.    I have discussed with the patient regarding the safety during COVID Pandemic and steps and precautions including social distancing with the patient.    HPI  78 y.o. African American female with hypertension, CAD s/p LAD PCI 2014, type 2 DM, OSA-not on CPAP, palpitations.  Recent workup showed low risk nuclear stress test, low normal LVEF, mild MR, and TR, PVC and PAC's on event monitor, without atrial fibrillation.   She was started on Metoprolol 12.5 mg BID at her last office visit. Recently, a few days ago, she was seen by her PCP for palpitations and episode of sharp chest pain under her left breast that radiated up to her shoulder. Metoprolol was increased to 50 mg and since doing this, palpitations are better. She has not had any further episodes of chest pain. States that she is being referred to ortho for  knee pain. 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    She has been diagnosed with OSA in the past, but does not use CPAP.   Past Medical History:  Diagnosis Date  . Anemia   . Angina pectoris (HCC)   . Arthritis   . Asthma   . COPD (chronic obstructive pulmonary disease) (HCC)   . Coronary artery disease   . Depression   . Diabetes mellitus without complication (HCC)    BORDERLINE  . GERD (gastroesophageal reflux disease)   . H/O hiatal hernia   . Hypertension   . Shortness of breath   . Sleep apnea      Past Surgical History:  Procedure Laterality Date  . ABDOMINAL HYSTERECTOMY     partial  . CORONARY ANGIOPLASTY WITH STENT PLACEMENT  07/04/2012   mid LAD  . LEFT AND RIGHT HEART CATHETERIZATION WITH CORONARY ANGIOGRAM N/A 07/04/2012   Procedure: LEFT AND RIGHT HEART CATHETERIZATION WITH CORONARY ANGIOGRAM;  Surgeon: Pamella Pert, MD;  Location: [facility] CATH LAB;  Service: Cardiovascular;  Laterality: N/A;  . LEFT HEART CATHETERIZATION WITH CORONARY ANGIOGRAM N/A 10/17/2012   Procedure: LEFT HEART CATHETERIZATION WITH CORONARY ANGIOGRAM;  Surgeon: Pamella Pert, MD;  Location: [facility] CATH LAB;  Service: Cardiovascular;  Laterality: N/A;  . PARTIAL COLECTOMY  1982  . PERCUTANEOUS CORONARY STENT INTERVENTION (PCI-S)  07/04/2012   Procedure: PERCUTANEOUS CORONARY STENT INTERVENTION (PCI-S);  Surgeon: Harland Dingwall  Jacinto HalimGanji, MD;  Location: [facility] CATH LAB;  Service: Cardiovascular;;  . TUBAL LIGATION      Social History   Socioeconomic History  . Marital status: Single    Spouse name: Not on file  . Number of children: 5  . Years of education: Not on file  . Highest education level: Not on file  Occupational History  . Not on file  Social Needs  . Financial resource strain: Not on file  . Food insecurity:    Worry: Not on file    Inability: Not on file  . Transportation needs:    Medical: Not on file    Non-medical: Not  on file  Tobacco Use  . Smoking status: Never Smoker  . Smokeless tobacco: Never Used  Substance and Sexual Activity  . Alcohol use: No  . Drug use: No  . Sexual activity: Not on file  Lifestyle  . Physical activity:    Days per week: Not on file    Minutes per session: Not on file  . Stress: Not on file  Relationships  . Social connections:    Talks on phone: Not on file    Gets together: Not on file    Attends religious service: Not on file    Active member of club or organization: Not on file    Attends meetings of clubs or organizations: Not on file    Relationship status: Not on file  . Intimate partner violence:    Fear of current or ex partner: Not on file    Emotionally abused: Not on file    Physically abused: Not on file    Forced sexual activity: Not on file  Other Topics Concern  . Not on file  Social History Narrative  . Not on file     Current Outpatient Medications on File Prior to Visit  Medication Sig Dispense Refill  . albuterol (PROVENTIL HFA;VENTOLIN HFA) 108 (90 Base) MCG/ACT inhaler Inhale 2 puffs into the lungs 2 (two) times daily as needed for wheezing or shortness of breath. 1 Inhaler 0  . allopurinol (ZYLOPRIM) 300 MG tablet Take 300 mg by mouth daily.    Marland Kitchen. amlodipine-olmesartan (AZOR) 10-20 MG tablet Take 1 tablet by mouth daily.    Marland Kitchen. aspirin EC 81 MG tablet Take 81 mg by mouth daily.    Marland Kitchen. atorvastatin (LIPITOR) 80 MG tablet Take 80 mg by mouth daily.    Marland Kitchen. b complex vitamins tablet Take 1 tablet by mouth daily.    . budesonide-formoterol (SYMBICORT) 160-4.5 MCG/ACT inhaler Inhale 2 puffs into the lungs daily.    . celecoxib (CELEBREX) 100 MG capsule Take 100 mg by mouth daily.    . clobetasol cream (TEMOVATE) 0.05 % Apply 1 application topically daily. Apply to rash.    . Cyanocobalamin (VITAMIN B-12 PO) Take 1 tablet by mouth daily.     Marland Kitchen. gabapentin (NEURONTIN) 600 MG tablet Take 600 mg by mouth 3 (three) times daily.    Marland Kitchen. glipiZIDE  (GLUCOTROL) 5 MG tablet Take by mouth daily before breakfast.    . metoprolol tartrate (LOPRESSOR) 50 MG tablet Take 1 tablet by mouth daily.    . pantoprazole (PROTONIX) 40 MG tablet Take 40 mg by mouth daily.     . polyethylene glycol (MIRALAX) packet Take 17 g by mouth daily. 7 each 0  . potassium chloride (K-DUR) 10 MEQ tablet Take 10 mEq by mouth 2 (two) times daily.    . hydrALAZINE (APRESOLINE) 50 MG tablet  Take 50 mg by mouth 2 (two) times daily.     . hydrochlorothiazide (HYDRODIURIL) 25 MG tablet Take 1 tablet (25 mg total) by mouth daily. (Patient not taking: Reported on 05/15/2018) 30 tablet 4  . HYDROcodone-acetaminophen (NORCO/VICODIN) 5-325 MG per tablet Take 1 tablet by mouth every 6 (six) hours as needed for pain.    Marland Kitchen HYDROcodone-acetaminophen (NORCO/VICODIN) 5-325 MG tablet Take 1-2 tablets by mouth every 6 (six) hours as needed for moderate pain. (Patient not taking: Reported on 05/15/2018) 15 tablet 0  . hydrOXYzine (VISTARIL) 50 MG capsule Take 50 mg by mouth 4 (four) times daily as needed for itching.    . meloxicam (MOBIC) 7.5 MG tablet Take 7.5 mg by mouth daily.    . mirtazapine (REMERON) 15 MG tablet Take 15 mg by mouth at bedtime as needed (for sleep).    . nebivolol 20 MG TABS Take 1 tablet (20 mg total) by mouth daily. (Patient not taking: Reported on 05/15/2018) 30 tablet 3  . Polyvinyl Alcohol-Povidone (REFRESH OP) Place 1 drop into both eyes daily as needed (for dry eyes).    Marland Kitchen tiotropium (SPIRIVA) 18 MCG inhalation capsule Place 18 mcg into inhaler and inhale daily.     No current facility-administered medications on file prior to visit.     Cardiovascular studies:  48 hour holter monitor 05/01/18:  Normal sinus rhythm. Min HR 60 and Max HR 103. Occasional PVC (4.3% burden) and rare PAC (<1% burden). No A fib or SVT noted. No heart block.   Lexiscan myoview stress test 06/06/2017:  1. Pharmacologic stress testing was performed with intravenous administration of  .4 mg of Lexiscan over a 10-15 seconds infusion. Stress symptoms included chest tightness, dyspnea, dizziness, headache. Normal blood pressure. Exercise capacity not assessed. Stress EKG is non diagnostic for ischemia as it is a pharmacologic stress.  2. The overall quality of the study is fair. Review of the raw data in a rotational cine format reveals breast attenuation with imaging performed in sitting position. Attenuation is most pronounced on rest images. Left ventricular cavity is noted to be normal on the rest and stress studies. Gated SPECT images reveal normal myocardial thickening and wall motion. The left ventricular ejection fraction was calculated or visually estimated to be 65%. SPECT rest images demonstrate a large area of mild intensity perfusion defect in basal to apical inferior, inferoseptal and apical inferolateral myocardial walls. Stress SPECT images demonstrate only a small area of mild intensity perfusion defect in apical inferior inferolateral myocardial walls. While perfusion defect on rest images likely represent breast attenuation, perfusion defect, perfusion defect on stress images could represent small area of mild ischemia.  3. Low risk study.  Echocardiogram 04/12/2018: Left ventricle cavity is normal in size. Moderate concentric hypertrophy of the left ventricle. Low normal global wall motion. Visual EF is 50-55%. Doppler evidence of grade I (impaired) diastolic dysfunction, normal LAP. Calculated EF 48%. Mild (Grade I) mitral regurgitation. Mild tricuspid regurgitation. Estimated pulmonary artery systolic pressure 29 mmHg. No significant change compared to previous study on 06/15/2017.  Coronary angiogram 2014: Patent pLAD stent (4.0X22 mm Integrity BMS) Mid LAD mod stenosis. FFR 0.86 (Nonsignificant) LCx: Ostial 20-30% stenosis. Ostial AV grove 30% stenosis RCA: Mid tandem 20-30% stenosis and 40-50% stenoses  Recent labs: Results for KYNDELL, ZEISER (MRN  [id number]) as of 06/08/2018 12:43  Ref. Range 04/25/2018 16:14  COMPREHENSIVE METABOLIC PANEL Unknown Rpt (A)  Sodium Latest Ref Range: 135 - 145 mmol/L 141  Potassium Latest Ref  Range: 3.5 - 5.1 mmol/L 3.4 (L)  Chloride Latest Ref Range: 98 - 111 mmol/L 104  CO2 Latest Ref Range: 22 - 32 mmol/L 28  Glucose Latest Ref Range: 70 - 99 mg/dL 74  BUN Latest Ref Range: 8 - 23 mg/dL 16  Creatinine Latest Ref Range: 0.44 - 1.00 mg/dL 2.12  Calcium Latest Ref Range: 8.9 - 10.3 mg/dL 9.5  Anion gap Latest Ref Range: 5 - 15  9  Alkaline Phosphatase Latest Ref Range: 38 - 126 U/L 43  Albumin Latest Ref Range: 3.5 - 5.0 g/dL 3.9  AST Latest Ref Range: 15 - 41 U/L 19  ALT Latest Ref Range: 0 - 44 U/L 23  Total Protein Latest Ref Range: 6.5 - 8.1 g/dL 7.1  Total Bilirubin Latest Ref Range: 0.3 - 1.2 mg/dL 0.9  GFR, Est Non African American Latest Ref Range: >60 mL/min >60  GFR, Est African American Latest Ref Range: >60 mL/min >60  B Natriuretic Peptide Latest Ref Range: 0.0 - 100.0 pg/mL 51.9   Results for ASHLY, DUNKLIN (MRN [id number]) as of 06/08/2018 12:43  Ref. Range 04/25/2018 16:14  WBC Latest Ref Range: 4.0 - 10.5 K/uL 10.6 (H)  RBC Latest Ref Range: 3.87 - 5.11 MIL/uL 4.68  Hemoglobin Latest Ref Range: 12.0 - 15.0 g/dL 04.8  HCT Latest Ref Range: 36.0 - 46.0 % 39.4  MCV Latest Ref Range: 80.0 - 100.0 fL 84.2  MCH Latest Ref Range: 26.0 - 34.0 pg 25.9 (L)  MCHC Latest Ref Range: 30.0 - 36.0 g/dL 88.9  RDW Latest Ref Range: 11.5 - 15.5 % 13.8  Platelets Latest Ref Range: 150 - 400 K/uL 345  nRBC Latest Ref Range: 0.0 - 0.2 % 0.0     Review of Systems  Constitution: Negative for decreased appetite, malaise/fatigue, weight gain and weight loss.  HENT: Negative for congestion.   Eyes: Negative for visual disturbance.  Cardiovascular: Positive for dyspnea on exertion and palpitations. Negative for chest pain, leg swelling and syncope.  Respiratory: Negative for shortness of breath.    Endocrine: Negative for cold intolerance.  Hematologic/Lymphatic: Does not bruise/bleed easily.  Skin: Negative for itching and rash.  Musculoskeletal: Negative for myalgias.  Gastrointestinal: Negative for abdominal pain, nausea and vomiting.  Genitourinary: Negative for dysuria.  Neurological: Negative for dizziness and weakness.  Psychiatric/Behavioral: The patient is not nervous/anxious.   All other systems reviewed and are negative.        There were no vitals filed for this visit.   Physical exam not performed or limited due to virtual visit.  Patient appeared to be in no distress, Neck was supple, respiration was not labored.  Please see exam details from prior visit is as below.   Objective:   Physical Exam  Constitutional: She is oriented to person, place, and time. She appears well-developed and well-nourished. No distress.  HENT:  Head: Normocephalic and atraumatic.  Eyes: Pupils are equal, round, and reactive to light. Conjunctivae are normal.  Neck: No JVD present.  Cardiovascular: Normal rate, regular rhythm and intact distal pulses.  No murmur heard. Pulmonary/Chest: Effort normal and breath sounds normal. She has no wheezes. She has no rales.  Abdominal: Soft. Bowel sounds are normal. There is no rebound.  Musculoskeletal:        General: No edema.  Lymphadenopathy:    She has no cervical adenopathy.  Neurological: She is alert and oriented to person, place, and time. No cranial nerve deficit.  Skin: Skin is warm and  dry.  Psychiatric: She has a normal mood and affect.  Nursing note and vitals reviewed.         Assessment & Recommendations:   78 y/o [place] American female with hypertension, CAD s/p LAD PCI 2014, type 2 DM, OSA-not on CPAP, palpitations.  1. Coronary artery disease involving native coronary artery of native heart without angina pectoris Has not had exertional chest pain. Had reassuring nuclear stress test. On appropriate medical  therapy. I have sent in nitroglycerin for her to use as needed. If she has nitroglycerin responsive chest pain, will further evaluate.  2. Musculoskeletal chest pain I continue to feel that her chest pain is musculoskeletal etiology. She has as needed Meloxicam. Encouraged her to use tylenol as needed. Also try using heat/ice to her chest wall area to see if this will help.  3. Palpitations Improved with increasing Metoprolol, will continue the same.   4. Essential hypertension Does not have a way to monitor her BP at home, but states was elevated at PCP office the other day. Hopefully will improve with increasing Metoprolol.   Plan: Will see her back in the office in 3 months, but encouraged her to see me sooner if needed.    Toniann Fail, MSN, APRN, FNP-C [facility] [place] Office: [phone] Fax: [phone]

## 2018-07-27 ENCOUNTER — Encounter: Payer: Self-pay | Admitting: Cardiology

## 2018-07-27 ENCOUNTER — Other Ambulatory Visit: Payer: Self-pay

## 2018-07-27 ENCOUNTER — Ambulatory Visit (INDEPENDENT_AMBULATORY_CARE_PROVIDER_SITE_OTHER): Payer: Medicare Other | Admitting: Cardiology

## 2018-07-27 DIAGNOSIS — R0789 Other chest pain: Secondary | ICD-10-CM | POA: Diagnosis not present

## 2018-07-27 DIAGNOSIS — I2510 Atherosclerotic heart disease of native coronary artery without angina pectoris: Secondary | ICD-10-CM | POA: Diagnosis not present

## 2018-07-27 DIAGNOSIS — I10 Essential (primary) hypertension: Secondary | ICD-10-CM

## 2018-07-27 DIAGNOSIS — R002 Palpitations: Secondary | ICD-10-CM

## 2018-07-27 MED ORDER — NITROGLYCERIN 0.4 MG SL SUBL: 0.4000 mg | SUBLINGUAL_TABLET | SUBLINGUAL | 3 refills | Status: AC | PRN

## 2018-08-02 ENCOUNTER — Ambulatory Visit: Payer: Medicare Other | Admitting: Cardiology

## 2018-08-03 DIAGNOSIS — I10 Essential (primary) hypertension: Secondary | ICD-10-CM | POA: Diagnosis not present

## 2018-08-03 DIAGNOSIS — E1151 Type 2 diabetes mellitus with diabetic peripheral angiopathy without gangrene: Secondary | ICD-10-CM | POA: Diagnosis not present

## 2018-08-03 DIAGNOSIS — E785 Hyperlipidemia, unspecified: Secondary | ICD-10-CM | POA: Diagnosis not present

## 2018-08-03 DIAGNOSIS — J449 Chronic obstructive pulmonary disease, unspecified: Secondary | ICD-10-CM | POA: Diagnosis not present

## 2018-08-03 DIAGNOSIS — E1140 Type 2 diabetes mellitus with diabetic neuropathy, unspecified: Secondary | ICD-10-CM | POA: Diagnosis not present

## 2018-08-03 DIAGNOSIS — J302 Other seasonal allergic rhinitis: Secondary | ICD-10-CM | POA: Diagnosis not present

## 2018-08-16 DIAGNOSIS — J45909 Unspecified asthma, uncomplicated: Secondary | ICD-10-CM | POA: Diagnosis not present

## 2018-08-17 ENCOUNTER — Telehealth: Payer: Self-pay

## 2018-08-17 ENCOUNTER — Institutional Professional Consult (permissible substitution): Payer: Medicaid Other | Admitting: Neurology

## 2018-08-17 ENCOUNTER — Telehealth: Payer: Self-pay | Admitting: Neurology

## 2018-08-17 NOTE — Telephone Encounter (Signed)
Pt did not show for their appt with Dr. Athar today.  

## 2018-08-17 NOTE — Telephone Encounter (Signed)
Pt no showed to sleep consult on 08/29/17 and again today on 08/17/18. Do you want to dismiss this pt?

## 2018-08-17 NOTE — Telephone Encounter (Signed)
First NS was about a year ago, so from my end of things, patient can be rescheduled to Dr. Algis Downs or myself.

## 2018-08-21 NOTE — Telephone Encounter (Signed)
Ok will reschedule her. Thank you.

## 2018-08-25 DIAGNOSIS — E1151 Type 2 diabetes mellitus with diabetic peripheral angiopathy without gangrene: Secondary | ICD-10-CM | POA: Diagnosis not present

## 2018-08-25 DIAGNOSIS — Z0000 Encounter for general adult medical examination without abnormal findings: Secondary | ICD-10-CM | POA: Diagnosis not present

## 2018-08-25 DIAGNOSIS — I10 Essential (primary) hypertension: Secondary | ICD-10-CM | POA: Diagnosis not present

## 2018-08-25 DIAGNOSIS — E1140 Type 2 diabetes mellitus with diabetic neuropathy, unspecified: Secondary | ICD-10-CM | POA: Diagnosis not present

## 2018-08-25 DIAGNOSIS — E785 Hyperlipidemia, unspecified: Secondary | ICD-10-CM | POA: Diagnosis not present

## 2018-08-25 DIAGNOSIS — Z0100 Encounter for examination of eyes and vision without abnormal findings: Secondary | ICD-10-CM | POA: Diagnosis not present

## 2018-09-02 IMAGING — DX DG CHEST 2V
2 series · 2 of 2 positions shown · non-contrast
Comparison: July 19, 2017

CLINICAL DATA: Shortness of breath and cough

EXAM:
CHEST - 2 VIEW

[chest pa]
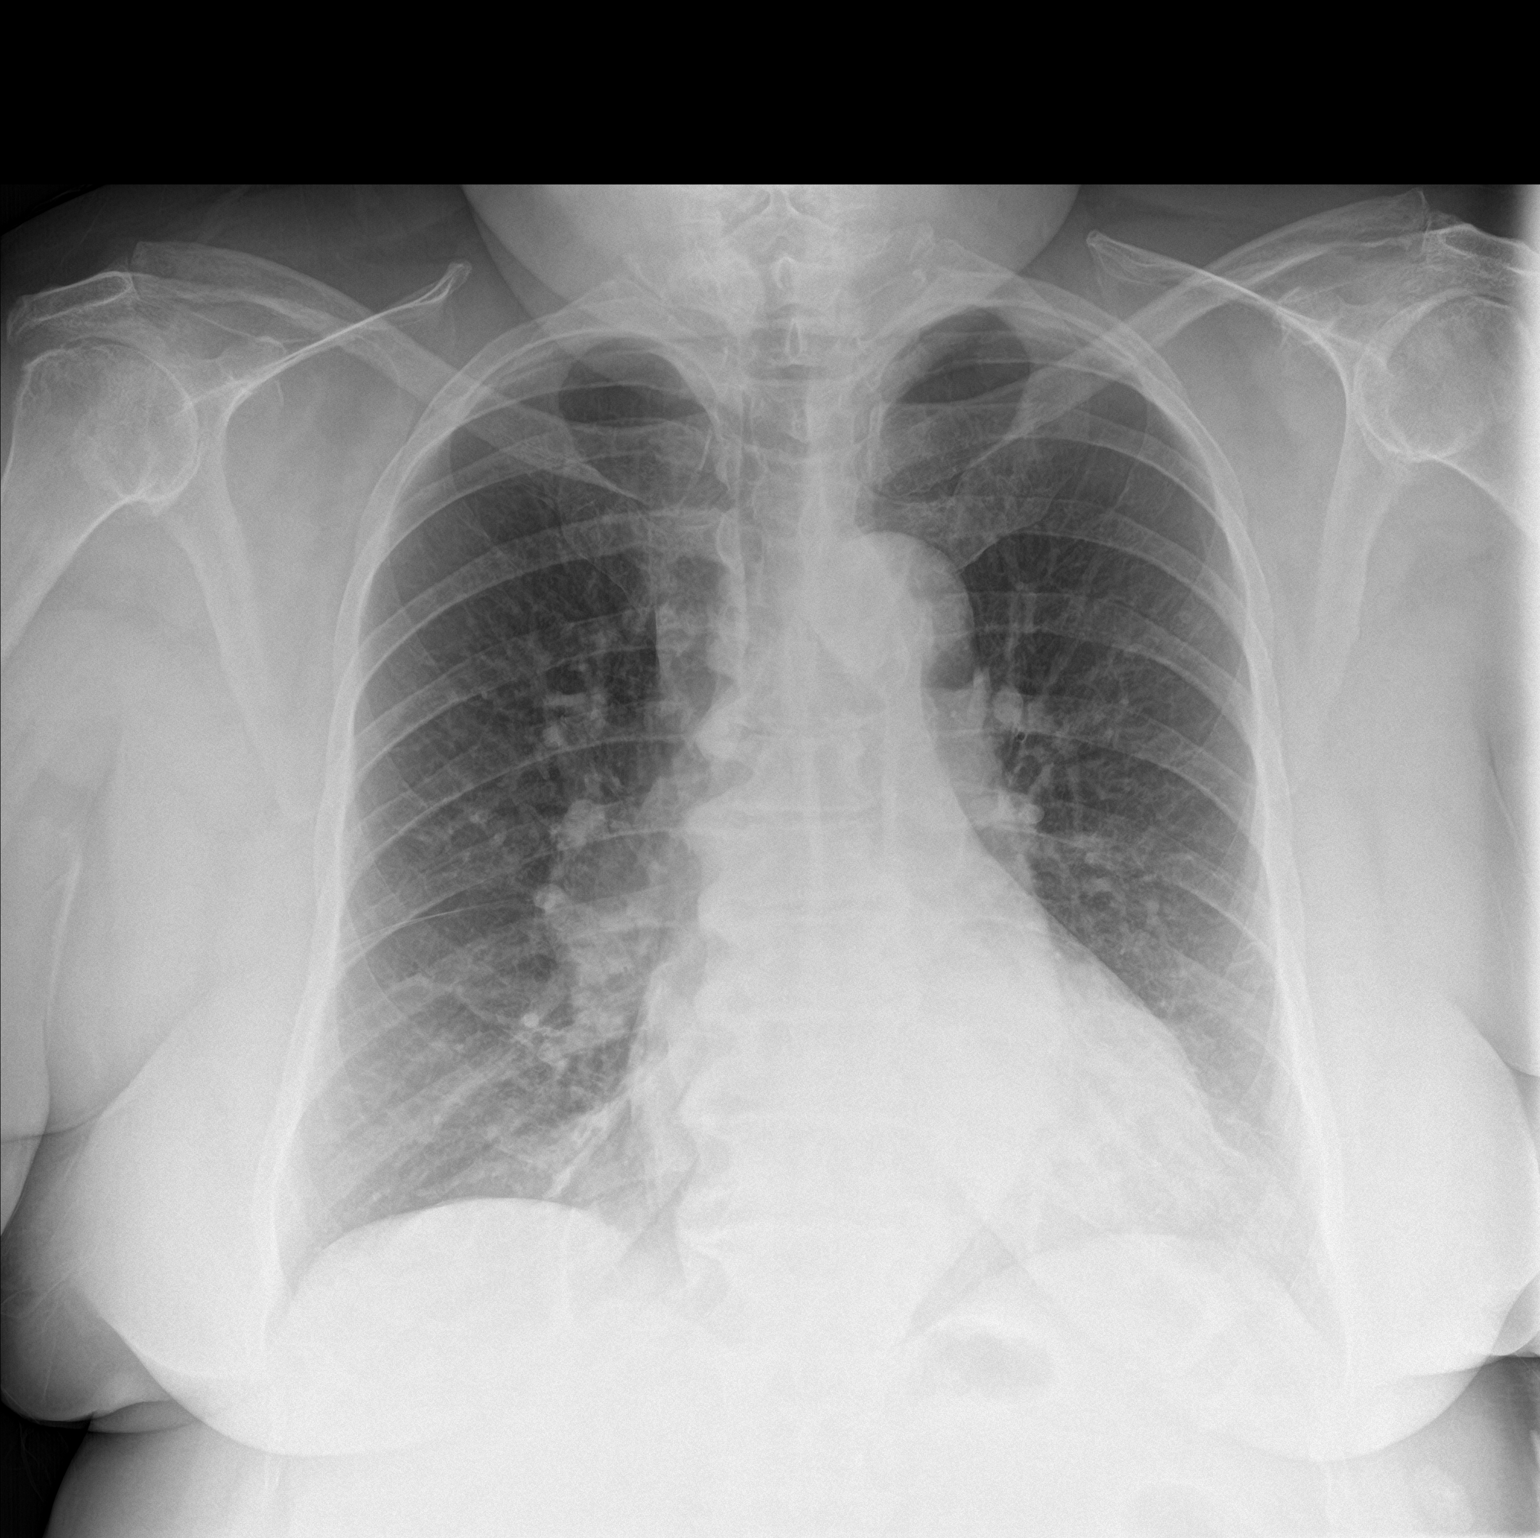

[chest lat]
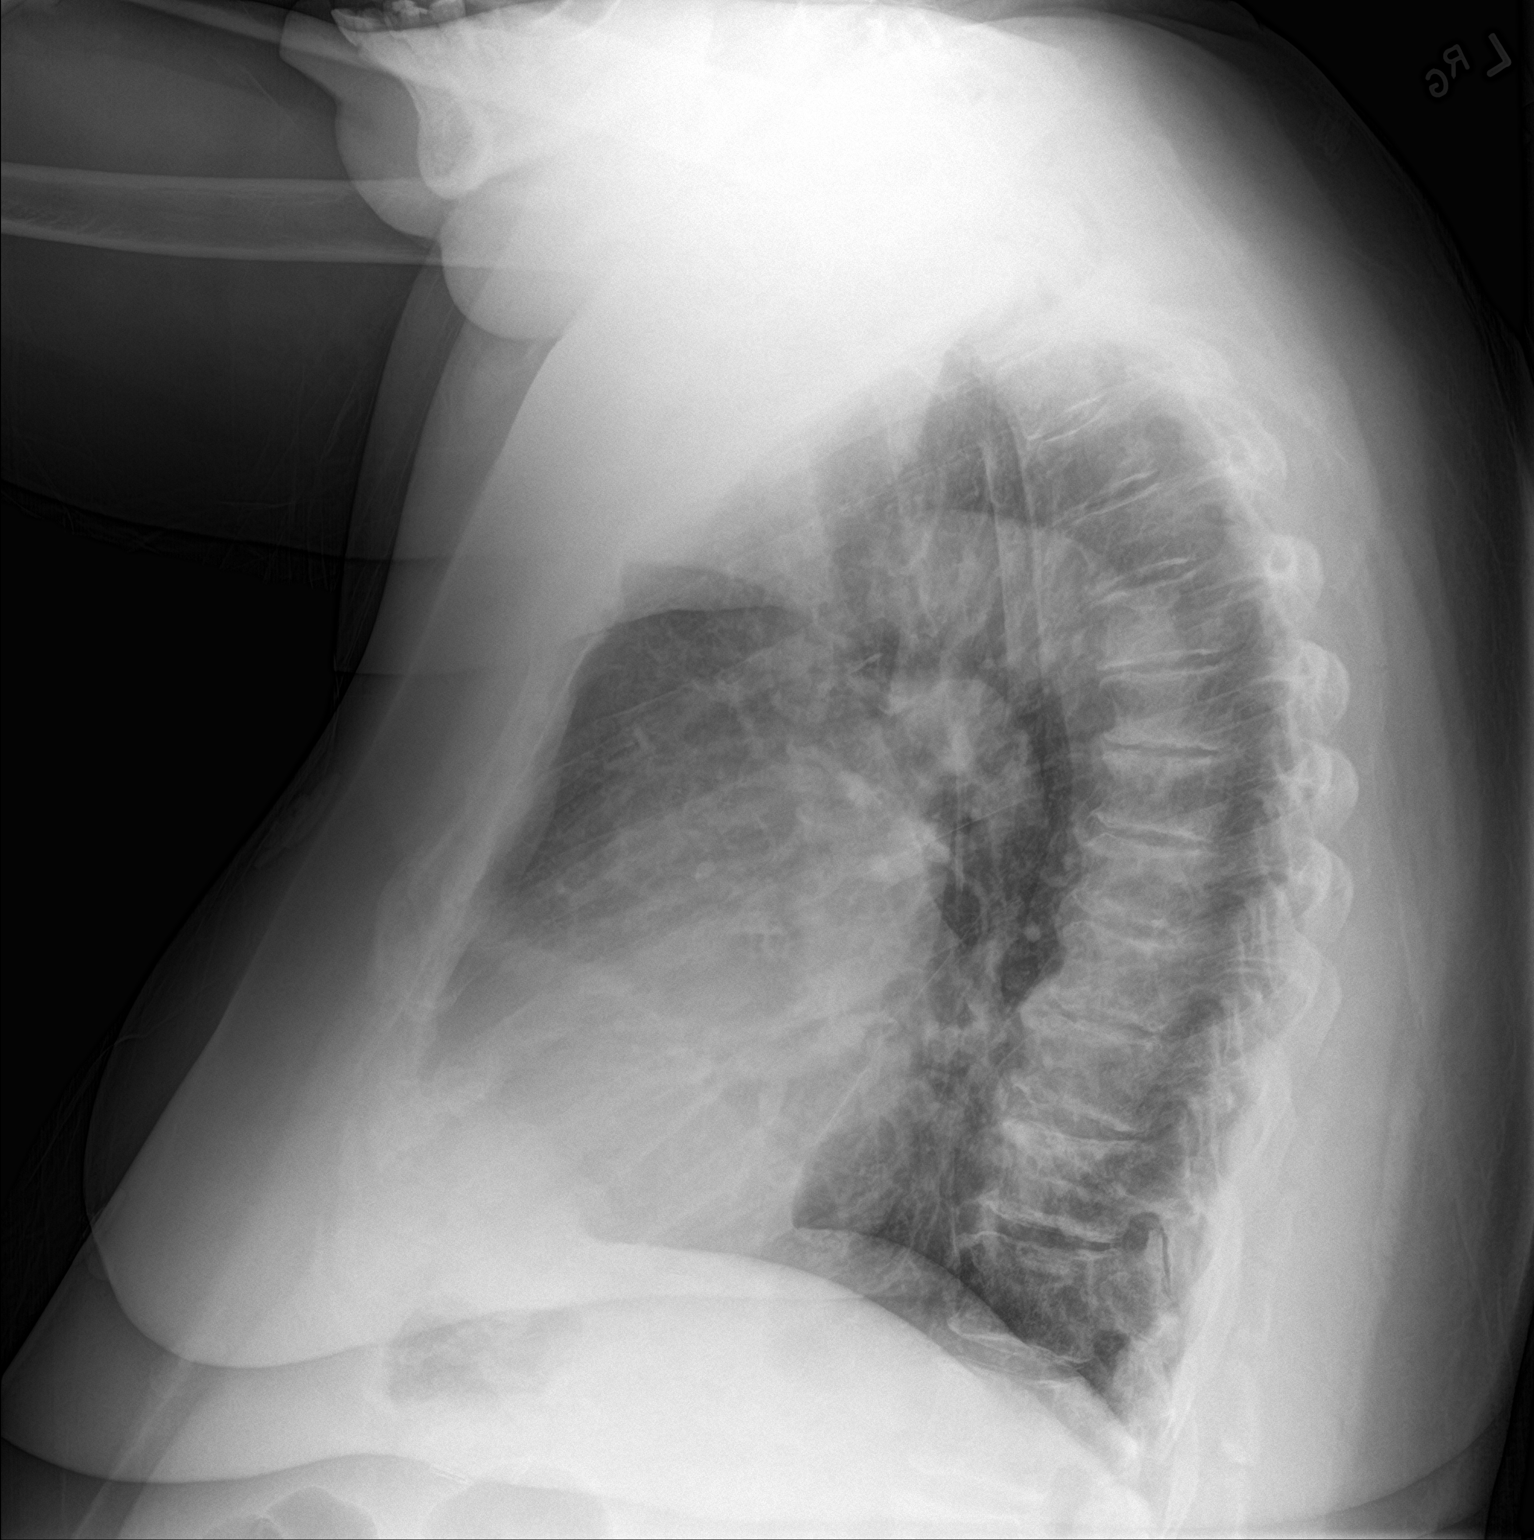

[2 of 2 positions shown; findings below may reference images not displayed]

FINDINGS: There is no edema or consolidation. The heart size and pulmonary
vascularity are normal. No adenopathy. There is degenerative change
in the thoracic spine.
IMPRESSION: No edema or consolidation.

## 2018-09-15 DIAGNOSIS — J45909 Unspecified asthma, uncomplicated: Secondary | ICD-10-CM | POA: Diagnosis not present

## 2018-09-22 DIAGNOSIS — J302 Other seasonal allergic rhinitis: Secondary | ICD-10-CM | POA: Diagnosis not present

## 2018-09-22 DIAGNOSIS — E1140 Type 2 diabetes mellitus with diabetic neuropathy, unspecified: Secondary | ICD-10-CM | POA: Diagnosis not present

## 2018-09-22 DIAGNOSIS — I10 Essential (primary) hypertension: Secondary | ICD-10-CM | POA: Diagnosis not present

## 2018-09-22 DIAGNOSIS — E1151 Type 2 diabetes mellitus with diabetic peripheral angiopathy without gangrene: Secondary | ICD-10-CM | POA: Diagnosis not present

## 2018-09-22 DIAGNOSIS — E785 Hyperlipidemia, unspecified: Secondary | ICD-10-CM | POA: Diagnosis not present

## 2018-10-09 DIAGNOSIS — L84 Corns and callosities: Secondary | ICD-10-CM | POA: Diagnosis not present

## 2018-10-09 DIAGNOSIS — B351 Tinea unguium: Secondary | ICD-10-CM | POA: Diagnosis not present

## 2018-10-09 DIAGNOSIS — M79672 Pain in left foot: Secondary | ICD-10-CM | POA: Diagnosis not present

## 2018-10-09 DIAGNOSIS — M79671 Pain in right foot: Secondary | ICD-10-CM | POA: Diagnosis not present

## 2018-10-09 DIAGNOSIS — E119 Type 2 diabetes mellitus without complications: Secondary | ICD-10-CM | POA: Diagnosis not present

## 2018-10-13 DIAGNOSIS — L03115 Cellulitis of right lower limb: Secondary | ICD-10-CM | POA: Diagnosis not present

## 2018-10-13 DIAGNOSIS — K5900 Constipation, unspecified: Secondary | ICD-10-CM | POA: Diagnosis not present

## 2018-10-13 DIAGNOSIS — I10 Essential (primary) hypertension: Secondary | ICD-10-CM | POA: Diagnosis not present

## 2018-10-16 DIAGNOSIS — J45909 Unspecified asthma, uncomplicated: Secondary | ICD-10-CM | POA: Diagnosis not present

## 2018-10-19 ENCOUNTER — Ambulatory Visit: Payer: Medicare Other | Admitting: Cardiology

## 2018-10-20 DIAGNOSIS — L0103 Bullous impetigo: Secondary | ICD-10-CM | POA: Diagnosis not present

## 2018-10-20 DIAGNOSIS — B957 Other staphylococcus as the cause of diseases classified elsewhere: Secondary | ICD-10-CM | POA: Diagnosis not present

## 2018-10-20 DIAGNOSIS — I10 Essential (primary) hypertension: Secondary | ICD-10-CM | POA: Diagnosis not present

## 2018-10-20 DIAGNOSIS — L138 Other specified bullous disorders: Secondary | ICD-10-CM | POA: Diagnosis not present

## 2018-10-20 DIAGNOSIS — L139 Bullous disorder, unspecified: Secondary | ICD-10-CM | POA: Diagnosis not present

## 2018-10-20 DIAGNOSIS — L120 Bullous pemphigoid: Secondary | ICD-10-CM | POA: Diagnosis not present

## 2018-10-24 ENCOUNTER — Telehealth: Payer: Self-pay

## 2018-10-24 ENCOUNTER — Ambulatory Visit (INDEPENDENT_AMBULATORY_CARE_PROVIDER_SITE_OTHER): Payer: Medicare Other | Admitting: Cardiology

## 2018-10-24 ENCOUNTER — Ambulatory Visit: Payer: Medicare Other | Admitting: Cardiology

## 2018-10-24 ENCOUNTER — Encounter: Payer: Self-pay | Admitting: Cardiology

## 2018-10-24 ENCOUNTER — Other Ambulatory Visit: Payer: Self-pay

## 2018-10-24 VITALS — BP 191/93 | HR 79 | Ht 67.0 in | Wt 258.0 lb

## 2018-10-24 DIAGNOSIS — I5032 Chronic diastolic (congestive) heart failure: Secondary | ICD-10-CM | POA: Diagnosis not present

## 2018-10-24 DIAGNOSIS — R000 Tachycardia, unspecified: Secondary | ICD-10-CM | POA: Diagnosis not present

## 2018-10-24 DIAGNOSIS — K5909 Other constipation: Secondary | ICD-10-CM | POA: Diagnosis not present

## 2018-10-24 DIAGNOSIS — R0789 Other chest pain: Secondary | ICD-10-CM | POA: Diagnosis not present

## 2018-10-24 DIAGNOSIS — Z955 Presence of coronary angioplasty implant and graft: Secondary | ICD-10-CM | POA: Diagnosis not present

## 2018-10-24 DIAGNOSIS — I491 Atrial premature depolarization: Secondary | ICD-10-CM | POA: Diagnosis not present

## 2018-10-24 DIAGNOSIS — I10 Essential (primary) hypertension: Secondary | ICD-10-CM

## 2018-10-24 DIAGNOSIS — I110 Hypertensive heart disease with heart failure: Secondary | ICD-10-CM | POA: Diagnosis not present

## 2018-10-24 DIAGNOSIS — I959 Hypotension, unspecified: Secondary | ICD-10-CM | POA: Diagnosis not present

## 2018-10-24 DIAGNOSIS — R1013 Epigastric pain: Secondary | ICD-10-CM | POA: Diagnosis not present

## 2018-10-24 DIAGNOSIS — Z79899 Other long term (current) drug therapy: Secondary | ICD-10-CM | POA: Diagnosis not present

## 2018-10-24 DIAGNOSIS — Z7951 Long term (current) use of inhaled steroids: Secondary | ICD-10-CM | POA: Diagnosis not present

## 2018-10-24 DIAGNOSIS — Z7984 Long term (current) use of oral hypoglycemic drugs: Secondary | ICD-10-CM | POA: Diagnosis not present

## 2018-10-24 DIAGNOSIS — I2510 Atherosclerotic heart disease of native coronary artery without angina pectoris: Secondary | ICD-10-CM

## 2018-10-24 DIAGNOSIS — I214 Non-ST elevation (NSTEMI) myocardial infarction: Secondary | ICD-10-CM | POA: Diagnosis not present

## 2018-10-24 DIAGNOSIS — R21 Rash and other nonspecific skin eruption: Secondary | ICD-10-CM | POA: Diagnosis not present

## 2018-10-24 DIAGNOSIS — G4733 Obstructive sleep apnea (adult) (pediatric): Secondary | ICD-10-CM | POA: Diagnosis not present

## 2018-10-24 DIAGNOSIS — I493 Ventricular premature depolarization: Secondary | ICD-10-CM | POA: Diagnosis not present

## 2018-10-24 DIAGNOSIS — K220 Achalasia of cardia: Secondary | ICD-10-CM | POA: Diagnosis not present

## 2018-10-24 DIAGNOSIS — R5381 Other malaise: Secondary | ICD-10-CM | POA: Diagnosis not present

## 2018-10-24 DIAGNOSIS — E119 Type 2 diabetes mellitus without complications: Secondary | ICD-10-CM | POA: Diagnosis not present

## 2018-10-24 DIAGNOSIS — R002 Palpitations: Secondary | ICD-10-CM | POA: Diagnosis not present

## 2018-10-24 DIAGNOSIS — E876 Hypokalemia: Secondary | ICD-10-CM | POA: Diagnosis not present

## 2018-10-24 DIAGNOSIS — L03115 Cellulitis of right lower limb: Secondary | ICD-10-CM | POA: Diagnosis not present

## 2018-10-24 DIAGNOSIS — K2270 Barrett's esophagus without dysplasia: Secondary | ICD-10-CM | POA: Diagnosis not present

## 2018-10-24 MED ORDER — BIDIL 20-37.5 MG PO TABS: 2.0000 | ORAL_TABLET | Freq: Three times a day (TID) | ORAL | 1 refills | Status: DC

## 2018-10-24 MED ORDER — AMLODIPINE BESYLATE 10 MG PO TABS: 10.0000 mg | ORAL_TABLET | Freq: Every day | ORAL | 3 refills | Status: DC

## 2018-10-24 NOTE — Progress Notes (Signed)
Primary Physician:  Norm SaltVanstory, Ashley N, PA   Patient ID: Angie Hill, female    DOB: October 23, 1940, 78 y.o.   MRN: [id number]  Subjective:    Chief Complaint  Patient presents with  . Palpitations  . Follow-up    HPI: Angie HoustonVersie M Burrough  is a 78 y.o. female  with hypertension, CAD s/p LAD PCI 2014, type 2 DM, OSA-not on CPAP, palpitations.  She underwent low risk nuclear stress test, low normal LVEF, mild MR, and TR, PVC and PAC's on event monitor, without atrial fibrillation.   She is here for acute visit for hypertension. She has noticed elevations in her BP recently. She has also been having frequent, generalized headaches. Denies any worsening shortness of breath. No chest pain. She is unsure of some of her medications today and did not bring them to her appt.   She has been diagnosed with OSA in the past, but does not use CPAP.  Past Medical History:  Diagnosis Date  . Anemia   . Angina pectoris (HCC)   . Arthritis   . Asthma   . COPD (chronic obstructive pulmonary disease) (HCC)   . Coronary artery disease   . Depression   . Diabetes mellitus without complication (HCC)    BORDERLINE  . GERD (gastroesophageal reflux disease)   . H/O hiatal hernia   . Hypertension   . Shortness of breath   . Sleep apnea     Past Surgical History:  Procedure Laterality Date  . ABDOMINAL HYSTERECTOMY     partial  . CORONARY ANGIOPLASTY WITH STENT PLACEMENT  07/04/2012   mid LAD  . LEFT AND RIGHT HEART CATHETERIZATION WITH CORONARY ANGIOGRAM N/A 07/04/2012   Procedure: LEFT AND RIGHT HEART CATHETERIZATION WITH CORONARY ANGIOGRAM;  Surgeon: Pamella PertJagadeesh R Ganji, MD;  [facility];  Service: Cardiovascular;  Laterality: N/A;  . LEFT HEART CATHETERIZATION WITH CORONARY ANGIOGRAM N/A 10/17/2012   Procedure: LEFT HEART CATHETERIZATION WITH CORONARY ANGIOGRAM;  Surgeon: Pamella PertJagadeesh R Ganji, MD;  [facility];  Service: Cardiovascular;  Laterality: N/A;  . PARTIAL COLECTOMY  1982   . PERCUTANEOUS CORONARY STENT INTERVENTION (PCI-S)  07/04/2012   Procedure: PERCUTANEOUS CORONARY STENT INTERVENTION (PCI-S);  Surgeon: Pamella PertJagadeesh R Ganji, MD;  [facility];  Service: Cardiovascular;;  . TUBAL LIGATION      Social History   Socioeconomic History  . Marital status: Single    Spouse name: Not on file  . Number of children: 5  . Years of education: Not on file  . Highest education level: Not on file  Occupational History  . Not on file  Social Needs  . Financial resource strain: Not on file  . Food insecurity    Worry: Not on file    Inability: Not on file  . Transportation needs    Medical: Not on file    Non-medical: Not on file  Tobacco Use  . Smoking status: Never Smoker  . Smokeless tobacco: Never Used  Substance and Sexual Activity  . Alcohol use: No  . Drug use: No  . Sexual activity: Not on file  Lifestyle  . Physical activity    Days per week: Not on file    Minutes per session: Not on file  . Stress: Not on file  Relationships  . Social Musicianconnections    Talks on phone: Not on file    Gets together: Not on file    Attends religious service: Not on file  Active member of club or organization: Not on file    Attends meetings of clubs or organizations: Not on file    Relationship status: Not on file  . Intimate partner violence    Fear of current or ex partner: Not on file    Emotionally abused: Not on file    Physically abused: Not on file    Forced sexual activity: Not on file  Other Topics Concern  . Not on file  Social History Narrative  . Not on file    Review of Systems  Constitution: Negative for decreased appetite, malaise/fatigue, weight gain and weight loss.  HENT: Negative for congestion.   Eyes: Negative for visual disturbance.  Cardiovascular: Positive for dyspnea on exertion. Negative for chest pain, leg swelling, palpitations and syncope.  Respiratory: Negative for shortness of breath.   Endocrine: Negative for  cold intolerance.  Hematologic/Lymphatic: Does not bruise/bleed easily.  Skin: Negative for itching and rash.  Musculoskeletal: Negative for myalgias.  Gastrointestinal: Negative for abdominal pain, nausea and vomiting.  Genitourinary: Negative for dysuria.  Neurological: Positive for headaches. Negative for dizziness and weakness.  Psychiatric/Behavioral: The patient is not nervous/anxious.   All other systems reviewed and are negative.     Objective:  Blood pressure (!) 191/93, pulse 79, height 5\' 7"  (1.702 m), weight 258 lb (117 kg), SpO2 94 %. Body mass index is 40.41 kg/m.    Physical Exam  Constitutional: She is oriented to person, place, and time. She appears well-developed and well-nourished. No distress.  HENT:  Head: Normocephalic and atraumatic.  Eyes: Pupils are equal, round, and reactive to light. Conjunctivae are normal.  Neck: No JVD present.  Cardiovascular: Normal rate, regular rhythm and intact distal pulses.  No murmur heard. Pulmonary/Chest: Effort normal and breath sounds normal. She has no wheezes. She has no rales.  Abdominal: Soft. Bowel sounds are normal. There is no rebound.  Musculoskeletal:        General: No edema.  Lymphadenopathy:    She has no cervical adenopathy.  Neurological: She is alert and oriented to person, place, and time. No cranial nerve deficit.  Skin: Skin is warm and dry.  Psychiatric: She has a normal mood and affect.  Nursing note and vitals reviewed.  Radiology: No results found.  Laboratory examination:    CMP Latest Ref Rng & Units 04/25/2018 04/18/2018 09/17/2017  Glucose 70 - 99 mg/dL 74 106(H) 119(H)  BUN 8 - 23 mg/dL 16 11 14   Creatinine 0.44 - 1.00 mg/dL 0.58 0.83 0.81  Sodium 135 - 145 mmol/L 141 147(H) 143  Potassium 3.5 - 5.1 mmol/L 3.4(L) 3.7 3.4(L)  Chloride 98 - 111 mmol/L 104 102 102  CO2 22 - 32 mmol/L 28 28 32  Calcium 8.9 - 10.3 mg/dL 9.5 10.1 9.1  Total Protein 6.5 - 8.1 g/dL 7.1 - -  Total Bilirubin 0.3 -  1.2 mg/dL 0.9 - -  Alkaline Phos 38 - 126 U/L 43 - -  AST 15 - 41 U/L 19 - -  ALT 0 - 44 U/L 23 - -   CBC Latest Ref Rng & Units 04/25/2018 09/17/2017 06/30/2016  WBC 4.0 - 10.5 K/uL 10.6(H) 10.5 7.9  Hemoglobin 12.0 - 15.0 g/dL 12.1 12.5 12.5  Hematocrit 36.0 - 46.0 % 39.4 38.4 39.1  Platelets 150 - 400 K/uL 345 277 321   Lipid Panel  No results found for: CHOL, TRIG, HDL, CHOLHDL, VLDL, LDLCALC, LDLDIRECT HEMOGLOBIN A1C No results found for: HGBA1C, MPG TSH No  results for input(s): TSH in the last 8760 hours.  PRN Meds:. Medications Discontinued During This Encounter  Medication Reason  . tiotropium (SPIRIVA) 18 MCG inhalation capsule Error  . Polyvinyl Alcohol-Povidone (REFRESH OP) Error  . polyethylene glycol (MIRALAX) packet Error  . nebivolol 20 MG TABS Error  . mirtazapine (REMERON) 15 MG tablet Error   Current Meds  Medication Sig  . albuterol (PROVENTIL HFA;VENTOLIN HFA) 108 (90 Base) MCG/ACT inhaler Inhale 2 puffs into the lungs 2 (two) times daily as needed for wheezing or shortness of breath.  . allopurinol (ZYLOPRIM) 300 MG tablet Take 300 mg by mouth daily.  Marland Kitchen. amlodipine-olmesartan (AZOR) 10-20 MG tablet Take 1 tablet by mouth daily.  Marland Kitchen. atorvastatin (LIPITOR) 80 MG tablet Take 80 mg by mouth daily.  Marland Kitchen. b complex vitamins tablet Take 1 tablet by mouth daily.  . budesonide-formoterol (SYMBICORT) 160-4.5 MCG/ACT inhaler Inhale 2 puffs into the lungs daily.  . celecoxib (CELEBREX) 100 MG capsule Take 100 mg by mouth daily.  . clobetasol cream (TEMOVATE) 0.05 % Apply 1 application topically daily. Apply to rash.  . Cyanocobalamin (VITAMIN B-12 PO) Take 1 tablet by mouth daily.   . furosemide (LASIX) 20 MG tablet 1 2TAB(S) ONCE A DAY ORALLY 90 DAY(S)  . gabapentin (NEURONTIN) 600 MG tablet Take 600 mg by mouth 3 (three) times daily.  Marland Kitchen. glipiZIDE (GLUCOTROL) 5 MG tablet Take by mouth daily before breakfast.  . hydrochlorothiazide (HYDRODIURIL) 25 MG tablet Take 1 tablet (25  mg total) by mouth daily.  Marland Kitchen. losartan (COZAAR) 100 MG tablet   . meloxicam (MOBIC) 7.5 MG tablet Take 7.5 mg by mouth daily.  . metoprolol tartrate (LOPRESSOR) 50 MG tablet Take 1 tablet by mouth daily.  . nitroGLYCERIN (NITROSTAT) 0.4 MG SL tablet Place 1 tablet (0.4 mg total) under the tongue every 5 (five) minutes as needed for chest pain.  . pantoprazole (PROTONIX) 40 MG tablet Take 40 mg by mouth daily.   . potassium chloride (K-DUR) 10 MEQ tablet Take 10 mEq by mouth 2 (two) times daily.  . predniSONE (DELTASONE) 10 MG tablet daily.  Marland Kitchen. PROAIR HFA 108 (90 Base) MCG/ACT inhaler as needed.  . sulfamethoxazole-trimethoprim (BACTRIM DS) 800-160 MG tablet Take 2 tablets by mouth daily.    Cardiac Studies:   48 hour holter monitor 04/07/2018:  Normal sinus rhythm. Min HR 60 and Max HR 103. Occasional PVC (4.3% burden) and rare PAC (<1% burden). No A fib or SVT noted. No heart block.  Lexiscan myoview stress test 06/06/2017:  1. Pharmacologic stress testing was performed with intravenous administration of .4 mg of Lexiscan over a 10-15 seconds infusion. Stress symptoms included chest tightness, dyspnea, dizziness, headache. Normal blood pressure. Exercise capacity not assessed. Stress EKG is non diagnostic for ischemia as it is a pharmacologic stress.  2. The overall quality of the study is fair. Review of the raw data in a rotational cine format reveals breast attenuation with imaging performed in sitting position. Attenuation is most pronounced on rest images. Left ventricular cavity is noted to be normal on the rest and stress studies. Gated SPECT images reveal normal myocardial thickening and wall motion. The left ventricular ejection fraction was calculated or visually estimated to be 65%. SPECT rest images demonstrate a large area of mild intensity perfusion defect in basal to apical inferior, inferoseptal and apical inferolateral myocardial walls. Stress SPECT images demonstrate only a  small area of mild intensity perfusion defect in apical inferior inferolateral myocardial walls. While perfusion defect  on rest images likely represent breast attenuation, perfusion defect, perfusion defect on stress images could represent small area of mild ischemia.  3. Low risk study.  Echocardiogram 04/12/2018: Left ventricle cavity is normal in size. Moderate concentric hypertrophy of the left ventricle. Low normal global wall motion. Visual EF is 50-55%. Doppler evidence of grade I (impaired) diastolic dysfunction, normal LAP. Calculated EF 48%. Mild (Grade I) mitral regurgitation. Mild tricuspid regurgitation. Estimated pulmonary artery systolic pressure 29 mmHg. No significant change compared to previous study on 06/15/2017.  Coronary angiogram 2014: Patent pLAD stent (4.0X22 mm Integrity BMS) Mid LAD mod stenosis. FFR 0.86 (Nonsignificant) LCx: Ostial 20-30% stenosis. Ostial AV grove 30% stenosis RCA: Mid tandem 20-30% stenosis and 40-50% stenoses  Assessment:     ICD-10-CM   1. Essential hypertension  I10   2. Coronary artery disease involving native coronary artery of native heart without angina pectoris  I25.10   3. Chronic heart failure with preserved ejection fraction (HCC)  I50.32    EKG 04/07/2018: Normal sinus rhythm with first degree AV block at a rate of 79 bpm, left atrial enlargement, frequent PAC's, normal axis. No evidence of ischemia. LVH. No significant change from EKG 08/05/2017.  Recommendations:   Patient seen today for acute visit for hypertension.  Her blood pressure was noted to be markedly elevated today, will start BiDil 1 tablet daily.  She did not bring her medications with her today and it appears that she may be out of her amlodipine, I have refilled this for her as well.  She has not had any symptoms of angina or worsening shortness of breath.  No clinical evidence of decompensated heart failure.  We will see her back in 2 weeks for close follow-up  on hypertension.  Toniann FailAshton Haynes Sudie Bandel, MSN, APRN, FNP-C [facility] Office: [phone] Fax: [phone]

## 2018-10-25 DIAGNOSIS — E876 Hypokalemia: Secondary | ICD-10-CM | POA: Diagnosis not present

## 2018-10-25 DIAGNOSIS — Z960 Presence of urogenital implants: Secondary | ICD-10-CM | POA: Diagnosis not present

## 2018-10-25 DIAGNOSIS — E11628 Type 2 diabetes mellitus with other skin complications: Secondary | ICD-10-CM | POA: Diagnosis not present

## 2018-10-25 DIAGNOSIS — R002 Palpitations: Secondary | ICD-10-CM | POA: Diagnosis not present

## 2018-10-25 DIAGNOSIS — I517 Cardiomegaly: Secondary | ICD-10-CM | POA: Diagnosis not present

## 2018-10-25 DIAGNOSIS — I491 Atrial premature depolarization: Secondary | ICD-10-CM | POA: Diagnosis not present

## 2018-10-25 DIAGNOSIS — I2510 Atherosclerotic heart disease of native coronary artery without angina pectoris: Secondary | ICD-10-CM | POA: Diagnosis not present

## 2018-10-25 DIAGNOSIS — I493 Ventricular premature depolarization: Secondary | ICD-10-CM | POA: Diagnosis not present

## 2018-10-25 DIAGNOSIS — R0789 Other chest pain: Secondary | ICD-10-CM | POA: Diagnosis not present

## 2018-10-25 DIAGNOSIS — L03115 Cellulitis of right lower limb: Secondary | ICD-10-CM | POA: Insufficient documentation

## 2018-10-25 DIAGNOSIS — G4733 Obstructive sleep apnea (adult) (pediatric): Secondary | ICD-10-CM | POA: Diagnosis not present

## 2018-10-25 DIAGNOSIS — I10 Essential (primary) hypertension: Secondary | ICD-10-CM | POA: Diagnosis not present

## 2018-10-25 DIAGNOSIS — R000 Tachycardia, unspecified: Secondary | ICD-10-CM | POA: Diagnosis not present

## 2018-10-26 DIAGNOSIS — E11628 Type 2 diabetes mellitus with other skin complications: Secondary | ICD-10-CM | POA: Diagnosis not present

## 2018-10-26 DIAGNOSIS — R000 Tachycardia, unspecified: Secondary | ICD-10-CM | POA: Diagnosis not present

## 2018-10-26 DIAGNOSIS — I5032 Chronic diastolic (congestive) heart failure: Secondary | ICD-10-CM | POA: Diagnosis not present

## 2018-10-26 DIAGNOSIS — L03115 Cellulitis of right lower limb: Secondary | ICD-10-CM | POA: Diagnosis not present

## 2018-10-26 DIAGNOSIS — I2510 Atherosclerotic heart disease of native coronary artery without angina pectoris: Secondary | ICD-10-CM | POA: Diagnosis not present

## 2018-10-26 DIAGNOSIS — G4733 Obstructive sleep apnea (adult) (pediatric): Secondary | ICD-10-CM | POA: Diagnosis not present

## 2018-10-26 DIAGNOSIS — Z955 Presence of coronary angioplasty implant and graft: Secondary | ICD-10-CM | POA: Diagnosis not present

## 2018-10-26 DIAGNOSIS — I10 Essential (primary) hypertension: Secondary | ICD-10-CM | POA: Diagnosis not present

## 2018-11-07 ENCOUNTER — Encounter: Payer: Self-pay | Admitting: Cardiology

## 2018-11-09 DIAGNOSIS — I10 Essential (primary) hypertension: Secondary | ICD-10-CM | POA: Diagnosis not present

## 2018-11-09 DIAGNOSIS — E1140 Type 2 diabetes mellitus with diabetic neuropathy, unspecified: Secondary | ICD-10-CM | POA: Diagnosis not present

## 2018-11-09 DIAGNOSIS — E785 Hyperlipidemia, unspecified: Secondary | ICD-10-CM | POA: Diagnosis not present

## 2018-11-09 DIAGNOSIS — J302 Other seasonal allergic rhinitis: Secondary | ICD-10-CM | POA: Diagnosis not present

## 2018-11-09 DIAGNOSIS — E1151 Type 2 diabetes mellitus with diabetic peripheral angiopathy without gangrene: Secondary | ICD-10-CM | POA: Diagnosis not present

## 2018-11-09 NOTE — Progress Notes (Signed)
Primary Physician:  Norm SaltVanstory, Ashley N, PA   Patient ID: Angie Hill [place], female    DOB: 05/29/1940, 78 y.o.   MRN: [id number]  Subjective:    Chief Complaint  Patient presents with  . Palpitations  . Chest Pain  . Hypertension    HPI: Angie Hill [place]  is a 78 y.o. female  with hypertension, CAD s/p LAD PCI 2014, type 2 DM, OSA-not on CPAP, palpitations. She has had low risk stress test in Mar 2019, echo showed   low normal LVEF, mild MR, and TR, PVC and PAC's on event monitor, without atrial fibrillation.   Patient was seen 3 weeks ago with headache, not feeling well and markedly elevated blood pressure and she was started on BiDil 1 tablet 3 times a day.  She now presents for follow-up.  States that blood pressure is improved significantly and she has started to feel better.  Has occasional palpitations and occasional chest pain but chest pain symptoms overall has markedly improved, states that previously or the past 3-4 weeks ago she was having frequent episodes of exertional chest tightness.  Past Medical History:  Diagnosis Date  . Anemia   . Angina pectoris (HCC)   . Arthritis   . Asthma   . COPD (chronic obstructive pulmonary disease) (HCC)   . Coronary artery disease   . Depression   . Diabetes mellitus without complication (HCC)    BORDERLINE  . GERD (gastroesophageal reflux disease)   . H/O hiatal hernia   . Hypertension   . Shortness of breath   . Sleep apnea     Past Surgical History:  Procedure Laterality Date  . ABDOMINAL HYSTERECTOMY     partial  . CORONARY ANGIOPLASTY WITH STENT PLACEMENT  07/04/2012   mid LAD  . LEFT AND RIGHT HEART CATHETERIZATION WITH CORONARY ANGIOGRAM N/A 07/04/2012   Procedure: LEFT AND RIGHT HEART CATHETERIZATION WITH CORONARY ANGIOGRAM;  Surgeon: Pamella PertJagadeesh R Briani Maul, MD;  [facility];  Service: Cardiovascular;  Laterality: N/A;  . LEFT HEART CATHETERIZATION WITH CORONARY ANGIOGRAM N/A 10/17/2012   Procedure: LEFT HEART  CATHETERIZATION WITH CORONARY ANGIOGRAM;  Surgeon: Pamella PertJagadeesh R Arabella Revelle, MD;  [facility];  Service: Cardiovascular;  Laterality: N/A;  . PARTIAL COLECTOMY  1982  . PERCUTANEOUS CORONARY STENT INTERVENTION (PCI-S)  07/04/2012   Procedure: PERCUTANEOUS CORONARY STENT INTERVENTION (PCI-S);  Surgeon: Pamella PertJagadeesh R Kateria Cutrona, MD;  [facility];  Service: Cardiovascular;;  . TUBAL LIGATION      Social History   Socioeconomic History  . Marital status: Single    Spouse name: Not on file  . Number of children: 5  . Years of education: Not on file  . Highest education level: Not on file  Occupational History  . Not on file  Social Needs  . Financial resource strain: Not on file  . Food insecurity    Worry: Not on file    Inability: Not on file  . Transportation needs    Medical: Not on file    Non-medical: Not on file  Tobacco Use  . Smoking status: Never Smoker  . Smokeless tobacco: Never Used  Substance and Sexual Activity  . Alcohol use: No  . Drug use: No  . Sexual activity: Not on file  Lifestyle  . Physical activity    Days per week: Not on file    Minutes per session: Not on file  . Stress: Not on file  Relationships  . Social connections  Talks on phone: Not on file    Gets together: Not on file    Attends religious service: Not on file    Active member of club or organization: Not on file    Attends meetings of clubs or organizations: Not on file    Relationship status: Not on file  . Intimate partner violence    Fear of current or ex partner: Not on file    Emotionally abused: Not on file    Physically abused: Not on file    Forced sexual activity: Not on file  Other Topics Concern  . Not on file  Social History Narrative  . Not on file    Review of Systems  Constitution: Negative for decreased appetite, malaise/fatigue, weight gain and weight loss.  HENT: Negative for congestion.   Eyes: Negative for visual disturbance.  Cardiovascular:  Positive for dyspnea on exertion and palpitations. Negative for chest pain, leg swelling and syncope.  Respiratory: Negative for shortness of breath.   Endocrine: Negative for cold intolerance.  Hematologic/Lymphatic: Does not bruise/bleed easily.  Skin: Negative for itching and rash.  Musculoskeletal: Positive for back pain and joint pain (shoulder, knee). Negative for myalgias.  Gastrointestinal: Negative for abdominal pain, nausea and vomiting.  Genitourinary: Negative for dysuria.  Neurological: Positive for headaches. Negative for dizziness and weakness.  Psychiatric/Behavioral: The patient is not nervous/anxious.   All other systems reviewed and are negative.     Objective:  Blood pressure 140/79, pulse (!) 53, height 5\' 6"  (1.676 Hill), weight 263 lb (119.3 kg), SpO2 (!) 81 %. Body mass index is 42.45 kg/Hill.    Physical Exam  Constitutional: She appears well-developed. No distress.  Morbidly obese  HENT:  Head: Atraumatic.  Eyes: Conjunctivae are normal.  Neck: Neck supple. No thyromegaly present.  Short neck and difficult to evaluate JVP  Cardiovascular: Normal rate, regular rhythm and normal heart sounds. Exam reveals no gallop.  No murmur heard. Pulses:      Carotid pulses are 2+ on the right side and 2+ on the left side.      Dorsalis pedis pulses are 2+ on the right side and 2+ on the left side.       Posterior tibial pulses are 2+ on the right side and 2+ on the left side.  Femoral and popliteal pulse difficult to feel due to patient's body habitus.  No edema  Pulmonary/Chest: Effort normal. She has wheezes (bilateral scattered expiratory wheezes).  Abdominal: Soft. Bowel sounds are normal.  Obese. Pannus present  Musculoskeletal: Normal range of motion.        General: No edema.  Neurological: She is alert.  Skin: Skin is warm and dry.  Psychiatric: She has a normal mood and affect.   Radiology: No results found.  Laboratory examination:   CMP Latest Ref Rng &  Units 04/25/2018 04/18/2018 09/17/2017  Glucose 70 - 99 mg/dL 74 031(R) 945(O)  BUN 8 - 23 mg/dL 16 11 14   Creatinine 0.44 - 1.00 mg/dL 5.92 9.24 4.62  Sodium 135 - 145 mmol/L 141 147(H) 143  Potassium 3.5 - 5.1 mmol/L 3.4(L) 3.7 3.4(L)  Chloride 98 - 111 mmol/L 104 102 102  CO2 22 - 32 mmol/L 28 28 32  Calcium 8.9 - 10.3 mg/dL 9.5 86.3 9.1  Total Protein 6.5 - 8.1 g/dL 7.1 - -  Total Bilirubin 0.3 - 1.2 mg/dL 0.9 - -  Alkaline Phos 38 - 126 U/L 43 - -  AST 15 - 41 U/L 19 - -  ALT 0 - 44 U/L 23 - -   CBC Latest Ref Rng & Units 04/25/2018 09/17/2017 06/30/2016  WBC 4.0 - 10.5 K/uL 10.6(H) 10.5 7.9  Hemoglobin 12.0 - 15.0 g/dL 12.1 12.5 12.5  Hematocrit 36.0 - 46.0 % 39.4 38.4 39.1  Platelets 150 - 400 K/uL 345 277 321   Lipid Panel  No results found for: CHOL, TRIG, HDL, CHOLHDL, VLDL, LDLCALC, LDLDIRECT HEMOGLOBIN A1C No results found for: HGBA1C, MPG TSH No results for input(s): TSH in the last 8760 hours.  PRN Meds:. Medications Discontinued During This Encounter  Medication Reason  . gabapentin (NEURONTIN) 300 MG capsule Error  . metoprolol tartrate (LOPRESSOR) 50 MG tablet Error  . predniSONE (DELTASONE) 10 MG tablet Error   Current Meds  Medication Sig  . albuterol (PROVENTIL HFA;VENTOLIN HFA) 108 (90 Base) MCG/ACT inhaler Inhale 2 puffs into the lungs 2 (two) times daily as needed for wheezing or shortness of breath.  Marland Kitchen amLODipine (NORVASC) 10 MG tablet Take 1 tablet (10 mg total) by mouth daily.  Marland Kitchen atorvastatin (LIPITOR) 80 MG tablet Take 80 mg by mouth daily.  Marland Kitchen b complex vitamins tablet Take 1 tablet by mouth daily.  . budesonide-formoterol (SYMBICORT) 160-4.5 MCG/ACT inhaler Inhale 2 puffs into the lungs daily.  . celecoxib (CELEBREX) 100 MG capsule Take 100 mg by mouth daily.  . cetirizine (ZYRTEC) 10 MG chewable tablet Chew 10 mg by mouth daily.  . clobetasol cream (TEMOVATE) 1.06 % Apply 1 application topically daily. Apply to rash.  . Cyanocobalamin (VITAMIN B-12 PO)  Take 1 tablet by mouth daily.   Marland Kitchen diltiazem (CARDIZEM CD) 180 MG 24 hr capsule daily.  . furosemide (LASIX) 20 MG tablet 1 2TAB(S) ONCE A DAY ORALLY 90 DAY(S)  . gabapentin (NEURONTIN) 600 MG tablet Take 600 mg by mouth 3 (three) times daily.  Marland Kitchen glipiZIDE (GLUCOTROL) 5 MG tablet Take by mouth daily before breakfast.  . hydrOXYzine (VISTARIL) 50 MG capsule Take 50 mg by mouth 4 (four) times daily as needed for itching.  . isosorbide-hydrALAZINE (BIDIL) 20-37.5 MG tablet Take 2 tablets by mouth 3 (three) times daily.  Marland Kitchen losartan (COZAAR) 100 MG tablet   . meloxicam (MOBIC) 7.5 MG tablet Take 7.5 mg by mouth daily.  . metoprolol succinate (TOPROL-XL) 100 MG 24 hr tablet Take by mouth daily.  . montelukast (SINGULAIR) 10 MG tablet daily.  . nitroGLYCERIN (NITROSTAT) 0.4 MG SL tablet Place 1 tablet (0.4 mg total) under the tongue every 5 (five) minutes as needed for chest pain.  . pantoprazole (PROTONIX) 40 MG tablet Take 40 mg by mouth daily.   . potassium chloride (K-DUR) 10 MEQ tablet Take 10 mEq by mouth 2 (two) times daily.  Marland Kitchen PROAIR HFA 108 (90 Base) MCG/ACT inhaler as needed.    Cardiac Studies:   Coronary angiogram 2014: Patent pLAD stent (4.0X22 mm Integrity BMS) Mid LAD mod stenosis. FFR 0.86 (Nonsignificant) LCx: Ostial 20-30% stenosis. Ostial AV grove 30% stenosis RCA: Mid tandem 20-30% stenosis and 40-50% stenoses.  48 hour holter monitor 04/07/2018:  Normal sinus rhythm. Min HR 60 and Max HR 103. Occasional PVC (4.3% burden) and rare PAC (<1% burden). No A fib or SVT noted. No heart block.  Echocardiogram 04/12/2018: Left ventricle cavity is normal in size. Moderate concentric hypertrophy of the left ventricle. Low normal global wall motion. Visual EF is 50-55%. Doppler evidence of grade I (impaired) diastolic dysfunction, normal LAP. Calculated EF 48%. Mild (Grade I) mitral regurgitation. Mild tricuspid regurgitation. Estimated pulmonary  artery systolic pressure 29 mmHg.  No significant change compared to previous study on 06/15/2017.  Lexiscan myoview stress test 06/06/2017:  1. Pharmacologic stress testing was performed with intravenous administration of .4 mg of Lexiscan over a 10-15 seconds infusion. Stress symptoms included chest tightness, dyspnea, dizziness, headache. Normal blood pressure. Exercise capacity not assessed. Stress EKG is non diagnostic for ischemia as it is a pharmacologic stress.  2. The overall quality of the study is fair. Review of the raw data in a rotational cine format reveals breast attenuation with imaging performed in sitting position. Attenuation is most pronounced on rest images. Left ventricular cavity is noted to be normal on the rest and stress studies. Gated SPECT images reveal normal myocardial thickening and wall motion. The left ventricular ejection fraction was calculated or visually estimated to be 65%. SPECT rest images demonstrate a large area of mild intensity perfusion defect in basal to apical inferior, inferoseptal and apical inferolateral myocardial walls. Stress SPECT images demonstrate only a small area of mild intensity perfusion defect in apical inferior inferolateral myocardial walls. While perfusion defect on rest images likely represent breast attenuation, perfusion defect, perfusion defect on stress images could represent small area of mild ischemia.  3. Low risk study.   Assessment:     ICD-10-CM   1. Essential hypertension  I10   2. Coronary artery disease involving native coronary artery of native heart without angina pectoris  I25.10   3. Chronic heart failure with preserved ejection fraction (HCC)  I50.32   4. Palpitations  R00.2    EKG 04/07/2018: Normal sinus rhythm with first degree AV block at a rate of 79 bpm, left atrial enlargement, frequent PAC's, normal axis. No evidence of ischemia. LVH. No significant change from EKG 08/05/2017.  Recommendations:   Patient presents here for three-month  office visit and follow-up of angina pectoris and also difficult to control hypertension.  Finally her blood pressure is well controlled.  Symptoms of palpitations and angina pectoris is also improved significantly.  No clinical evidence of congestive heart failure.  I given her positive reinforcement and again discussed regarding increasing her physical activity and also try to lose at least 10 pounds in weight.  Since last office visit she has gained about 12 pounds in weight.  Patient did bring all her medications and I encouraged her to do the same thing every time she comes to the physician's office.  Yates DecampJay Joci Dress, MD, [facility], [place]FACC 11/11/2018, 8:07 AM [facility]. [place] Pager: [phone] Office: [phone] If no answer Cell [phone]

## 2018-11-10 ENCOUNTER — Other Ambulatory Visit: Payer: Self-pay

## 2018-11-10 ENCOUNTER — Encounter: Payer: Self-pay | Admitting: Cardiology

## 2018-11-10 ENCOUNTER — Ambulatory Visit (INDEPENDENT_AMBULATORY_CARE_PROVIDER_SITE_OTHER): Payer: Medicare Other | Admitting: Cardiology

## 2018-11-10 VITALS — BP 140/79 | HR 53 | Ht 66.0 in | Wt 263.0 lb

## 2018-11-10 DIAGNOSIS — R002 Palpitations: Secondary | ICD-10-CM

## 2018-11-10 DIAGNOSIS — I2510 Atherosclerotic heart disease of native coronary artery without angina pectoris: Secondary | ICD-10-CM

## 2018-11-10 DIAGNOSIS — I5032 Chronic diastolic (congestive) heart failure: Secondary | ICD-10-CM | POA: Diagnosis not present

## 2018-11-10 DIAGNOSIS — I10 Essential (primary) hypertension: Secondary | ICD-10-CM | POA: Diagnosis not present

## 2018-11-16 DIAGNOSIS — J45909 Unspecified asthma, uncomplicated: Secondary | ICD-10-CM | POA: Diagnosis not present

## 2018-11-21 DIAGNOSIS — K824 Cholesterolosis of gallbladder: Secondary | ICD-10-CM | POA: Insufficient documentation

## 2018-12-16 DIAGNOSIS — J45909 Unspecified asthma, uncomplicated: Secondary | ICD-10-CM | POA: Diagnosis not present

## 2018-12-27 ENCOUNTER — Other Ambulatory Visit: Payer: Self-pay | Admitting: Cardiology

## 2019-01-10 DIAGNOSIS — E119 Type 2 diabetes mellitus without complications: Secondary | ICD-10-CM | POA: Diagnosis not present

## 2019-01-10 DIAGNOSIS — M79671 Pain in right foot: Secondary | ICD-10-CM | POA: Diagnosis not present

## 2019-01-10 DIAGNOSIS — M79672 Pain in left foot: Secondary | ICD-10-CM | POA: Diagnosis not present

## 2019-01-10 DIAGNOSIS — B351 Tinea unguium: Secondary | ICD-10-CM | POA: Diagnosis not present

## 2019-01-10 DIAGNOSIS — L84 Corns and callosities: Secondary | ICD-10-CM | POA: Diagnosis not present

## 2019-01-11 DIAGNOSIS — I10 Essential (primary) hypertension: Secondary | ICD-10-CM | POA: Diagnosis not present

## 2019-01-11 DIAGNOSIS — M109 Gout, unspecified: Secondary | ICD-10-CM | POA: Diagnosis not present

## 2019-01-11 DIAGNOSIS — E1151 Type 2 diabetes mellitus with diabetic peripheral angiopathy without gangrene: Secondary | ICD-10-CM | POA: Diagnosis not present

## 2019-01-11 DIAGNOSIS — E785 Hyperlipidemia, unspecified: Secondary | ICD-10-CM | POA: Diagnosis not present

## 2019-01-11 DIAGNOSIS — J302 Other seasonal allergic rhinitis: Secondary | ICD-10-CM | POA: Diagnosis not present

## 2019-01-11 DIAGNOSIS — E1140 Type 2 diabetes mellitus with diabetic neuropathy, unspecified: Secondary | ICD-10-CM | POA: Diagnosis not present

## 2019-01-16 DIAGNOSIS — J45909 Unspecified asthma, uncomplicated: Secondary | ICD-10-CM | POA: Diagnosis not present

## 2019-02-06 DIAGNOSIS — E785 Hyperlipidemia, unspecified: Secondary | ICD-10-CM | POA: Diagnosis not present

## 2019-02-06 DIAGNOSIS — E1151 Type 2 diabetes mellitus with diabetic peripheral angiopathy without gangrene: Secondary | ICD-10-CM | POA: Diagnosis not present

## 2019-02-06 DIAGNOSIS — E1140 Type 2 diabetes mellitus with diabetic neuropathy, unspecified: Secondary | ICD-10-CM | POA: Diagnosis not present

## 2019-02-06 DIAGNOSIS — I10 Essential (primary) hypertension: Secondary | ICD-10-CM | POA: Diagnosis not present

## 2019-02-06 DIAGNOSIS — J449 Chronic obstructive pulmonary disease, unspecified: Secondary | ICD-10-CM | POA: Diagnosis not present

## 2019-02-15 DIAGNOSIS — J45909 Unspecified asthma, uncomplicated: Secondary | ICD-10-CM | POA: Diagnosis not present

## 2019-02-19 ENCOUNTER — Other Ambulatory Visit: Payer: Self-pay

## 2019-02-19 ENCOUNTER — Ambulatory Visit (INDEPENDENT_AMBULATORY_CARE_PROVIDER_SITE_OTHER): Payer: Medicare Other | Admitting: Cardiology

## 2019-02-19 ENCOUNTER — Encounter: Payer: Self-pay | Admitting: Cardiology

## 2019-02-19 VITALS — BP 146/91 | HR 85 | Temp 97.7°F | Ht 66.0 in | Wt 269.0 lb

## 2019-02-19 DIAGNOSIS — I25118 Atherosclerotic heart disease of native coronary artery with other forms of angina pectoris: Secondary | ICD-10-CM

## 2019-02-19 DIAGNOSIS — I5032 Chronic diastolic (congestive) heart failure: Secondary | ICD-10-CM | POA: Diagnosis not present

## 2019-02-19 DIAGNOSIS — Z6841 Body Mass Index (BMI) 40.0 and over, adult: Secondary | ICD-10-CM

## 2019-02-19 DIAGNOSIS — I10 Essential (primary) hypertension: Secondary | ICD-10-CM

## 2019-02-19 MED ORDER — ISOSORBIDE MONONITRATE ER 30 MG PO TB24: 30.0000 mg | ORAL_TABLET | Freq: Every day | ORAL | 2 refills | Status: DC

## 2019-02-19 MED ORDER — HYDRALAZINE HCL 25 MG PO TABS: 25.0000 mg | ORAL_TABLET | Freq: Three times a day (TID) | ORAL | 2 refills | Status: DC

## 2019-02-19 NOTE — Progress Notes (Signed)
Primary Physician:  Trey Sailors, PA   Patient ID: Angie Hill, female    DOB: 1940-11-14, 78 y.o.   MRN: [id number]  Subjective:    Chief Complaint  Patient presents with   Chest Pain   Hypertension   Follow-up    41mo    HPI: Angie Hill  is a 78 y.o. female  with hypertension, CAD s/p LAD PCI 2014, type 2 DM, OSA-not on CPAP, now [facility], chronic palpitations much improved on metoprolol, event monitoring Jan 2020, that revealed normal atrial fibrillation, low risk stress test also in March 2019, low normal LVEF by echocardiogram presents here for 32-month follow-up of hypertension, dyspnea .  She has not had any angina pectoris. She continues to complain of mild palpitations and also continued fatigue and malaise.  She is having difficulty with weight loss.  No dizziness or syncope.  Past Medical History:  Diagnosis Date   Anemia    Angina pectoris (HCC)    Arthritis    Asthma    COPD (chronic obstructive pulmonary disease) ([place])    Coronary artery disease    Depression    Diabetes mellitus without complication (HCC)    BORDERLINE   GERD (gastroesophageal reflux disease)    H/O hiatal hernia    Hypertension    Shortness of breath    Sleep apnea     Past Surgical History:  Procedure Laterality Date   ABDOMINAL HYSTERECTOMY     partial   CORONARY ANGIOPLASTY WITH STENT PLACEMENT  07/04/2012   mid LAD   LEFT AND RIGHT HEART CATHETERIZATION WITH CORONARY ANGIOGRAM N/A 07/04/2012   Procedure: LEFT AND RIGHT HEART CATHETERIZATION WITH CORONARY ANGIOGRAM;  Surgeon: Laverda Page, MD;  Location[facility] CATH LAB;  Service: Cardiovascular;  Laterality: N/A;   LEFT HEART CATHETERIZATION WITH CORONARY ANGIOGRAM N/A 10/17/2012   Procedure: LEFT HEART CATHETERIZATION WITH CORONARY ANGIOGRAM;  Surgeon: Laverda Page, MD;  Location: [facility] CATH LAB;  Service: Cardiovascular;  Laterality: N/A;   PARTIAL COLECTOMY   1982   PERCUTANEOUS CORONARY STENT INTERVENTION (PCI-S)  07/04/2012   Procedure: PERCUTANEOUS CORONARY STENT INTERVENTION (PCI-S);  Surgeon: Laverda Page, MD;  Location: [facility] CATH LAB;  Service: Cardiovascular;;   TUBAL LIGATION      Social History   Socioeconomic History   Marital status: Single    Spouse name: Not on file   Number of children: 5   Years of education: Not on file   Highest education level: Not on file  Occupational History   Not on file  Social Needs   Financial resource strain: Not on file   Food insecurity    Worry: Not on file    Inability: Not on file   Transportation needs    Medical: Not on file    Non-medical: Not on file  Tobacco Use   Smoking status: Never Smoker   Smokeless tobacco: Never Used  Substance and Sexual Activity   Alcohol use: No   Drug use: No   Sexual activity: Not on file  Lifestyle   Physical activity    Days per week: Not on file    Minutes per session: Not on file   Stress: Not on file  Relationships   Social connections    Talks on phone: Not on file    Gets together: Not on file    Attends religious service: Not on file    Active member of club or  organization: Not on file    Attends meetings of clubs or organizations: Not on file    Relationship status: Not on file   Intimate partner violence    Fear of current or ex partner: Not on file    Emotionally abused: Not on file    Physically abused: Not on file    Forced sexual activity: Not on file  Other Topics Concern   Not on file  Social History Narrative   Not on file   Review of Systems  Constitution: Negative for decreased appetite, malaise/fatigue, weight gain and weight loss.  HENT: Negative for congestion.   Eyes: Negative for visual disturbance.  Cardiovascular: Positive for dyspnea on exertion, leg swelling (ankle) and palpitations. Negative for chest pain and syncope.  Respiratory: Negative for shortness of breath.   Endocrine:  Negative for cold intolerance.  Hematologic/Lymphatic: Does not bruise/bleed easily.  Skin: Negative for itching and rash.  Musculoskeletal: Positive for back pain and joint pain (shoulder, knee). Negative for myalgias.  Gastrointestinal: Negative for abdominal pain, nausea and vomiting.  Genitourinary: Negative for dysuria.  Neurological: Negative for dizziness and weakness.  Psychiatric/Behavioral: The patient is not nervous/anxious.   All other systems reviewed and are negative.  Objective:   Vitals with BMI 02/19/2019 11/10/2018 10/24/2018  Height 5\' 6"  5\' 6"  5\' 7"   Weight 269 lbs 263 lbs 258 lbs  BMI 43.44 42.47 40.4  Systolic 146 140  Diastolic 91 79 93  Pulse 85 53 79    Physical Exam  Constitutional: She appears well-developed. No distress.  Morbidly obese  HENT:  Head: Atraumatic.  Eyes: Conjunctivae are normal.  Neck: Neck supple. No thyromegaly present.  Short neck and difficult to evaluate JVP  Cardiovascular: Normal rate, regular rhythm and normal heart sounds. Exam reveals no gallop.  No murmur heard. Pulses:      Carotid pulses are 2+ on the right side and 2+ on the left side.      Dorsalis pedis pulses are 2+ on the right side and 2+ on the left side.       Posterior tibial pulses are 2+ on the right side and 2+ on the left side.  Femoral and popliteal pulse difficult to feel due to patient's body habitus.  No edema  Pulmonary/Chest: Effort normal. She has wheezes (bilateral scattered expiratory wheezes).  Abdominal: Soft. Bowel sounds are normal.  Obese. Pannus present  Musculoskeletal: Normal range of motion.        General: No edema.  Neurological: She is alert.  Skin: Skin is warm and dry.  Psychiatric: She has a normal mood and affect.   Radiology: No results found.  Laboratory examination:   CMP Latest Ref Rng & Units 04/25/2018 04/18/2018 09/17/2017  Glucose 70 - 99 mg/dL 74 06/24/2018) 06/17/2018)  BUN 8 - 23 mg/dL 16 11 14   Creatinine 0.44 - 1.00 mg/dL  11/18/2017 628(Z 662(H  Sodium 135 - 145 mmol/L 141 147(H) 143  Potassium 3.5 - 5.1 mmol/L 3.4(L) 3.7 3.4(L)  Chloride 98 - 111 mmol/L 104 102 102  CO2 22 - 32 mmol/L 28 28 32  Calcium 8.9 - 10.3 mg/dL 9.5 9.1  Total Protein 6.5 - 8.1 g/dL 7.1 - -  Total Bilirubin 0.3 - 1.2 mg/dL 0.9 - -  Alkaline Phos 38 - 126 U/L 43 - -  AST 15 - 41 U/L 19 - -  ALT 0 - 44 U/L 23 - -   CBC Latest Ref Rng & Units 04/25/2018  09/17/2017 06/30/2016  WBC 4.0 - 10.5 K/uL 10.6(H) 10.5 7.9  Hemoglobin 12.0 - 15.0 g/dL 16.1 09.6 04.5  Hematocrit 36.0 - 46.0 % 39.4 38.4 39.1  Platelets 150 - 400 K/uL 345 277 321   Lipid Panel  No results found for: CHOL, TRIG, HDL, CHOLHDL, VLDL, LDLCALC, LDLDIRECT HEMOGLOBIN A1C No results found for: HGBA1C, MPG TSH No results for input(s): TSH in the last 8760 hours.  PRN Meds:. Medications Discontinued During This Encounter  Medication Reason   diltiazem (CARDIZEM CD) 180 MG 24 hr capsule Side effect (s)   HYDROcodone-acetaminophen (NORCO/VICODIN) 5-325 MG per tablet Completed Course   HYDROcodone-acetaminophen (NORCO/VICODIN) 5-325 MG tablet Completed Course   meloxicam (MOBIC) 7.5 MG tablet Completed Course   montelukast (SINGULAIR) 10 MG tablet Patient Preference   Current Meds  Medication Sig   albuterol (PROVENTIL HFA;VENTOLIN HFA) 108 (90 Base) MCG/ACT inhaler Inhale 2 puffs into the lungs 2 (two) times daily as needed for wheezing or shortness of breath.   allopurinol (ZYLOPRIM) 300 MG tablet Take 300 mg by mouth as needed.    amLODipine (NORVASC) 10 MG tablet Take 1 tablet (10 mg total) by mouth daily.   aspirin EC 81 MG tablet Take 81 mg by mouth daily.   atorvastatin (LIPITOR) 80 MG tablet Take 80 mg by mouth daily.   b complex vitamins tablet Take 1 tablet by mouth daily.   BIDIL 20-37.5 MG tablet TAKE 2 TABLETS BY MOUTH 3 (THREE) TIMES DAILY.   budesonide-formoterol (SYMBICORT) 160-4.5 MCG/ACT inhaler Inhale 2 puffs into the lungs daily.    celecoxib (CELEBREX) 100 MG capsule Take 100 mg by mouth as needed.    cetirizine (ZYRTEC) 10 MG chewable tablet Chew 10 mg by mouth daily.   clobetasol cream (TEMOVATE) 0.05 % Apply 1 application topically as needed. Apply to rash.   Cyanocobalamin (VITAMIN B-12 PO) Take 1 tablet by mouth daily.    fluticasone (FLONASE) 50 MCG/ACT nasal spray Place into the nose as needed.   furosemide (LASIX) 20 MG tablet 1 2TAB(S) ONCE A DAY ORALLY 90 DAY(S)   gabapentin (NEURONTIN) 600 MG tablet Take 600 mg by mouth 2 (two) times daily.    glipiZIDE (GLUCOTROL) 5 MG tablet Take by mouth daily before breakfast.   hydrochlorothiazide (HYDRODIURIL) 25 MG tablet Take 1 tablet (25 mg total) by mouth daily.   hydrOXYzine (VISTARIL) 50 MG capsule Take 50 mg by mouth 4 (four) times daily as needed for itching.   losartan (COZAAR) 100 MG tablet    metoprolol succinate (TOPROL-XL) 100 MG 24 hr tablet Take by mouth daily.   nitroGLYCERIN (NITROSTAT) 0.4 MG SL tablet Place 1 tablet (0.4 mg total) under the tongue every 5 (five) minutes as needed for chest pain.   pantoprazole (PROTONIX) 40 MG tablet Take 40 mg by mouth daily.    potassium chloride (K-DUR) 10 MEQ tablet Take 10 mEq by mouth 2 (two) times daily.   PROAIR HFA 108 (90 Base) MCG/ACT inhaler as needed.    Cardiac Studies:   Coronary angiogram 2014: Patent pLAD stent (4.0X22 mm Integrity BMS) Mid LAD mod stenosis. FFR 0.86 (Nonsignificant) LCx: Ostial 20-30% stenosis. Ostial AV grove 30% stenosis RCA: Mid tandem 20-30% stenosis and 40-50% stenoses.  48 hour holter monitor 04/07/2018:  Normal sinus rhythm. Min HR 60 and Max HR 103. Occasional PVC (4.3% burden) and rare PAC (<1% burden). No A fib or SVT noted. No heart block.  Echocardiogram 04/12/2018: Left ventricle cavity is normal in size. Moderate concentric  hypertrophy of the left ventricle. Low normal global wall motion. Visual EF is 50-55%. Doppler evidence of grade I (impaired)  diastolic dysfunction, normal LAP. Calculated EF 48%. Mild (Grade I) mitral regurgitation. Mild tricuspid regurgitation. Estimated pulmonary artery systolic pressure 29 mmHg. No significant change compared to previous study on 06/15/2017.  Lexiscan myoview stress test 06/06/2017:  1. Pharmacologic stress testing was performed with intravenous administration of .4 mg of Lexiscan over a 10-15 seconds infusion. Stress symptoms included chest tightness, dyspnea, dizziness, headache. Normal blood pressure. Exercise capacity not assessed. Stress EKG is non diagnostic for ischemia as it is a pharmacologic stress.  2. The overall quality of the study is fair. Review of the raw data in a rotational cine format reveals breast attenuation with imaging performed in sitting position. Attenuation is most pronounced on rest images. Left ventricular cavity is noted to be normal on the rest and stress studies. Gated SPECT images reveal normal myocardial thickening and wall motion. The left ventricular ejection fraction was calculated or visually estimated to be 65%. SPECT rest images demonstrate a large area of mild intensity perfusion defect in basal to apical inferior, inferoseptal and apical inferolateral myocardial walls. Stress SPECT images demonstrate only a small area of mild intensity perfusion defect in apical inferior inferolateral myocardial walls. While perfusion defect on rest images likely represent breast attenuation, perfusion defect, perfusion defect on stress images could represent small area of mild ischemia.  3. Low risk study.  Chart a Assessment:     ICD-10-CM   1. Essential hypertension  I10   2. Coronary artery disease of native artery of native heart with stable angina pectoris (HCC)  I25.118 EKG 12-Lead  3. Chronic diastolic heart failure (HCC)  Z61.09   4. Class 3 severe obesity due to excess calories with serious comorbidity and body mass index (BMI) of 40.0 to 44.9 in adult [facility])   E66.01    Z68.41    EKG 02/19/2019: Sinus rhythm with first-degree block at rate of 86 bpm, normal axis.  No evidence of ischemia.  Voltage criteria for LVH.compared to 04/07/2018, frequent PACs not present.   Recommendations:   Angie Hill  is a 78 y.o.  female  with hypertension, CAD s/p LAD PCI 2014, type 2 DM, OSA-not on CPAP, now [facility], chronic palpitations much improved on metoprolol, event monitoring January 2020, that revealed normal atrial fibrillation, low risk stress test also in March 2019, low normal LVEF by echocardiogram presents here for 92-month follow-up of hypertension, dyspnea .  Patient presents here for three-month office visit and follow-up of angina pectoris and also difficult to control hypertension.  Symptoms of palpitations and angina pectoris is also improved significantly on Metoprolol, she has not been set for sleep study.  Today the EKG does not reveal frequent PACs.  No clinical evidence of congestive heart failure.  I given her positive reinforcement and again discussed regarding increasing her physical activity and also try to lose at least 10 pounds in weight.     Patient is planning on joining medications he thinks could be contributing to his balance issues weight loss program research study on Weight loss at [facility] "[employer]" ([employer]ATING [employer], VE[employer], EDUCATION AND STRENGTH TRAINING AND BONE HEALTH).  Encouraged her to join the same.  I have added isosorbide dinitrate 30 mg 3 times daily along with hydralazine 25 mg p.o. 3 times daily for hypertension control.  I would like to see her back in 6 weeks  for follow-up of hypertension.  Yates DecampJay Cyrilla Durkin, MD, [facility] MiamiFACC 02/19/2019, 10:01 AM [facility] [place] Pager: [phone] Office: [phone] If no answer Cell [phone]

## 2019-02-20 DIAGNOSIS — K5904 Chronic idiopathic constipation: Secondary | ICD-10-CM | POA: Diagnosis not present

## 2019-02-20 DIAGNOSIS — R1310 Dysphagia, unspecified: Secondary | ICD-10-CM | POA: Diagnosis not present

## 2019-02-20 DIAGNOSIS — Z1211 Encounter for screening for malignant neoplasm of colon: Secondary | ICD-10-CM | POA: Diagnosis not present

## 2019-02-20 DIAGNOSIS — R109 Unspecified abdominal pain: Secondary | ICD-10-CM | POA: Diagnosis not present

## 2019-03-02 DIAGNOSIS — I10 Essential (primary) hypertension: Secondary | ICD-10-CM | POA: Diagnosis not present

## 2019-03-02 DIAGNOSIS — Z23 Encounter for immunization: Secondary | ICD-10-CM | POA: Diagnosis not present

## 2019-03-02 DIAGNOSIS — E119 Type 2 diabetes mellitus without complications: Secondary | ICD-10-CM | POA: Diagnosis not present

## 2019-03-02 DIAGNOSIS — J45909 Unspecified asthma, uncomplicated: Secondary | ICD-10-CM | POA: Diagnosis not present

## 2019-03-02 DIAGNOSIS — Z7951 Long term (current) use of inhaled steroids: Secondary | ICD-10-CM | POA: Diagnosis not present

## 2019-03-02 DIAGNOSIS — R0602 Shortness of breath: Secondary | ICD-10-CM | POA: Diagnosis not present

## 2019-03-02 DIAGNOSIS — Z1159 Encounter for screening for other viral diseases: Secondary | ICD-10-CM | POA: Diagnosis not present

## 2019-03-06 DIAGNOSIS — E1151 Type 2 diabetes mellitus with diabetic peripheral angiopathy without gangrene: Secondary | ICD-10-CM | POA: Diagnosis not present

## 2019-03-06 DIAGNOSIS — E1140 Type 2 diabetes mellitus with diabetic neuropathy, unspecified: Secondary | ICD-10-CM | POA: Diagnosis not present

## 2019-03-06 DIAGNOSIS — E785 Hyperlipidemia, unspecified: Secondary | ICD-10-CM | POA: Diagnosis not present

## 2019-03-06 DIAGNOSIS — J449 Chronic obstructive pulmonary disease, unspecified: Secondary | ICD-10-CM | POA: Diagnosis not present

## 2019-03-06 DIAGNOSIS — I10 Essential (primary) hypertension: Secondary | ICD-10-CM | POA: Diagnosis not present

## 2019-03-07 DIAGNOSIS — E119 Type 2 diabetes mellitus without complications: Secondary | ICD-10-CM | POA: Diagnosis not present

## 2019-03-18 DIAGNOSIS — J45909 Unspecified asthma, uncomplicated: Secondary | ICD-10-CM | POA: Diagnosis not present

## 2019-04-02 ENCOUNTER — Ambulatory Visit: Payer: Medicare Other | Admitting: Cardiology

## 2019-04-02 ENCOUNTER — Ambulatory Visit (INDEPENDENT_AMBULATORY_CARE_PROVIDER_SITE_OTHER): Payer: Medicare Other | Admitting: Cardiology

## 2019-04-02 ENCOUNTER — Other Ambulatory Visit: Payer: Self-pay

## 2019-04-02 ENCOUNTER — Encounter: Payer: Self-pay | Admitting: Cardiology

## 2019-04-02 VITALS — BP 144/81 | HR 85 | Temp 97.3°F | Ht 67.0 in | Wt 266.2 lb

## 2019-04-02 DIAGNOSIS — R002 Palpitations: Secondary | ICD-10-CM

## 2019-04-02 DIAGNOSIS — I10 Essential (primary) hypertension: Secondary | ICD-10-CM

## 2019-04-02 DIAGNOSIS — Z6841 Body Mass Index (BMI) 40.0 and over, adult: Secondary | ICD-10-CM

## 2019-04-02 DIAGNOSIS — I25118 Atherosclerotic heart disease of native coronary artery with other forms of angina pectoris: Secondary | ICD-10-CM

## 2019-04-02 MED ORDER — HYDRALAZINE HCL 50 MG PO TABS: 50.0000 mg | ORAL_TABLET | Freq: Three times a day (TID) | ORAL | 2 refills | Status: DC

## 2019-04-02 MED ORDER — ISOSORBIDE DINITRATE 30 MG PO TABS: 30.0000 mg | ORAL_TABLET | Freq: Three times a day (TID) | ORAL | 2 refills | Status: DC

## 2019-04-02 NOTE — Progress Notes (Deleted)
Primary Physician:  Trey Sailors, PA   Patient ID: Angie Hill, female    DOB: [birth year], 79 y.o.   MRN: [id number]  Subjective:    No chief complaint on file.   HPI: Angie Hill  is a 79 y.o. female  with hypertension, CAD s/p LAD PCI 2014, type 2 DM, OSA-not on CPAP, now [facility], chronic palpitations much improved on metoprolol, event monitoring Jan 2020, that revealed normal atrial fibrillation, low risk stress test also in March 2019, low normal LVEF by echocardiogram presents here for 90-month follow-up of hypertension, dyspnea .  Presents for evaluation of hypertension, palpitations and angina. I had added ISDN and Hydralazine on her last OV. She has not had any angina pectoris. She continues to complain of mild palpitations and also continued fatigue and malaise.  She is having difficulty with weight loss.  No dizziness or syncope.  Past Medical History:  Diagnosis Date  . Anemia   . Angina pectoris ([place])   . Arthritis   . Asthma   . COPD (chronic obstructive pulmonary disease) ([place])   . Coronary artery disease   . Depression   . Diabetes mellitus without complication (HCC)    BORDERLINE  . GERD (gastroesophageal reflux disease)   . H/O hiatal hernia   . Hypertension   . Shortness of breath   . Sleep apnea     Past Surgical History:  Procedure Laterality Date  . ABDOMINAL HYSTERECTOMY     partial  . CORONARY ANGIOPLASTY WITH STENT PLACEMENT  07/04/2012   mid LAD  . LEFT AND RIGHT HEART CATHETERIZATION WITH CORONARY ANGIOGRAM N/A 07/04/2012   Procedure: LEFT AND RIGHT HEART CATHETERIZATION WITH CORONARY ANGIOGRAM;  Surgeon: Laverda Page, MD;  Location: [facility] CATH LAB;  Service: Cardiovascular;  Laterality: N/A;  . LEFT HEART CATHETERIZATION WITH CORONARY ANGIOGRAM N/A 10/17/2012   Procedure: LEFT HEART CATHETERIZATION WITH CORONARY ANGIOGRAM;  Surgeon: Laverda Page, MD;  Location: [facility] CATH LAB;  Service:  Cardiovascular;  Laterality: N/A;  . PARTIAL COLECTOMY  1982  . PERCUTANEOUS CORONARY STENT INTERVENTION (PCI-S)  07/04/2012   Procedure: PERCUTANEOUS CORONARY STENT INTERVENTION (PCI-S);  Surgeon: Laverda Page, MD;  Location: [facility] CATH LAB;  Service: Cardiovascular;;  . TUBAL LIGATION      Social History   Socioeconomic History  . Marital status: Single    Spouse name: Not on file  . Number of children: 5  . Years of education: Not on file  . Highest education level: Not on file  Occupational History  . Not on file  Tobacco Use  . Smoking status: Never Smoker  . Smokeless tobacco: Never Used  Substance and Sexual Activity  . Alcohol use: No  . Drug use: No  . Sexual activity: Not on file  Other Topics Concern  . Not on file  Social History Narrative  . Not on file   Social Determinants of Health   Financial Resource Strain:   . Difficulty of Paying Living Expenses: Not on file  Food Insecurity:   . Worried About Charity fundraiser in the Last Year: Not on file  . Ran Out of Food in the Last Year: Not on file  Transportation Needs:   . Lack of Transportation (Medical): Not on file  . Lack of Transportation (Non-Medical): Not on file  Physical Activity:   . Days of Exercise per Week: Not on file  . Minutes of Exercise per Session:  Not on file  Stress:   . Feeling of Stress : Not on file  Social Connections:   . Frequency of Communication with Friends and Family: Not on file  . Frequency of Social Gatherings with Friends and Family: Not on file  . Attends Religious Services: Not on file  . Active Member of Clubs or Organizations: Not on file  . Attends Banker Meetings: Not on file  . Marital Status: Not on file  Intimate Partner Violence:   . Fear of Current or Ex-Partner: Not on file  . Emotionally Abused: Not on file  . Physically Abused: Not on file  . Sexually Abused: Not on file   Review of Systems  Constitution: Negative for decreased  appetite, malaise/fatigue, weight gain and weight loss.  HENT: Negative for congestion.   Eyes: Negative for visual disturbance.  Cardiovascular: Positive for dyspnea on exertion, leg swelling (ankle) and palpitations. Negative for chest pain and syncope.  Respiratory: Negative for shortness of breath.   Endocrine: Negative for cold intolerance.  Hematologic/Lymphatic: Does not bruise/bleed easily.  Skin: Negative for itching and rash.  Musculoskeletal: Positive for back pain and joint pain (shoulder, knee). Negative for myalgias.  Gastrointestinal: Negative for abdominal pain, nausea and vomiting.  Genitourinary: Negative for dysuria.  Neurological: Negative for dizziness and weakness.  Psychiatric/Behavioral: The patient is not nervous/anxious.   All other systems reviewed and are negative.  Objective:   Vitals with BMI 02/19/2019 11/10/2018 10/24/2018  Height 5\' 6"  5\' 6"  5\' 7"   Weight 269 lbs 263 lbs 258 lbs  BMI 43.44 42.47 40.4  Systolic 146 140  Diastolic 91 79 93  Pulse 85 53 79    Physical Exam  Constitutional: She appears well-developed. No distress.  Morbidly obese  HENT:  Head: Atraumatic.  Eyes: Conjunctivae are normal.  Neck: No thyromegaly present.  Short neck and difficult to evaluate JVP  Cardiovascular: Normal rate, regular rhythm and normal heart sounds. Exam reveals no gallop.  No murmur heard. Pulses:      Carotid pulses are 2+ on the right side and 2+ on the left side.      Dorsalis pedis pulses are 2+ on the right side and 2+ on the left side.       Posterior tibial pulses are 2+ on the right side and 2+ on the left side.  Femoral and popliteal pulse difficult to feel due to patient's body habitus.  No edema  Pulmonary/Chest: Effort normal. She has wheezes (bilateral scattered expiratory wheezes).  Abdominal: Soft. Bowel sounds are normal.  Obese. Pannus present  Musculoskeletal:        General: No edema. Normal range of motion.     Cervical back:  Neck supple.  Neurological: She is alert.  Skin: Skin is warm and dry.  Psychiatric: She has a normal mood and affect.   Radiology: No results found.  Laboratory examination:   CMP Latest Ref Rng & Units 04/25/2018 04/18/2018 09/17/2017  Glucose 70 - 99 mg/dL 74 06/24/2018) 06/17/2018)  BUN 8 - 23 mg/dL 16 11 14   Creatinine 0.44 - 1.00 mg/dL 11/18/2017 468(E 321(Y  Sodium 135 - 145 mmol/L 141 147(H) 143  Potassium 3.5 - 5.1 mmol/L 3.4(L) 3.7 3.4(L)  Chloride 98 - 111 mmol/L 104 102 102  CO2 22 - 32 mmol/L 28 28 32  Calcium 8.9 - 10.3 mg/dL 9.5 9.1  Total Protein 6.5 - 8.1 g/dL 7.1 - -  Total Bilirubin 0.3 - 1.2 mg/dL 0.9 - -  Alkaline Phos 38 - 126 U/L 43 - -  AST 15 - 41 U/L 19 - -  ALT 0 - 44 U/L 23 - -   CBC Latest Ref Rng & Units 04/25/2018 09/17/2017 06/30/2016  WBC 4.0 - 10.5 K/uL 10.6(H) 10.5 7.9  Hemoglobin 12.0 - 15.0 g/dL 03.5 00.9 38.1  Hematocrit 36.0 - 46.0 % 39.4 38.4 39.1  Platelets 150 - 400 K/uL 345 277 321   Lipid Panel  No results found for: CHOL, TRIG, HDL, CHOLHDL, VLDL, LDLCALC, LDLDIRECT HEMOGLOBIN A1C No results found for: HGBA1C, MPG TSH No results for input(s): TSH in the last 8760 hours.  PRN Meds:. There are no discontinued medications. No outpatient medications have been marked as taking for the 04/02/19 encounter (Appointment) with Yates Decamp, MD.    Cardiac Studies:   Coronary angiogram 2014: Patent pLAD stent (4.0X22 mm Integrity BMS) Mid LAD mod stenosis. FFR 0.86 (Nonsignificant) LCx: Ostial 20-30% stenosis. Ostial AV grove 30% stenosis RCA: Mid tandem 20-30% stenosis and 40-50% stenoses.  48 hour holter monitor 04/07/2018:  Normal sinus rhythm. Min HR 60 and Max HR 103. Occasional PVC (4.3% burden) and rare PAC (<1% burden). No A fib or SVT noted. No heart block.  Echocardiogram 04/12/2018: Left ventricle cavity is normal in size. Moderate concentric hypertrophy of the left ventricle. Low normal global wall motion. Visual EF is 50-55%. Doppler  evidence of grade I (impaired) diastolic dysfunction, normal LAP. Calculated EF 48%. Mild (Grade I) mitral regurgitation. Mild tricuspid regurgitation. Estimated pulmonary artery systolic pressure 29 mmHg. No significant change compared to previous study on 06/15/2017.  Lexiscan myoview stress test 06/06/2017:  1. Pharmacologic stress testing was performed with intravenous administration of .4 mg of Lexiscan over a 10-15 seconds infusion. Stress symptoms included chest tightness, dyspnea, dizziness, headache. Normal blood pressure. Exercise capacity not assessed. Stress EKG is non diagnostic for ischemia as it is a pharmacologic stress.  2. The overall quality of the study is fair. Review of the raw data in a rotational cine format reveals breast attenuation with imaging performed in sitting position. Attenuation is most pronounced on rest images. Left ventricular cavity is noted to be normal on the rest and stress studies. Gated SPECT images reveal normal myocardial thickening and wall motion. The left ventricular ejection fraction was calculated or visually estimated to be 65%. SPECT rest images demonstrate a large area of mild intensity perfusion defect in basal to apical inferior, inferoseptal and apical inferolateral myocardial walls. Stress SPECT images demonstrate only a small area of mild intensity perfusion defect in apical inferior inferolateral myocardial walls. While perfusion defect on rest images likely represent breast attenuation, perfusion defect, perfusion defect on stress images could represent small area of mild ischemia.  3. Low risk study.  Chart a Assessment:     ICD-10-CM   1. Essential hypertension  I10   2. Palpitations  R00.2   3. Coronary artery disease of native artery of native heart with stable angina pectoris (HCC)  I25.118   4. Class 3 severe obesity due to excess calories with serious comorbidity and body mass index (BMI) of 40.0 to 44.9 in adult [facility])  E66.01     Z68.41    EKG 02/19/2019: Sinus rhythm with first-degree block at rate of 86 bpm, normal axis.  No evidence of ischemia.  Voltage criteria for LVH.compared to 04/07/2018, frequent PACs not present.   Recommendations:   Angie Hill  is a 79 y.o.  female  with hypertension, CAD s/p LAD  PCI 2014, type 2 DM, OSA-not on CPAP, now [facility], chronic palpitations much improved on metoprolol, event monitoring January 2020, that revealed normal atrial fibrillation, low risk stress test also in March 2019, low normal LVEF by echocardiogram presents here for 31-month follow-up of hypertension, dyspnea .  Patient presents here for three-month office visit and follow-up of angina pectoris and also difficult to control hypertension.  Symptoms of palpitations and angina pectoris is also improved significantly on Metoprolol, she has not been set for sleep study.  Today the EKG does not reveal frequent PACs.  No clinical evidence of congestive heart failure.  I given her positive reinforcement and again discussed regarding increasing her physical activity and also try to lose at least 10 pounds in weight.     Patient is planning on joining medications he thinks could be contributing to his balance issues weight loss program research study on Weight loss at [facility] "[employer]" (INCORPORATING NUTRITION, VESTS, EDUCATION AND STRENGTH TRAINING AND BONE HEALTH).  Encouraged her to join the same.  I have added isosorbide dinitrate 30 mg 3 times daily along with hydralazine 25 mg p.o. 3 times daily for hypertension control.  I would like to see her back in 6 weeks for follow-up of hypertension.  Yates Decamp, MD, [facility] 04/02/2019, 7:57 AM [facility] [place] Pager: [phone] Office: [phone] If no answer Cell [phone]

## 2019-04-02 NOTE — Patient Instructions (Signed)
Stop isosorbide mononitrate.  Start Isosorbide dinitrate one tablet three times daily.  Increase Hydralazine to 50 mg three times daily. New Rx sent to pharmacy for 50 mg tablets.   Loose weight and [employer] research for weight loss

## 2019-04-02 NOTE — Progress Notes (Signed)
Primary Physician:  Trey Sailors, PA   Patient ID: Angie Hill, female    DOB: [birth year], 79 y.o.   MRN: [id number]  Subjective:    Chief Complaint  Patient presents with  . Hypertension  . Follow-up    6wk  . Palpitations  . Chest Pain    HPI: Angie Hill  is a 79 y.o. female  with hypertension, CAD s/p LAD PCI 2014, type 2 DM, OSA-not on CPAP, now [facility], chronic palpitations much improved on metoprolol, event monitoring Jan 2020, that revealed normal atrial fibrillation, low risk stress test also in March 2019, low normal LVEF by echocardiogram. Last seen 6 weeks ago, in which she was started on Isosorbide dinitrate and hydralazine and now presents for follow up.   She has not had any angina pectoris. Continues to have palpitations, but overall improved since being on Metoprolol. She is having difficulty with weight loss.  No dizziness or syncope. No other new symptoms.  Tolerating all medications well.  Past Medical History:  Diagnosis Date  . Anemia   . Angina pectoris ([place])   . Arthritis   . Asthma   . COPD (chronic obstructive pulmonary disease) ([place])   . Coronary artery disease   . Depression   . Diabetes mellitus without complication (HCC)    BORDERLINE  . GERD (gastroesophageal reflux disease)   . H/O hiatal hernia   . Hypertension   . Shortness of breath   . Sleep apnea     Past Surgical History:  Procedure Laterality Date  . ABDOMINAL HYSTERECTOMY     partial  . CORONARY ANGIOPLASTY WITH STENT PLACEMENT  07/04/2012   mid LAD  . LEFT AND RIGHT HEART CATHETERIZATION WITH CORONARY ANGIOGRAM N/A 07/04/2012   Procedure: LEFT AND RIGHT HEART CATHETERIZATION WITH CORONARY ANGIOGRAM;  Surgeon: Laverda Page, MD;  Location: [facility] CATH LAB;  Service: Cardiovascular;  Laterality: N/A;  . LEFT HEART CATHETERIZATION WITH CORONARY ANGIOGRAM N/A 10/17/2012   Procedure: LEFT HEART CATHETERIZATION WITH CORONARY ANGIOGRAM;   Surgeon: Laverda Page, MD;  Location: [facility] CATH LAB;  Service: Cardiovascular;  Laterality: N/A;  . PARTIAL COLECTOMY  1982  . PERCUTANEOUS CORONARY STENT INTERVENTION (PCI-S)  07/04/2012   Procedure: PERCUTANEOUS CORONARY STENT INTERVENTION (PCI-S);  Surgeon: Laverda Page, MD;  Location: [facility] CATH LAB;  Service: Cardiovascular;;  . TUBAL LIGATION      Social History   Socioeconomic History  . Marital status: Single    Spouse name: Not on file  . Number of children: 5  . Years of education: Not on file  . Highest education level: Not on file  Occupational History  . Not on file  Tobacco Use  . Smoking status: Never Smoker  . Smokeless tobacco: Never Used  Substance and Sexual Activity  . Alcohol use: No  . Drug use: No  . Sexual activity: Not on file  Other Topics Concern  . Not on file  Social History Narrative  . Not on file   Social Determinants of Health   Financial Resource Strain:   . Difficulty of Paying Living Expenses: Not on file  Food Insecurity:   . Worried About Charity fundraiser in the Last Year: Not on file  . Ran Out of Food in the Last Year: Not on file  Transportation Needs:   . Lack of Transportation (Medical): Not on file  . Lack of Transportation (Non-Medical): Not on file  Physical Activity:   . Days of Exercise per Week: Not on file  . Minutes of Exercise per Session: Not on file  Stress:   . Feeling of Stress : Not on file  Social Connections:   . Frequency of Communication with Friends and Family: Not on file  . Frequency of Social Gatherings with Friends and Family: Not on file  . Attends Religious Services: Not on file  . Active Member of Clubs or Organizations: Not on file  . Attends Banker Meetings: Not on file  . Marital Status: Not on file  Intimate Partner Violence:   . Fear of Current or Ex-Partner: Not on file  . Emotionally Abused: Not on file  . Physically Abused: Not on file  . Sexually Abused: Not on  file   Review of Systems  Constitution: Negative for decreased appetite, malaise/fatigue, weight gain and weight loss.  HENT: Negative for congestion.   Eyes: Negative for visual disturbance.  Cardiovascular: Positive for dyspnea on exertion, leg swelling (ankle) and palpitations. Negative for chest pain and syncope.  Respiratory: Negative for shortness of breath.   Endocrine: Negative for cold intolerance.  Hematologic/Lymphatic: Does not bruise/bleed easily.  Skin: Negative for itching and rash.  Musculoskeletal: Positive for back pain and joint pain (shoulder, knee). Negative for myalgias.  Gastrointestinal: Negative for abdominal pain, nausea and vomiting.  Genitourinary: Negative for dysuria.  Neurological: Negative for dizziness and weakness.  Psychiatric/Behavioral: The patient is not nervous/anxious.   All other systems reviewed and are negative.  Objective:   Vitals with BMI 04/02/2019 02/19/2019 11/10/2018  Height 5\' 7"  5\' 6"  5\' 6"   Weight 266 lbs 3 oz 269 lbs 263 lbs  BMI 41.68 43.44 42.47  Systolic 144 146  Diastolic 81 91 79  Pulse 85 85 53    Physical Exam  Constitutional: She appears well-developed. No distress.  Morbidly obese  HENT:  Head: Atraumatic.  Eyes: Conjunctivae are normal.  Neck: No thyromegaly present.  Short neck and difficult to evaluate JVP  Cardiovascular: Normal rate, regular rhythm and normal heart sounds. Exam reveals no gallop.  No murmur heard. Pulses:      Carotid pulses are 2+ on the right side and 2+ on the left side.      Dorsalis pedis pulses are 2+ on the right side and 2+ on the left side.       Posterior tibial pulses are 2+ on the right side and 2+ on the left side.  Femoral and popliteal pulse difficult to feel due to patient's body habitus.  No edema  Pulmonary/Chest: Effort normal. She has wheezes (bilateral scattered expiratory wheezes).  Abdominal: Soft. Bowel sounds are normal.  Obese. Pannus present  Musculoskeletal:         General: No edema. Normal range of motion.     Cervical back: Neck supple.  Neurological: She is alert.  Skin: Skin is warm and dry.  Psychiatric: She has a normal mood and affect.   Radiology: No results found.  Laboratory examination:   CMP Latest Ref Rng & Units 04/25/2018 04/18/2018 09/17/2017  Glucose 70 - 99 mg/dL 74 06/24/2018) 06/17/2018)  BUN 8 - 23 mg/dL 16 11 14   Creatinine 0.44 - 1.00 mg/dL 11/18/2017 967(E 938(B  Sodium 135 - 145 mmol/L 141 147(H) 143  Potassium 3.5 - 5.1 mmol/L 3.4(L) 3.7 3.4(L)  Chloride 98 - 111 mmol/L 104 102 102  CO2 22 - 32 mmol/L 28 28 32  Calcium 8.9 - 10.3 mg/dL  9.5 10.1 9.1  Total Protein 6.5 - 8.1 g/dL 7.1 - -  Total Bilirubin 0.3 - 1.2 mg/dL 0.9 - -  Alkaline Phos 38 - 126 U/L 43 - -  AST 15 - 41 U/L 19 - -  ALT 0 - 44 U/L 23 - -   CBC Latest Ref Rng & Units 04/25/2018 09/17/2017 06/30/2016  WBC 4.0 - 10.5 K/uL 10.6(H) 10.5 7.9  Hemoglobin 12.0 - 15.0 g/dL 09.812.1 11.912.5 14.712.5  Hematocrit 36.0 - 46.0 % 39.4 38.4 39.1  Platelets 150 - 400 K/uL 345 277 321   Lipid Panel  No results found for: CHOL, TRIG, HDL, CHOLHDL, VLDL, LDLCALC, LDLDIRECT HEMOGLOBIN A1C No results found for: HGBA1C, MPG TSH No results for input(s): TSH in the last 8760 hours.  PRN Meds:. Medications Discontinued During This Encounter  Medication Reason  . isosorbide mononitrate (IMDUR) 30 MG 24 hr tablet Change in therapy  . hydrALAZINE (APRESOLINE) 25 MG tablet   . BIDIL 20-37.5 MG tablet Cost of medication   Current Meds  Medication Sig  . albuterol (PROVENTIL HFA;VENTOLIN HFA) 108 (90 Base) MCG/ACT inhaler Inhale 2 puffs into the lungs 2 (two) times daily as needed for wheezing or shortness of breath.  . allopurinol (ZYLOPRIM) 300 MG tablet Take 300 mg by mouth as needed.   Marland Kitchen. amLODipine (NORVASC) 10 MG tablet Take 1 tablet (10 mg total) by mouth daily. (Patient taking differently: Take 10 mg by mouth daily at 12 noon. At noon)  . aspirin EC 81 MG tablet Take 81 mg by mouth  daily.  Marland Kitchen. atorvastatin (LIPITOR) 80 MG tablet Take 80 mg by mouth at bedtime.   Marland Kitchen. b complex vitamins tablet Take 1 tablet by mouth daily.  . budesonide-formoterol (SYMBICORT) 160-4.5 MCG/ACT inhaler Inhale 2 puffs into the lungs daily.  . cetirizine (ZYRTEC) 10 MG chewable tablet Chew 10 mg by mouth daily.  . clobetasol cream (TEMOVATE) 0.05 % Apply 1 application topically as needed. Apply to rash.  . Cyanocobalamin (VITAMIN B-12 PO) Take 1 tablet by mouth daily.   . fluticasone (FLONASE) 50 MCG/ACT nasal spray Place into the nose as needed.  . furosemide (LASIX) 20 MG tablet Take 20 mg by mouth every other day.   . gabapentin (NEURONTIN) 600 MG tablet Take 600 mg by mouth at bedtime.   Marland Kitchen. glipiZIDE (GLUCOTROL) 5 MG tablet Take by mouth every other day.   . hydrALAZINE (APRESOLINE) 50 MG tablet Take 1 tablet (50 mg total) by mouth 3 (three) times daily.  . hydrOXYzine (VISTARIL) 50 MG capsule Take 50 mg by mouth 4 (four) times daily as needed for itching.  Marland Kitchen. LINZESS 72 MCG capsule Take 72 mcg by mouth daily.  Marland Kitchen. losartan (COZAAR) 100 MG tablet at bedtime.   . metoprolol succinate (TOPROL-XL) 100 MG 24 hr tablet Take by mouth daily.  . nitroGLYCERIN (NITROSTAT) 0.4 MG SL tablet Place 1 tablet (0.4 mg total) under the tongue every 5 (five) minutes as needed for chest pain.  . pantoprazole (PROTONIX) 40 MG tablet Take 40 mg by mouth daily.   Marland Kitchen. PROAIR HFA 108 (90 Base) MCG/ACT inhaler as needed.  . [DISCONTINUED] hydrALAZINE (APRESOLINE) 25 MG tablet Take 1 tablet (25 mg total) by mouth 3 (three) times daily.    Cardiac Studies:   Coronary angiogram 2014: Patent pLAD stent (4.0X22 mm Integrity BMS) Mid LAD mod stenosis. FFR 0.86 (Nonsignificant) LCx: Ostial 20-30% stenosis. Ostial AV grove 30% stenosis RCA: Mid tandem 20-30% stenosis and 40-50% stenoses.  48 hour holter monitor 04/07/2018:  Normal sinus rhythm. Min HR 60 and Max HR 103. Occasional PVC (4.3% burden) and rare PAC (<1% burden).  No A fib or SVT noted. No heart block.  Echocardiogram 04/12/2018: Left ventricle cavity is normal in size. Moderate concentric hypertrophy of the left ventricle. Low normal global wall motion. Visual EF is 50-55%. Doppler evidence of grade I (impaired) diastolic dysfunction, normal LAP. Calculated EF 48%. Mild (Grade I) mitral regurgitation. Mild tricuspid regurgitation. Estimated pulmonary artery systolic pressure 29 mmHg. No significant change compared to previous study on 06/15/2017.  Lexiscan myoview stress test 06/06/2017:  1. Pharmacologic stress testing was performed with intravenous administration of .4 mg of Lexiscan over a 10-15 seconds infusion. Stress symptoms included chest tightness, dyspnea, dizziness, headache. Normal blood pressure. Exercise capacity not assessed. Stress EKG is non diagnostic for ischemia as it is a pharmacologic stress.  2. The overall quality of the study is fair. Review of the raw data in a rotational cine format reveals breast attenuation with imaging performed in sitting position. Attenuation is most pronounced on rest images. Left ventricular cavity is noted to be normal on the rest and stress studies. Gated SPECT images reveal normal myocardial thickening and wall motion. The left ventricular ejection fraction was calculated or visually estimated to be 65%. SPECT rest images demonstrate a large area of mild intensity perfusion defect in basal to apical inferior, inferoseptal and apical inferolateral myocardial walls. Stress SPECT images demonstrate only a small area of mild intensity perfusion defect in apical inferior inferolateral myocardial walls. While perfusion defect on rest images likely represent breast attenuation, perfusion defect, perfusion defect on stress images could represent small area of mild ischemia.  3. Low risk study.  Chart a Assessment:     ICD-10-CM   1. Coronary artery disease of native artery of native heart with stable  angina pectoris (HCC)  I25.118   2. Essential hypertension  I10 hydrALAZINE (APRESOLINE) 50 MG tablet    isosorbide dinitrate (ISORDIL) 30 MG tablet  3. Palpitations  R00.2   4. Class 3 severe obesity due to excess calories with serious comorbidity and body mass index (BMI) of 40.0 to 44.9 in adult [facility])  E66.01    Z68.41    EKG 02/19/2019: Sinus rhythm with first-degree block at rate of 86 bpm, normal axis.  No evidence of ischemia.  Voltage criteria for LVH.compared to 04/07/2018, frequent PACs not present.   Recommendations:   Angie Hill  is a 79 y.o.  female  with hypertension, CAD s/p LAD PCI 2014, type 2 DM, OSA-not on CPAP, now [facility], chronic palpitations much improved on metoprolol, event monitoring January 2020, that revealed normal atrial fibrillation, low risk stress test also in March 2019, low normal LVEF by echocardiogram. Last seen 6 weeks ago, in which she was started on Isosorbide dinitrate and hydralazine and now presents for follow up.   Patient presents here for one-month office visit and follow-up of angina pectoris, palpitations and also difficult to control hypertension. Symptoms of palpitations are still persisting but improved on beta blocker therapy.  No clinical evidence of congestive heart failure.  Patient was contemplating joining weight loss program research study on Weight loss at [facility] [employer]" ([employer] NUTR[employer], VESTS, EDUCATION AND STRENGTH TRAINING AND BONE HEALTH) but did not join due to [place]. I have encouraged her to join this.   Encouraged her to join the same.  I have added isosorbide dinitrate 30 mg  3 times daily along with hydralazine 50 mg p.o. 3 times daily for hypertension control, Will discontinue Imdur.  I would like to see her back in 3 months for follow-up of hypertension, Palpitations and angina.  Yates Decamp, MD, [facility] 04/02/2019, 10:57 AM [facility]. [facility]

## 2019-04-04 ENCOUNTER — Other Ambulatory Visit: Payer: Self-pay

## 2019-04-04 ENCOUNTER — Telehealth (HOSPITAL_COMMUNITY): Payer: Self-pay

## 2019-04-04 DIAGNOSIS — I872 Venous insufficiency (chronic) (peripheral): Secondary | ICD-10-CM

## 2019-04-04 NOTE — Telephone Encounter (Signed)

## 2019-04-05 ENCOUNTER — Ambulatory Visit (HOSPITAL_COMMUNITY)
Admission: RE | Admit: 2019-04-05 | Discharge: 2019-04-05 | Disposition: A | Discharge status: Home / Self Care | Payer: Medicare Other | Source: Ambulatory Visit | Attending: Vascular Surgery | Admitting: Vascular Surgery

## 2019-04-05 ENCOUNTER — Other Ambulatory Visit: Payer: Self-pay

## 2019-04-05 ENCOUNTER — Encounter: Payer: Self-pay | Admitting: Vascular Surgery

## 2019-04-05 ENCOUNTER — Ambulatory Visit (INDEPENDENT_AMBULATORY_CARE_PROVIDER_SITE_OTHER): Payer: Medicare Other | Admitting: Vascular Surgery

## 2019-04-05 VITALS — BP 146/84 | HR 70 | Temp 97.3°F | Resp 20 | Ht 67.0 in | Wt 268.0 lb

## 2019-04-05 DIAGNOSIS — I87303 Chronic venous hypertension (idiopathic) without complications of bilateral lower extremity: Secondary | ICD-10-CM

## 2019-04-05 DIAGNOSIS — I872 Venous insufficiency (chronic) (peripheral): Secondary | ICD-10-CM | POA: Diagnosis not present

## 2019-04-05 NOTE — Progress Notes (Signed)
Referring: Norva Riffle PA  Patient name: Angie Hill MRN: [id number] DOB: 14-Jul-1940 Sex: female  REASON FOR CONSULT: leg swelling  HPI: Angie Hill is a 79 y.o. female, with a 1 year history of a variety of aches and pains left leg worse than the right with pain extending from the left hip all the way down into the left calf.  She does not really describe claudication.  She occasionally gets some leg swelling.  She has not ever really worn compression stockings.  She has no prior history of DVT.  She is concerned about some darkish discoloration of the skin on her right medial calf.  She develops numbness and tingling in her toes on occasion.  She has a history of peripheral neuropathy secondary to her diabetes.  She is on a statin and aspirin.  She apparently was also placed on Lasix recently for leg swelling.  Other medical problems include obesity, diabetes, hypertension, sleep apnea, coronary artery disease, COPD all of which have been stable.  Past Medical History:  Diagnosis Date  . Anemia   . Angina pectoris (HCC)   . Arthritis   . Asthma   . COPD (chronic obstructive pulmonary disease) (HCC)   . Coronary artery disease   . Depression   . Diabetes mellitus without complication (HCC)    BORDERLINE  . GERD (gastroesophageal reflux disease)   . H/O hiatal hernia   . Hypertension   . Shortness of breath   . Sleep apnea    Past Surgical History:  Procedure Laterality Date  . ABDOMINAL HYSTERECTOMY     partial  . CORONARY ANGIOPLASTY WITH STENT PLACEMENT  07/04/2012   mid LAD  . LEFT AND RIGHT HEART CATHETERIZATION WITH CORONARY ANGIOGRAM N/A 07/04/2012   Procedure: LEFT AND RIGHT HEART CATHETERIZATION WITH CORONARY ANGIOGRAM;  Surgeon: Pamella Pert, MD;  Location: [facility] CATH LAB;  Service: Cardiovascular;  Laterality: N/A;  . LEFT HEART CATHETERIZATION WITH CORONARY ANGIOGRAM N/A 10/17/2012   Procedure: LEFT HEART CATHETERIZATION WITH CORONARY ANGIOGRAM;  Surgeon:  Pamella Pert, MD;  Location: [facility] CATH LAB;  Service: Cardiovascular;  Laterality: N/A;  . PARTIAL COLECTOMY  1982  . PERCUTANEOUS CORONARY STENT INTERVENTION (PCI-S)  07/04/2012   Procedure: PERCUTANEOUS CORONARY STENT INTERVENTION (PCI-S);  Surgeon: Pamella Pert, MD;  Location: [facility] CATH LAB;  Service: Cardiovascular;;  . TUBAL LIGATION      Family History  Problem Relation Age of Onset  . Hyperlipidemia Sister   . Heart attack Sister   . Diabetes Daughter   . Stroke Father   . Heart attack Father   . Stroke Sister     SOCIAL HISTORY: Social History   Socioeconomic History  . Marital status: Single    Spouse name: Not on file  . Number of children: 5  . Years of education: Not on file  . Highest education level: Not on file  Occupational History  . Not on file  Tobacco Use  . Smoking status: Never Smoker  . Smokeless tobacco: Never Used  Substance and Sexual Activity  . Alcohol use: No  . Drug use: No  . Sexual activity: Not on file  Other Topics Concern  . Not on file  Social History Narrative  . Not on file   Social Determinants of Health   Financial Resource Strain:   . Difficulty of Paying Living Expenses: Not on file  Food Insecurity:   . Worried About Programme researcher, broadcasting/film/video in the Last  Year: Not on file  . Ran Out of Food in the Last Year: Not on file  Transportation Needs:   . Lack of Transportation (Medical): Not on file  . Lack of Transportation (Non-Medical): Not on file  Physical Activity:   . Days of Exercise per Week: Not on file  . Minutes of Exercise per Session: Not on file  Stress:   . Feeling of Stress : Not on file  Social Connections:   . Frequency of Communication with Friends and Family: Not on file  . Frequency of Social Gatherings with Friends and Family: Not on file  . Attends Religious Services: Not on file  . Active Member of Clubs or Organizations: Not on file  . Attends Banker Meetings: Not on file  . Marital  Status: Not on file  Intimate Partner Violence:   . Fear of Current or Ex-Partner: Not on file  . Emotionally Abused: Not on file  . Physically Abused: Not on file  . Sexually Abused: Not on file    Allergies  Allergen Reactions  . Ciprofloxacin Itching and Rash  . Erythromycin Itching and Rash    Current Outpatient Medications  Medication Sig Dispense Refill  . albuterol (PROVENTIL HFA;VENTOLIN HFA) 108 (90 Base) MCG/ACT inhaler Inhale 2 puffs into the lungs 2 (two) times daily as needed for wheezing or shortness of breath. 1 Inhaler 0  . allopurinol (ZYLOPRIM) 300 MG tablet Take 300 mg by mouth as needed.     Marland Kitchen aspirin EC 81 MG tablet Take 81 mg by mouth daily.    Marland Kitchen atorvastatin (LIPITOR) 80 MG tablet Take 80 mg by mouth at bedtime.     Marland Kitchen b complex vitamins tablet Take 1 tablet by mouth daily.    . budesonide-formoterol (SYMBICORT) 160-4.5 MCG/ACT inhaler Inhale 2 puffs into the lungs daily.    . celecoxib (CELEBREX) 100 MG capsule Take 100 mg by mouth as needed.     . cetirizine (ZYRTEC) 10 MG chewable tablet Chew 10 mg by mouth daily.    . clobetasol cream (TEMOVATE) 0.05 % Apply 1 application topically as needed. Apply to rash.    . Cyanocobalamin (VITAMIN B-12 PO) Take 1 tablet by mouth daily.     . fluticasone (FLONASE) 50 MCG/ACT nasal spray Place into the nose as needed.    . furosemide (LASIX) 20 MG tablet Take 20 mg by mouth every other day.     . gabapentin (NEURONTIN) 600 MG tablet Take 600 mg by mouth at bedtime.     Marland Kitchen glipiZIDE (GLUCOTROL) 5 MG tablet Take by mouth every other day.     . hydrALAZINE (APRESOLINE) 50 MG tablet Take 1 tablet (50 mg total) by mouth 3 (three) times daily. 90 tablet 2  . hydrochlorothiazide (HYDRODIURIL) 25 MG tablet Take 1 tablet (25 mg total) by mouth daily. 30 tablet 4  . hydrOXYzine (VISTARIL) 50 MG capsule Take 50 mg by mouth 4 (four) times daily as needed for itching.    . isosorbide dinitrate (ISORDIL) 30 MG tablet Take 1 tablet (30  mg total) by mouth 3 (three) times daily. 90 tablet 2  . LINZESS 72 MCG capsule Take 72 mcg by mouth daily.    Marland Kitchen losartan (COZAAR) 100 MG tablet at bedtime.     . metoprolol succinate (TOPROL-XL) 100 MG 24 hr tablet Take by mouth daily.    . nitroGLYCERIN (NITROSTAT) 0.4 MG SL tablet Place 1 tablet (0.4 mg total) under the tongue every 5 (five)  minutes as needed for chest pain. 25 tablet 3  . pantoprazole (PROTONIX) 40 MG tablet Take 40 mg by mouth daily.     . potassium chloride (K-DUR) 10 MEQ tablet Take 10 mEq by mouth 2 (two) times daily.    Marland Kitchen PROAIR HFA 108 (90 Base) MCG/ACT inhaler as needed.    Marland Kitchen amLODipine (NORVASC) 10 MG tablet Take 1 tablet (10 mg total) by mouth daily. (Patient taking differently: Take 10 mg by mouth daily at 12 noon. At noon) 180 tablet 3   No current facility-administered medications for this visit.    ROS:   General:  No weight loss, Fever, chills  HEENT: No recent headaches, no nasal bleeding, no visual changes, no sore throat  Neurologic: No dizziness, blackouts, seizures. No recent symptoms of stroke or mini- stroke. No recent episodes of slurred speech, or temporary blindness.  Cardiac: No recent episodes of chest pain/pressure, no shortness of breath at rest.  + shortness of breath with exertion.  Denies history of atrial fibrillation or irregular heartbeat  Vascular: No history of rest pain in feet.  No history of claudication.  No history of non-healing ulcer, No history of DVT   Pulmonary: No home oxygen, no productive cough, no hemoptysis,  No asthma or wheezing  Musculoskeletal:  [ ]  Arthritis, [ ]  Low back pain,  [ ]  Joint pain  Hematologic:No history of hypercoagulable state.  No history of easy bleeding.  No history of anemia  Gastrointestinal: No hematochezia or melena,  No gastroesophageal reflux, no trouble swallowing  Urinary: [ ]  chronic Kidney disease, [ ]  on HD - [ ]  MWF or [ ]  TTHS, [ ]  Burning with urination, [ ]  Frequent  urination, [ ]  Difficulty urinating;   Skin: No rashes  Psychological: No history of anxiety,  No history of depression   Physical Examination  Vitals:   04/05/19 0837  BP: (!) 146/84  Pulse: 70  Resp: 20  Temp: (!) 97.3 F (36.3 C)  SpO2: 99%  Weight: 268 lb (121.6 kg)  Height: 5\' 7"  (1.702 m)    Body mass index is 41.97 kg/m.  General:  Alert and oriented, no acute distress HEENT: Normal Neck: No JVD Cardiac: Regular Rate and Rhythm Abdomen: Soft, non-tender, obese Skin: No rash, hemosiderin staining right gaiter area encompassing about 7 x 5 cm, no ulceration Extremity Pulses:  2+ radial, brachial, femoral, dorsalis pedis pulses bilaterally Musculoskeletal: No deformity trace bilateral pretibial edema  Neurologic: Upper and lower extremity motor 5/5 and symmetric  DATA:  Patient had a venous duplex today which showed no evidence of DVT.  She had no reflux in the deep or superficial veins.  ASSESSMENT: Skin changes right leg bilateral lower extremity swelling most likely secondary to venous hypertension from her obesity.  She also has some complaints that are probably related to her neuropathy.  She has no significant arterial or venous pathology.   PLAN: Patient was counseled today in weight loss and an exercise program of walking 30 minutes daily as well as dietary changes to try to lose some weight which should improve 75% of her symptoms.  Venous hypertension usually improves and 75% of cases with weight loss.  This was discussed with the patient today.  I also discussed with the patient today getting compression stockings.  She is going to obtain these on her own and wear them during the daytime from the time she wakes up in the morning until she goes to bed in the evening.  She will try to elevate her legs as much as possible.  She will follow up with me on as-needed basis.   Fabienne Bruns, MD [facility] [place] Office:  [phone] Pager: [phone]

## 2019-04-10 IMAGING — CR DG CHEST 2V
2 series · 2 of 2 positions shown · non-contrast
Comparison: 03/25/2018

CLINICAL DATA: Pt having cough,fever and congestion for a
week,nonsmoker

EXAM:
CHEST - 2 VIEW

[w chest pa]
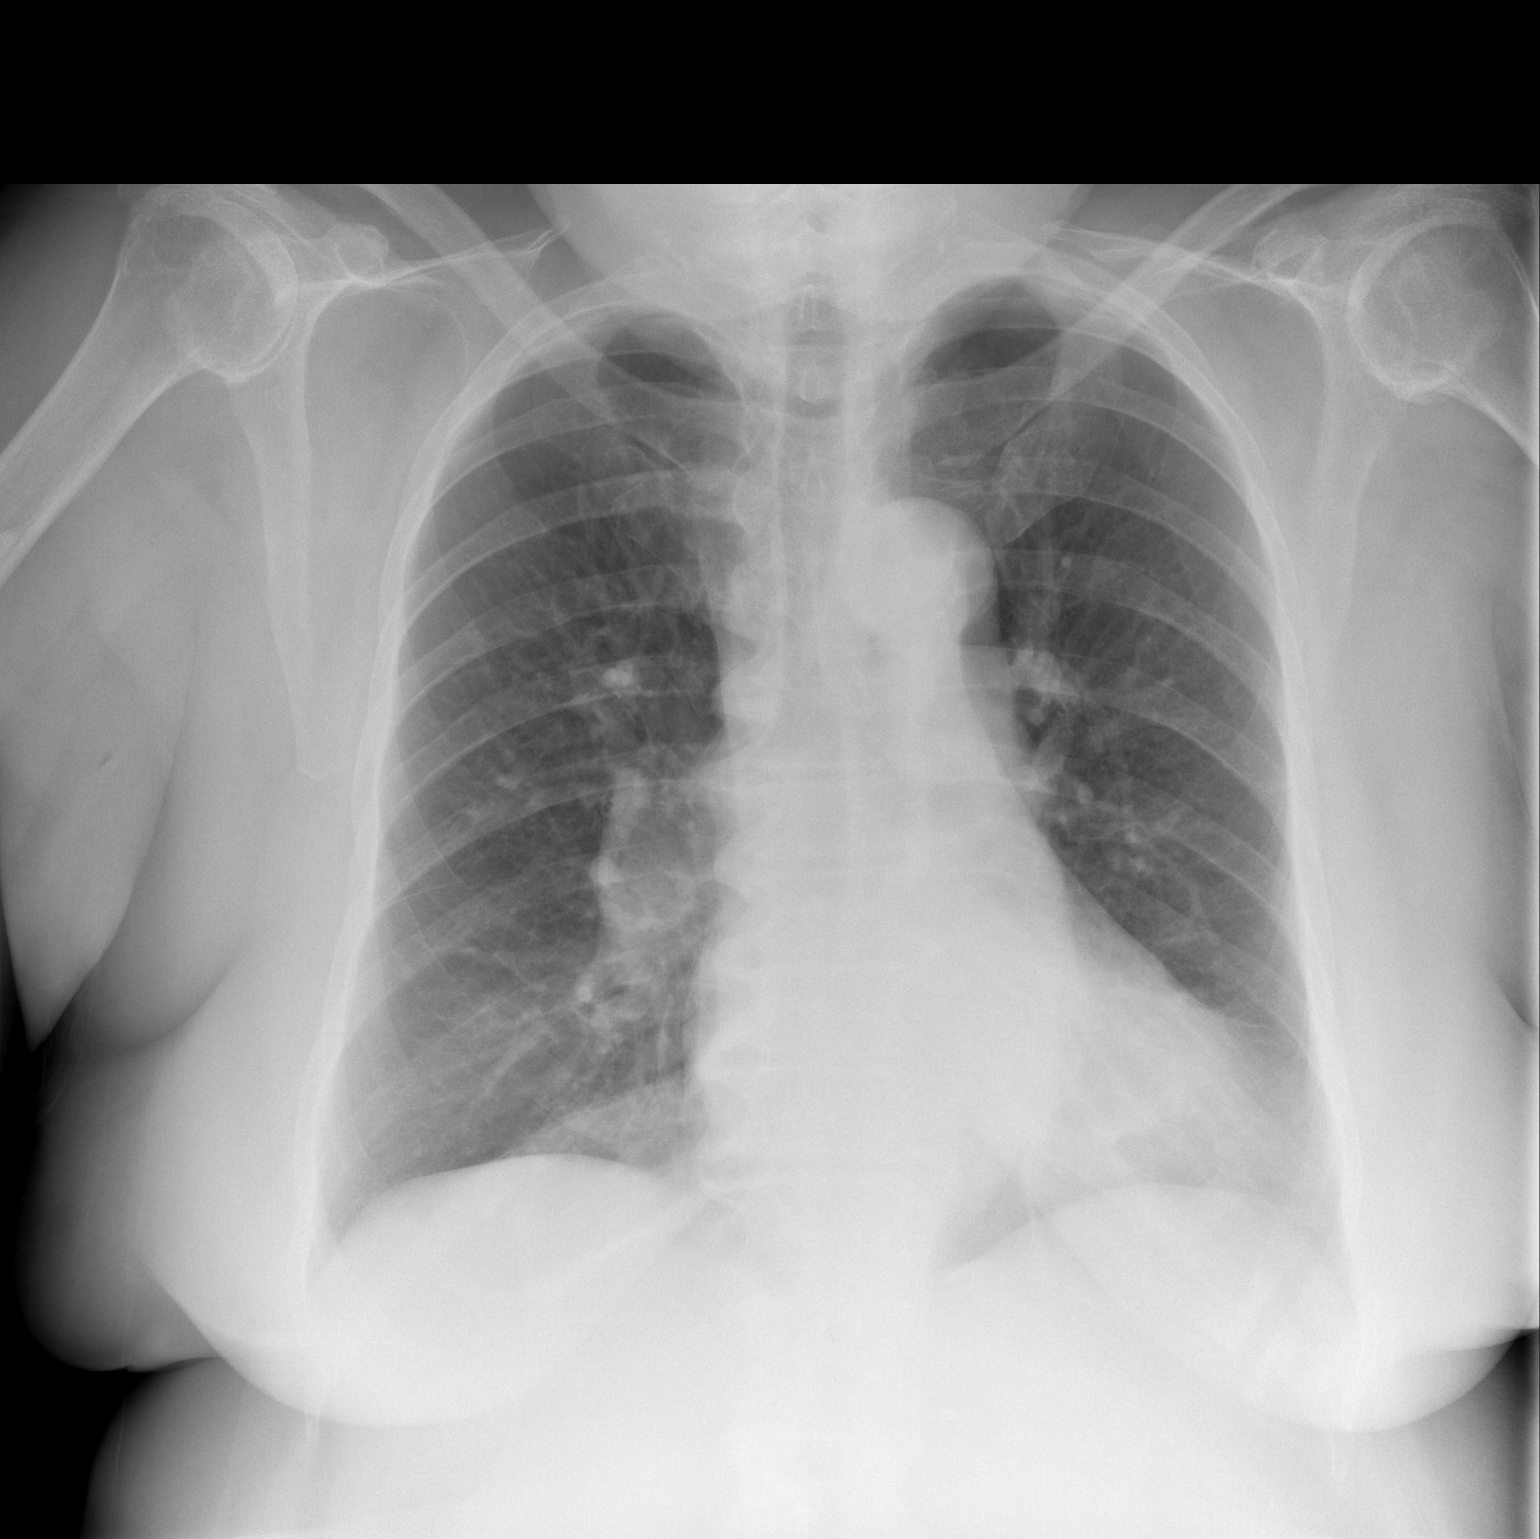

[w chest lat]
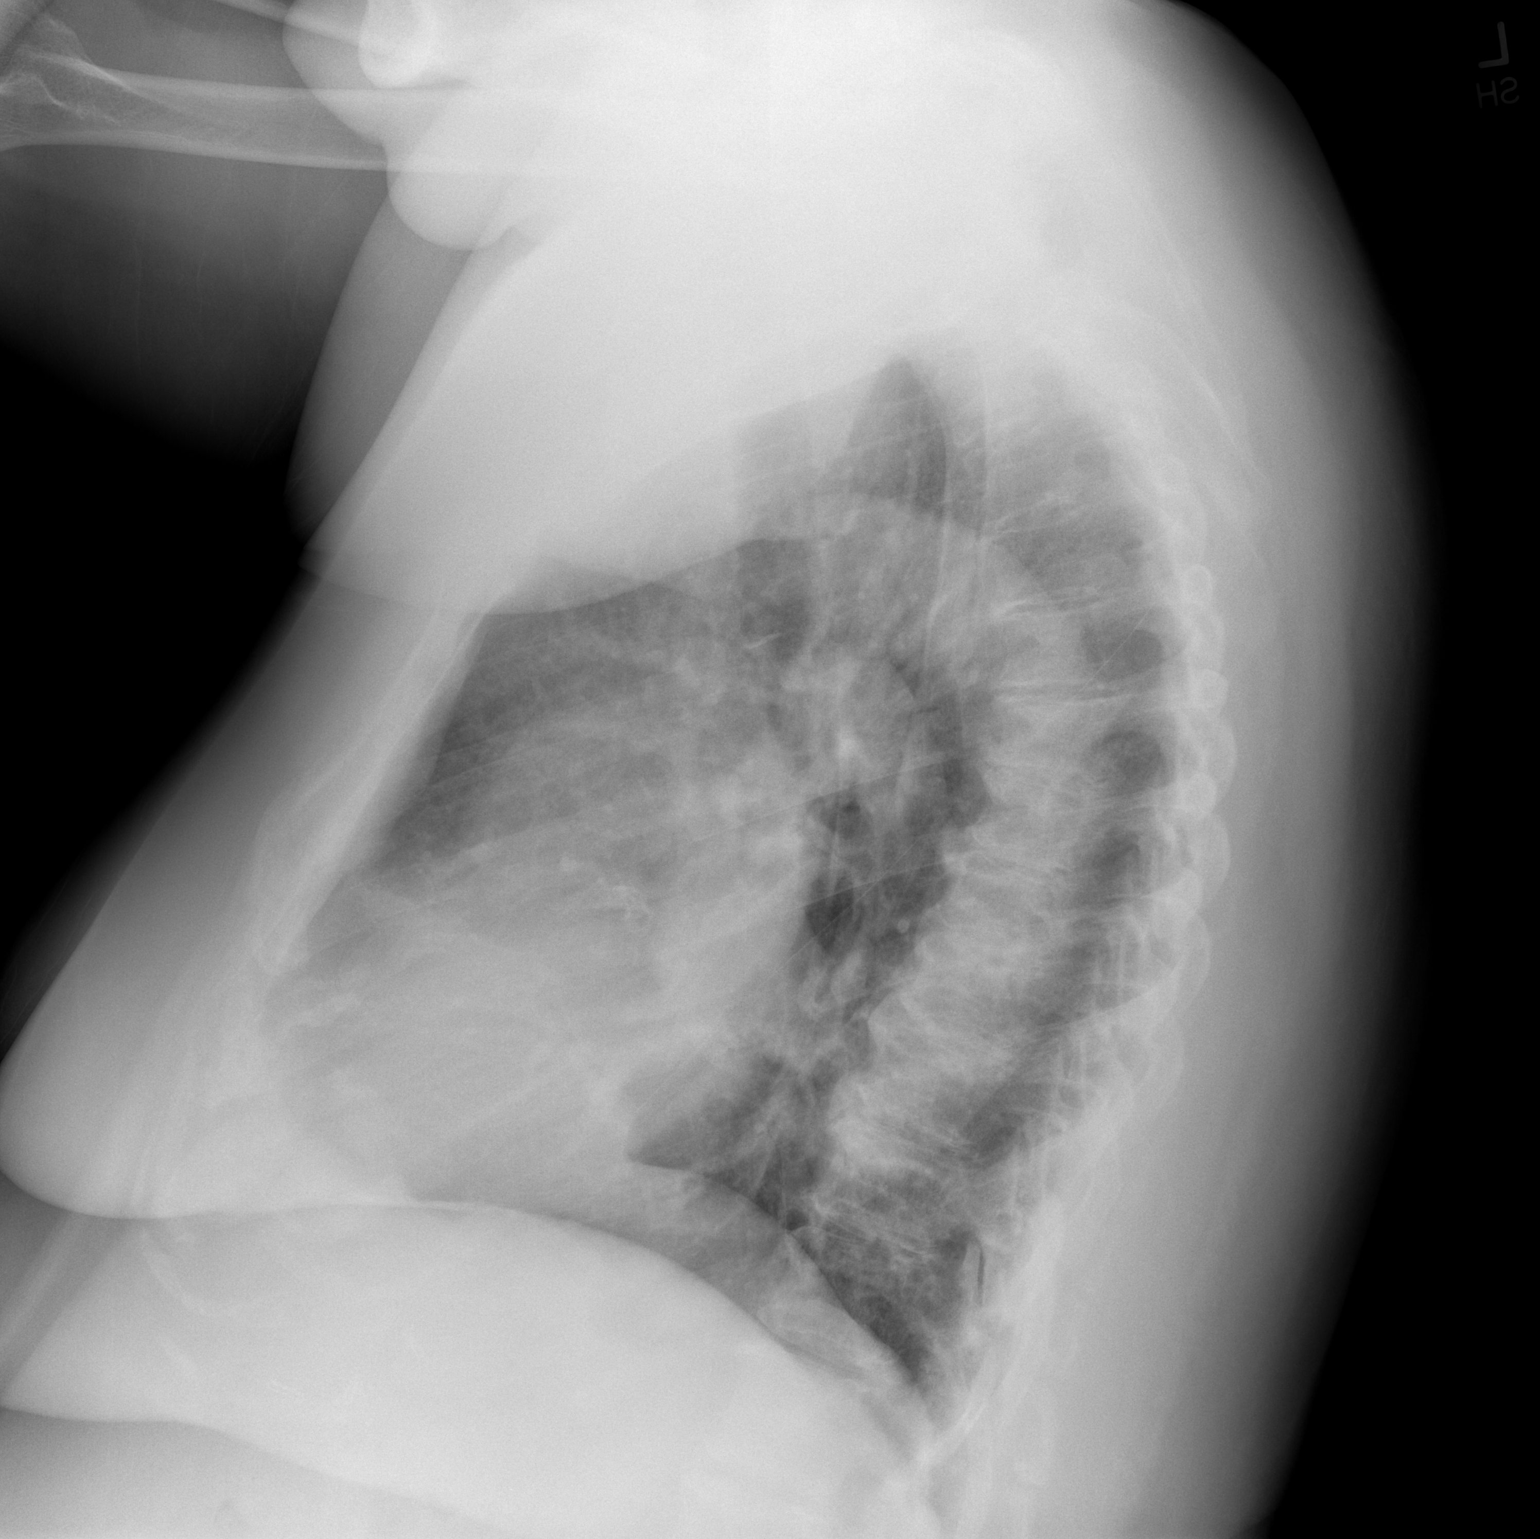

[2 of 2 positions shown; findings below may reference images not displayed]

FINDINGS: Lungs are clear.

Heart size normal. Prominent central pulmonary arteries. Coronary
calcifications.

No effusion.

Anterior vertebral endplate spurring at multiple levels in the mid
and lower thoracic spine. Bilateral shoulder DJD.
IMPRESSION: No acute cardiopulmonary disease.

## 2019-04-17 DIAGNOSIS — B351 Tinea unguium: Secondary | ICD-10-CM | POA: Diagnosis not present

## 2019-04-17 DIAGNOSIS — M79672 Pain in left foot: Secondary | ICD-10-CM | POA: Diagnosis not present

## 2019-04-17 DIAGNOSIS — E119 Type 2 diabetes mellitus without complications: Secondary | ICD-10-CM | POA: Diagnosis not present

## 2019-04-17 DIAGNOSIS — L84 Corns and callosities: Secondary | ICD-10-CM | POA: Diagnosis not present

## 2019-04-17 DIAGNOSIS — M79671 Pain in right foot: Secondary | ICD-10-CM | POA: Diagnosis not present

## 2019-04-18 DIAGNOSIS — J45909 Unspecified asthma, uncomplicated: Secondary | ICD-10-CM | POA: Diagnosis not present

## 2019-04-19 ENCOUNTER — Ambulatory Visit: Payer: Medicare Other | Admitting: Cardiology

## 2019-05-16 DIAGNOSIS — J45909 Unspecified asthma, uncomplicated: Secondary | ICD-10-CM | POA: Diagnosis not present

## 2019-05-31 DIAGNOSIS — J441 Chronic obstructive pulmonary disease with (acute) exacerbation: Secondary | ICD-10-CM | POA: Diagnosis not present

## 2019-05-31 DIAGNOSIS — R05 Cough: Secondary | ICD-10-CM | POA: Diagnosis not present

## 2019-05-31 DIAGNOSIS — R0981 Nasal congestion: Secondary | ICD-10-CM | POA: Diagnosis not present

## 2019-05-31 DIAGNOSIS — Z20822 Contact with and (suspected) exposure to covid-19: Secondary | ICD-10-CM | POA: Diagnosis not present

## 2019-06-16 DIAGNOSIS — J45909 Unspecified asthma, uncomplicated: Secondary | ICD-10-CM | POA: Diagnosis not present

## 2019-06-22 DIAGNOSIS — E785 Hyperlipidemia, unspecified: Secondary | ICD-10-CM | POA: Diagnosis not present

## 2019-06-22 DIAGNOSIS — I10 Essential (primary) hypertension: Secondary | ICD-10-CM | POA: Diagnosis not present

## 2019-06-22 DIAGNOSIS — J449 Chronic obstructive pulmonary disease, unspecified: Secondary | ICD-10-CM | POA: Diagnosis not present

## 2019-06-22 DIAGNOSIS — E1142 Type 2 diabetes mellitus with diabetic polyneuropathy: Secondary | ICD-10-CM | POA: Diagnosis not present

## 2019-06-29 ENCOUNTER — Other Ambulatory Visit: Payer: Self-pay | Admitting: Cardiology

## 2019-06-29 DIAGNOSIS — I10 Essential (primary) hypertension: Secondary | ICD-10-CM

## 2019-07-02 ENCOUNTER — Ambulatory Visit: Payer: Medicare Other | Admitting: Cardiology

## 2019-07-02 DIAGNOSIS — E119 Type 2 diabetes mellitus without complications: Secondary | ICD-10-CM | POA: Diagnosis not present

## 2019-07-02 NOTE — Progress Notes (Deleted)
Primary Physician:  Trey Sailors, PA   Patient ID: Angie Hill, female    DOB: 1940/09/14, 79 y.o.   MRN: [id number]  Subjective:    No chief complaint on file.   HPI: Angie Hill  is a 79 y.o. female  with hypertension, CAD s/p LAD PCI 2014, type 2 DM, OSA-not on CPAP, now [facility], chronic palpitations much improved on metoprolol, event monitoring Jan 2020, that revealed normal atrial fibrillation, low risk stress test also in March 2019, low normal LVEF by echocardiogram. Last seen 6 weeks ago, in which she was started on Isosorbide dinitrate and hydralazine and now presents for follow up.   She has not had any angina pectoris. Continues to have palpitations, but overall improved since being on Metoprolol. She is having difficulty with weight loss.  No dizziness or syncope. No other new symptoms.  Tolerating all medications well.  Past Medical History:  Diagnosis Date  . Anemia   . Angina pectoris ([place])   . Arthritis   . Asthma   . COPD (chronic obstructive pulmonary disease) ([place])   . Coronary artery disease   . Depression   . Diabetes mellitus without complication (HCC)    BORDERLINE  . GERD (gastroesophageal reflux disease)   . H/O hiatal hernia   . Hypertension   . Shortness of breath   . Sleep apnea     Past Surgical History:  Procedure Laterality Date  . ABDOMINAL HYSTERECTOMY     partial  . CORONARY ANGIOPLASTY WITH STENT PLACEMENT  07/04/2012   mid LAD  . LEFT AND RIGHT HEART CATHETERIZATION WITH CORONARY ANGIOGRAM N/A 07/04/2012   Procedure: LEFT AND RIGHT HEART CATHETERIZATION WITH CORONARY ANGIOGRAM;  Surgeon: Laverda Page, MD;  Location: [facility] CATH LAB;  Service: Cardiovascular;  Laterality: N/A;  . LEFT HEART CATHETERIZATION WITH CORONARY ANGIOGRAM N/A 10/17/2012   Procedure: LEFT HEART CATHETERIZATION WITH CORONARY ANGIOGRAM;  Surgeon: Laverda Page, MD;  Location: [facility] CATH LAB;  Service: Cardiovascular;   Laterality: N/A;  . PARTIAL COLECTOMY  1982  . PERCUTANEOUS CORONARY STENT INTERVENTION (PCI-S)  07/04/2012   Procedure: PERCUTANEOUS CORONARY STENT INTERVENTION (PCI-S);  Surgeon: Laverda Page, MD;  Location: [facility] CATH LAB;  Service: Cardiovascular;;  . TUBAL LIGATION      Social History   Socioeconomic History  . Marital status: Single    Spouse name: Not on file  . Number of children: 5  . Years of education: Not on file  . Highest education level: Not on file  Occupational History  . Not on file  Tobacco Use  . Smoking status: Never Smoker  . Smokeless tobacco: Never Used  Substance and Sexual Activity  . Alcohol use: No  . Drug use: No  . Sexual activity: Not on file  Other Topics Concern  . Not on file  Social History Narrative  . Not on file   Social Determinants of Health   Financial Resource Strain:   . Difficulty of Paying Living Expenses:   Food Insecurity:   . Worried About Charity fundraiser in the Last Year:   . Arboriculturist in the Last Year:   Transportation Needs:   . Film/video editor (Medical):   Marland Kitchen Lack of Transportation (Non-Medical):   Physical Activity:   . Days of Exercise per Week:   . Minutes of Exercise per Session:   Stress:   . Feeling of Stress :  Social Connections:   . Frequency of Communication with Friends and Family:   . Frequency of Social Gatherings with Friends and Family:   . Attends Religious Services:   . Active Member of Clubs or Organizations:   . Attends Banker Meetings:   Marland Kitchen Marital Status:   Intimate Partner Violence:   . Fear of Current or Ex-Partner:   . Emotionally Abused:   Marland Kitchen Physically Abused:   . Sexually Abused:    Review of Systems  Cardiovascular: Positive for dyspnea on exertion, leg swelling and palpitations. Negative for chest pain.  Musculoskeletal: Positive for back pain and joint pain.  Gastrointestinal: Negative for melena.   Objective:   Vitals with BMI 04/05/2019  04/02/2019 02/19/2019  Height 5\' 7"  5\' 7"  5\' 6"   Weight 268 lbs 266 lbs 3 oz 269 lbs  BMI 41.96 41.68 43.44  Systolic 146 144  Diastolic 84 81 91  Pulse 70 85 85    Physical Exam  Constitutional: She appears well-developed. No distress.  Morbidly obese  Neck:  Short neck and difficult to evaluate JVP  Cardiovascular: Normal rate, regular rhythm and normal heart sounds. Exam reveals no gallop.  No murmur heard. Pulses:      Carotid pulses are 2+ on the right side and 2+ on the left side.      Dorsalis pedis pulses are 2+ on the right side and 2+ on the left side.       Posterior tibial pulses are 2+ on the right side and 2+ on the left side.  Femoral and popliteal pulse difficult to feel due to patient's body habitus.  No edema. No JVD.  Pulmonary/Chest: Effort normal. She has wheezes (bilateral scattered expiratory wheezes).  Abdominal: Soft. Bowel sounds are normal.  Obese. Pannus present   Radiology: No results found.  Laboratory examination:   CMP Latest Ref Rng & Units 04/25/2018 04/18/2018 09/17/2017  Glucose 70 - 99 mg/dL 74 06/24/2018) 06/17/2018)  BUN 8 - 23 mg/dL 16 11 14   Creatinine 0.44 - 1.00 mg/dL 11/18/2017 660(Y 301(S  Sodium 135 - 145 mmol/L 141 147(H) 143  Potassium 3.5 - 5.1 mmol/L 3.4(L) 3.7 3.4(L)  Chloride 98 - 111 mmol/L 104 102 102  CO2 22 - 32 mmol/L 28 28 32  Calcium 8.9 - 10.3 mg/dL 9.5 9.1  Total Protein 6.5 - 8.1 g/dL 7.1 - -  Total Bilirubin 0.3 - 1.2 mg/dL 0.9 - -  Alkaline Phos 38 - 126 U/L 43 - -  AST 15 - 41 U/L 19 - -  ALT 0 - 44 U/L 23 - -   CBC Latest Ref Rng & Units 04/25/2018 09/17/2017 06/30/2016  WBC 4.0 - 10.5 K/uL 10.6(H) 10.5 7.9  Hemoglobin 12.0 - 15.0 g/dL 73.2 06/24/2018 11/18/2017  Hematocrit 36.0 - 46.0 % 39.4 38.4 39.1  Platelets 150 - 400 K/uL 345 277 321   Lipid Panel  No results found for: CHOL, TRIG, HDL, CHOLHDL, VLDL, LDLCALC, LDLDIRECT HEMOGLOBIN A1C No results found for: HGBA1C, MPG TSH No results for input(s): TSH in the last 8760  hours.  PRN Meds:. There are no discontinued medications. No outpatient medications have been marked as taking for the 07/02/19 encounter (Appointment) with 20.2, MD.    Cardiac Studies:   Coronary angiogram 2014: Patent pLAD stent (4.0X22 mm Integrity BMS) Mid LAD mod stenosis. FFR 0.86 (Nonsignificant) LCx: Ostial 20-30% stenosis. Ostial AV grove 30% stenosis RCA: Mid tandem 20-30% stenosis and 40-50% stenoses.  48 hour  holter monitor 04/07/2018:  Normal sinus rhythm. Min HR 60 and Max HR 103. Occasional PVC (4.3% burden) and rare PAC (<1% burden). No A fib or SVT noted. No heart block.  Echocardiogram 04/12/2018: Left ventricle cavity is normal in size. Moderate concentric hypertrophy of the left ventricle. Low normal global wall motion. Visual EF is 50-55%. Doppler evidence of grade I (impaired) diastolic dysfunction, normal LAP. Calculated EF 48%. Mild (Grade I) mitral regurgitation. Mild tricuspid regurgitation. Estimated pulmonary artery systolic pressure 29 mmHg. No significant change compared to previous study on 06/15/2017.  Lexiscan myoview stress test 06/06/2017:  1. Pharmacologic stress testing was performed with intravenous administration of .4 mg of Lexiscan over a 10-15 seconds infusion. Stress symptoms included chest tightness, dyspnea, dizziness, headache. Normal blood pressure. Exercise capacity not assessed. Stress EKG is non diagnostic for ischemia as it is a pharmacologic stress.  2. The overall quality of the study is fair. Review of the raw data in a rotational cine format reveals breast attenuation with imaging performed in sitting position. Attenuation is most pronounced on rest images. Left ventricular cavity is noted to be normal on the rest and stress studies. Gated SPECT images reveal normal myocardial thickening and wall motion. The left ventricular ejection fraction was calculated or visually estimated to be 65%. SPECT rest images demonstrate a  large area of mild intensity perfusion defect in basal to apical inferior, inferoseptal and apical inferolateral myocardial walls. Stress SPECT images demonstrate only a small area of mild intensity perfusion defect in apical inferior inferolateral myocardial walls. While perfusion defect on rest images likely represent breast attenuation, perfusion defect, perfusion defect on stress images could represent small area of mild ischemia.  3. Low risk study.   EKG:  EKG 02/19/2019: Sinus rhythm with first-degree block at rate of 86 bpm, normal axis.  No evidence of ischemia.  Voltage criteria for LVH.compared to 04/07/2018, frequent PACs not present.   Assessment:     ICD-10-CM   1. Essential hypertension  I10   2. Coronary artery disease of native artery of native heart with stable angina pectoris (HCC)  I25.118   3. Chronic diastolic heart failure (HCC)  C58.85      Recommendations:   Angie Hill  is a 79 y.o.  female  with hypertension, CAD s/p LAD PCI 2014, type 2 DM, OSA-not on CPAP, [facility], chronic palpitations much improved on metoprolol, event monitoring January 2020, that revealed normal atrial fibrillation, low risk stress test also in March 2019, low normal LVEF by echocardiogram. Last seen 6 weeks ago, in which she was started on Isosorbide dinitrate and hydralazine and now presents for follow up.   *** Symptoms of palpitations are still persisting but improved on beta blocker therapy.  No clinical evidence of congestive heart failure.  Patient was contemplating joining weight loss program research study on Weight loss at [facility] "[employer]" ([employer], [employer]UCATION AND STRENGTH TRAINING AND BONE HE[employer]) but did not join due to [place]. I have encouraged her to join this.   Encouraged her to join the same.  I have added isosorbide dinitrate 30 mg 3 times daily along with hydralazine 50 mg p.o. 3 times daily for hypertension  control, Will discontinue Imdur.  I would like to see her back in 3 months for follow-up of hypertension, Palpitations and angina.  Yates Decamp, MD, [facility] 07/02/2019, 6:04 AM [facility]. [facility]

## 2019-07-10 DIAGNOSIS — Z0001 Encounter for general adult medical examination with abnormal findings: Secondary | ICD-10-CM | POA: Diagnosis not present

## 2019-07-10 DIAGNOSIS — I110 Hypertensive heart disease with heart failure: Secondary | ICD-10-CM | POA: Diagnosis not present

## 2019-07-10 DIAGNOSIS — Z008 Encounter for other general examination: Secondary | ICD-10-CM | POA: Diagnosis not present

## 2019-07-10 DIAGNOSIS — Z79899 Other long term (current) drug therapy: Secondary | ICD-10-CM | POA: Diagnosis not present

## 2019-07-16 DIAGNOSIS — J45909 Unspecified asthma, uncomplicated: Secondary | ICD-10-CM | POA: Diagnosis not present

## 2019-07-30 DIAGNOSIS — E876 Hypokalemia: Secondary | ICD-10-CM | POA: Diagnosis not present

## 2019-07-30 DIAGNOSIS — G4733 Obstructive sleep apnea (adult) (pediatric): Secondary | ICD-10-CM | POA: Diagnosis not present

## 2019-10-03 ENCOUNTER — Other Ambulatory Visit: Payer: Self-pay | Admitting: Cardiology

## 2019-10-03 DIAGNOSIS — I10 Essential (primary) hypertension: Secondary | ICD-10-CM

## 2019-11-29 ENCOUNTER — Encounter: Payer: Self-pay | Admitting: Cardiology

## 2019-11-29 ENCOUNTER — Ambulatory Visit: Payer: Medicare Other | Admitting: Cardiology

## 2019-11-29 ENCOUNTER — Other Ambulatory Visit: Payer: Self-pay

## 2019-11-29 VITALS — BP 150/83 | HR 85 | Resp 17 | Ht 67.0 in | Wt 270.0 lb

## 2019-11-29 DIAGNOSIS — I25118 Atherosclerotic heart disease of native coronary artery with other forms of angina pectoris: Secondary | ICD-10-CM

## 2019-11-29 DIAGNOSIS — E66813 Obesity, class 3: Secondary | ICD-10-CM

## 2019-11-29 DIAGNOSIS — R002 Palpitations: Secondary | ICD-10-CM

## 2019-11-29 DIAGNOSIS — G4733 Obstructive sleep apnea (adult) (pediatric): Secondary | ICD-10-CM

## 2019-11-29 DIAGNOSIS — I10 Essential (primary) hypertension: Secondary | ICD-10-CM

## 2019-11-29 MED ORDER — SPIRONOLACTONE 50 MG PO TABS: 50.0000 mg | ORAL_TABLET | ORAL | 2 refills | Status: DC

## 2019-11-29 NOTE — Patient Instructions (Addendum)
"  [employer]" ([employer], EDUCATION AND STRENGTH TRAINING AND BONE HE[employer])   [phone], option 2  Please keep appointment with sleep doctor.  [place] [facility] with Dr. Frances Furbish  Blood work at [facility] in 10 days.

## 2019-11-29 NOTE — Progress Notes (Signed)
Primary Physician:  Verdell Face, DO   Patient ID: Angie Hill, female    DOB: Dec 26, 1940, 79 y.o.   MRN: [id number]  Subjective:    Chief Complaint  Patient presents with  . Follow-up    3 month  . Coronary Artery Disease  . Palpitations  . Hypertension    HPI: Angie Hill  is a 79 y.o. female  with hypertension, CAD s/p LAD PCI 2014, type 2 DM, OSA-not on CPAP, now has been set up for sleep study, chronic palpitations much improved on metoprolol, event monitoring Jan 2020, that revealed normal atrial fibrillation, low risk stress test also in March 2019, low normal LVEF by echocardiogram.   The patient presents today for 6 month follow up of hypertension, palpitations, and angina. She has overall been doing fairly well. She continues to have occasionally palpitations as well as chest pain. She states she walks daily around her apartment complex, but this does not trigger her palpitations or chest pain. Since her last visit she has not undergone the sleep study as planned.   Past Medical History:  Diagnosis Date  . Anemia   . Angina pectoris (HCC)   . Arthritis   . Asthma   . COPD (chronic obstructive pulmonary disease) (HCC)   . Coronary artery disease   . Depression   . Diabetes mellitus without complication (HCC)    BORDERLINE  . GERD (gastroesophageal reflux disease)   . H/O hiatal hernia   . Hypertension   . Shortness of breath   . Sleep apnea     Past Surgical History:  Procedure Laterality Date  . ABDOMINAL HYSTERECTOMY     partial  . CORONARY ANGIOPLASTY WITH STENT PLACEMENT  07/04/2012   mid LAD  . LEFT AND RIGHT HEART CATHETERIZATION WITH CORONARY ANGIOGRAM N/A 07/04/2012   Procedure: LEFT AND RIGHT HEART CATHETERIZATION WITH CORONARY ANGIOGRAM;  Surgeon: Pamella Pert, MD;  Location: [facility] CATH LAB;  Service: Cardiovascular;  Laterality: N/A;  . LEFT HEART CATHETERIZATION WITH CORONARY ANGIOGRAM N/A 10/17/2012   Procedure: LEFT HEART CATHETERIZATION WITH  CORONARY ANGIOGRAM;  Surgeon: Pamella Pert, MD;  Location: [facility] CATH LAB;  Service: Cardiovascular;  Laterality: N/A;  . PARTIAL COLECTOMY  1982  . PERCUTANEOUS CORONARY STENT INTERVENTION (PCI-S)  07/04/2012   Procedure: PERCUTANEOUS CORONARY STENT INTERVENTION (PCI-S);  Surgeon: Pamella Pert, MD;  Location: [facility] CATH LAB;  Service: Cardiovascular;;  . TUBAL LIGATION      Social History   Socioeconomic History  . Marital status: Single    Spouse name: Not on file  . Number of children: 5  . Years of education: Not on file  . Highest education level: Not on file  Occupational History  . Not on file  Tobacco Use  . Smoking status: Never Smoker  . Smokeless tobacco: Never Used  Vaping Use  . Vaping Use: Never used  Substance and Sexual Activity  . Alcohol use: No  . Drug use: No  . Sexual activity: Not on file  Other Topics Concern  . Not on file  Social History Narrative  . Not on file   Social Determinants of Health   Financial Resource Strain:   . Difficulty of Paying Living Expenses: Not on file  Food Insecurity:   . Worried About Programme researcher, broadcasting/film/video in the Last Year: Not on file  . Ran Out of Food in the Last Year: Not on file  Transportation Needs:   . Lack of Transportation (  Medical): Not on file  . Lack of Transportation (Non-Medical): Not on file  Physical Activity:   . Days of Exercise per Week: Not on file  . Minutes of Exercise per Session: Not on file  Stress:   . Feeling of Stress : Not on file  Social Connections:   . Frequency of Communication with Friends and Family: Not on file  . Frequency of Social Gatherings with Friends and Family: Not on file  . Attends Religious Services: Not on file  . Active Member of Clubs or Organizations: Not on file  . Attends Banker Meetings: Not on file  . Marital Status: Not on file  Intimate Partner Violence:   . Fear of Current or Ex-Partner: Not on file  . Emotionally Abused: Not on file  .  Physically Abused: Not on file  . Sexually Abused: Not on file   Review of Systems  Constitutional: Negative for malaise/fatigue, weight gain and weight loss.  Cardiovascular: Positive for chest pain, dyspnea on exertion, leg swelling (ankle) and palpitations. Negative for syncope.  Respiratory: Negative for shortness of breath.   Skin: Positive for rash (right ankle).  Musculoskeletal: Negative for myalgias.  Neurological: Negative for dizziness and weakness.  All other systems reviewed and are negative.  Objective:  Blood pressure (!) 150/83, pulse 85, resp. rate 17, height 5\' 7"  (1.702 m), weight 270 lb (122.5 kg), SpO2 97 %.  Vitals with BMI 11/29/2019 04/05/2019 04/02/2019  Height 5\' 7"  5\' 7"  5\' 7"   Weight 270 lbs 268 lbs 266 lbs 3 oz  BMI 42.28 41.96 41.68  Systolic 150 146 04/04/2019  Diastolic 83 84 81  Pulse 85 70 85    Physical Exam Constitutional:      General: She is not in acute distress.    Appearance: She is well-developed.     Comments: Morbidly obese  HENT:     Head: Atraumatic.  Eyes:     Conjunctiva/sclera: Conjunctivae normal.  Neck:     Thyroid: No thyromegaly.     Comments: Short neck and difficult to evaluate JVP Cardiovascular:     Rate and Rhythm: Normal rate and regular rhythm.     Pulses:          Carotid pulses are 2+ on the right side and 2+ on the left side.      Radial pulses are 2+ on the right side and 2+ on the left side.       Dorsalis pedis pulses are 2+ on the right side and 2+ on the left side.       Posterior tibial pulses are 2+ on the right side and 2+ on the left side.     Heart sounds: Normal heart sounds. No murmur heard.  No gallop.      Comments: 1-2+ edema in bilateral lower extremities Pulmonary:     Effort: Pulmonary effort is normal.     Breath sounds: Normal breath sounds.  Abdominal:     General: Bowel sounds are normal.     Palpations: Abdomen is soft.     Comments: Obese. Pannus present  Musculoskeletal:        General:  Normal range of motion.     Cervical back: Neck supple.  Skin:    General: Skin is warm and dry.  Neurological:     Mental Status: She is alert.     Laboratory examination:   CMP Latest Ref Rng & Units 04/25/2018 04/18/2018 09/17/2017  Glucose 70 - 99  mg/dL 74 329(J) 242(A)  BUN 8 - 23 mg/dL 16 11 14   Creatinine 0.44 - 1.00 mg/dL 8.34 1.96  Sodium 135 - 145 mmol/L 141 147(H) 143  Potassium 3.5 - 5.1 mmol/L 3.4(L) 3.7 3.4(L)  Chloride 98 - 111 mmol/L 104 102 102  CO2 22 - 32 mmol/L 28 28 32  Calcium 8.9 - 10.3 mg/dL 9.5 2.22 9.1  Total Protein 6.5 - 8.1 g/dL 7.1 - -  Total Bilirubin 0.3 - 1.2 mg/dL 0.9 - -  Alkaline Phos 38 - 126 U/L 43 - -  AST 15 - 41 U/L 19 - -  ALT 0 - 44 U/L 23 - -   CBC Latest Ref Rng & Units 04/25/2018 09/17/2017 06/30/2016  WBC 4.0 - 10.5 K/uL 10.6(H) 10.5 7.9  Hemoglobin 12.0 - 15.0 g/dL 07/02/2016 89.2 11.9  Hematocrit 36 - 46 % 39.4 38.4 39.1  Platelets 150 - 400 K/uL 345 277 321   Lipid Panel  No results found for: CHOL, TRIG, HDL, CHOLHDL, VLDL, LDLCALC, LDLDIRECT   HEMOGLOBIN A1C No results found for: HGBA1C, MPG TSH No results for input(s): TSH in the last 8760 hours.   Medications and allergies   Allergies  Allergen Reactions  . Ciprofloxacin Itching and Rash  . Erythromycin Itching and Rash   Outpatient Medications Prior to Visit  Medication Sig Dispense Refill  . allopurinol (ZYLOPRIM) 300 MG tablet Take 300 mg by mouth as needed.     41.7 amLODipine (NORVASC) 10 MG tablet Take 1 tablet (10 mg total) by mouth daily. (Patient taking differently: Take 10 mg by mouth daily at 12 noon. At noon) 180 tablet 3  . aspirin EC 81 MG tablet Take 81 mg by mouth daily.    Marland Kitchen atorvastatin (LIPITOR) 80 MG tablet Take 80 mg by mouth at bedtime.     Marland Kitchen b complex vitamins tablet Take 1 tablet by mouth daily.    . budesonide-formoterol (SYMBICORT) 160-4.5 MCG/ACT inhaler Inhale 2 puffs into the lungs daily.    . celecoxib (CELEBREX) 100 MG capsule Take 100 mg  by mouth as needed.     . cetirizine (ZYRTEC) 10 MG chewable tablet Chew 10 mg by mouth daily.    . clobetasol cream (TEMOVATE) 0.05 % Apply 1 application topically as needed. Apply to rash.    . Cyanocobalamin (VITAMIN B-12 PO) Take 1 tablet by mouth daily.     . fluticasone (FLONASE) 50 MCG/ACT nasal spray Place into the nose as needed.    . furosemide (LASIX) 20 MG tablet Take 20 mg by mouth daily as needed for edema. For leg swelling    . glipiZIDE (GLUCOTROL) 5 MG tablet Take 4 mg by mouth daily.     . hydrALAZINE (APRESOLINE) 50 MG tablet TAKE 1 TABLET BY MOUTH THREE TIMES A DAY 270 tablet 1  . isosorbide dinitrate (ISORDIL) 30 MG tablet Take 1 tablet (30 mg total) by mouth 3 (three) times daily. 90 tablet 2  . LINZESS 72 MCG capsule Take 72 mcg by mouth daily.    Marland Kitchen losartan (COZAAR) 100 MG tablet at bedtime.     . metoprolol succinate (TOPROL-XL) 100 MG 24 hr tablet Take by mouth daily.    . montelukast (SINGULAIR) 10 MG tablet Take 10 mg by mouth daily.    . nitroGLYCERIN (NITROSTAT) 0.4 MG SL tablet Place 1 tablet (0.4 mg total) under the tongue every 5 (five) minutes as needed for chest pain. 25 tablet 3  . pantoprazole (PROTONIX) 40  MG tablet Take 40 mg by mouth daily.     . potassium chloride (K-DUR) 10 MEQ tablet Take 10 mEq by mouth 2 (two) times daily.    . pregabalin (LYRICA) 100 MG capsule Take 100 mg by mouth 2 (two) times daily.    Marland Kitchen. PROAIR HFA 108 (90 Base) MCG/ACT inhaler as needed.    Marland Kitchen. albuterol (PROVENTIL HFA;VENTOLIN HFA) 108 (90 Base) MCG/ACT inhaler Inhale 2 puffs into the lungs 2 (two) times daily as needed for wheezing or shortness of breath. (Patient not taking: Reported on 11/29/2019) 1 Inhaler 0  . gabapentin (NEURONTIN) 600 MG tablet Take 600 mg by mouth at bedtime.  (Patient not taking: Reported on 11/29/2019)    . hydrochlorothiazide (HYDRODIURIL) 25 MG tablet Take 1 tablet (25 mg total) by mouth daily. (Patient not taking: Reported on 11/29/2019) 30 tablet 4  .  hydrOXYzine (VISTARIL) 50 MG capsule Take 50 mg by mouth 4 (four) times daily as needed for itching. (Patient not taking: Reported on 11/29/2019)     No facility-administered medications prior to visit.   Radiology:   No results found.   Cardiac Studies:   Coronary angiogram 2014: Patent pLAD stent (4.0X22 mm Integrity BMS) Mid LAD mod stenosis. FFR 0.86 (Nonsignificant) LCx: Ostial 20-30% stenosis. Ostial AV grove 30% stenosis RCA: Mid tandem 20-30% stenosis and 40-50% stenoses.  48 hour holter monitor 04/07/2018:  Normal sinus rhythm. Min HR 60 and Max HR 103. Occasional PVC (4.3% burden) and rare PAC (<1% burden). No A fib or SVT noted. No heart block.  Echocardiogram 04/12/2018: Left ventricle cavity is normal in size. Moderate concentric hypertrophy of the left ventricle. Low normal global wall motion. Visual EF is 50-55%. Doppler evidence of grade I (impaired) diastolic dysfunction, normal LAP. Calculated EF 48%. Mild (Grade I) mitral regurgitation. Mild tricuspid regurgitation. Estimated pulmonary artery systolic pressure 29 mmHg. No significant change compared to previous study on 06/15/2017.  Lexiscan myoview stress test 06/06/2017:  1. Pharmacologic stress testing was performed with intravenous administration of .4 mg of Lexiscan over a 10-15 seconds infusion. Stress symptoms included chest tightness, dyspnea, dizziness, headache. Normal blood pressure. Exercise capacity not assessed. Stress EKG is non diagnostic for ischemia as it is a pharmacologic stress.  2. The overall quality of the study is fair. Review of the raw data in a rotational cine format reveals breast attenuation with imaging performed in sitting position. Attenuation is most pronounced on rest images. Left ventricular cavity is noted to be normal on the rest and stress studies. Gated SPECT images reveal normal myocardial thickening and wall motion. The left ventricular ejection fraction was calculated or  visually estimated to be 65%. SPECT rest images demonstrate a large area of mild intensity perfusion defect in basal to apical inferior, inferoseptal and apical inferolateral myocardial walls. Stress SPECT images demonstrate only a small area of mild intensity perfusion defect in apical inferior inferolateral myocardial walls. While perfusion defect on rest images likely represent breast attenuation, perfusion defect, perfusion defect on stress images could represent small area of mild ischemia.  3. Low risk study.   EKG:  EKG 02/19/2019: Sinus rhythm with first-degree block at rate of 86 bpm, normal axis.  No evidence of ischemia.  Voltage criteria for LVH.compared to 04/07/2018, frequent PACs not present.   Assessment:     ICD-10-CM   1. Essential hypertension  I10 spironolactone (ALDACTONE) 50 MG tablet    Basic metabolic panel    TSH  2. Palpitations  R00.2 TSH  3. Coronary artery disease of native artery of native heart with stable angina pectoris (HCC)  I25.118 Lipid Panel With LDL/HDL Ratio    CBC  4. OSA (obstructive sleep apnea)  G47.33 Ambulatory referral to Sleep Studies  5. Class 3 severe obesity due to excess calories with serious comorbidity and body mass index (BMI) of 40.0 to 44.9 in adult [facility])  E66.01    Z68.41      Medications Discontinued During This Encounter  Medication Reason  . gabapentin (NEURONTIN) 600 MG tablet Side effect (s)  . hydrochlorothiazide (HYDRODIURIL) 25 MG tablet Ineffective  . hydrOXYzine (VISTARIL) 50 MG capsule Patient Preference    Meds ordered this encounter  Medications  . spironolactone (ALDACTONE) 50 MG tablet    Sig: Take 1 tablet (50 mg total) by mouth every morning.    Dispense:  30 tablet    Refill:  2    Recommendations:   TYNLEE BAYLE  is a 79 y.o.  female  with hypertension, CAD s/p LAD PCI 2014, type 2 DM, OSA-not on CPAP, chronic palpitations much improved on metoprolol, event monitoring January 2020, that revealed  normal atrial fibrillation, low risk stress test also in March 2019, low normal LVEF by echocardiogram.   Patient presents here for six-month office visit and follow-up of angina pectoris, palpitations and also difficult to control hypertension. She is still having intermittent palpitations but this remains improved on beta blocker therapy. She is on appropriate medical therapy for CAD.  Her blood pressure is elevated today. She was taking metoprolol, amlodipine, losartan, HCTZ, isordil, and hydralazine. I will discontinue HCTZ and add spironolactone instead. I will check BMP in 10 days. I suspect her persistent hypertension is related to untreated sleep apnea. She has been referred for a sleep study previously, but did not complete the testing. I have re-made the referral for this and encouraged. I discussed with the patient the importance of treating sleep apnea and the effects on blood pressure.   At last visit, the patient was contemplating joining weight loss program research study on Weight loss at [facility] "[employer]" (INCORPORATING NUTRITION, VESTS, EDUCATION AND STRENGTH TRAINING AND BONE HEALTH) but did not join at that time. I have encouraged her to join this and provided her with the contact information for the study.   We also discussed her concerns regarding COVID 19 vaccine and I encouraged her to schedule an appointment today to receive the vaccine. She states she will do this.   I will see her back in clinic in 6 weeks.  Patient seen and examined in conjunction with Aris Lot, PA second year student at [employer].  Time spent is in direct patient face to face encounter not including the teaching and training involved.    Yates Decamp, MD, [facility] 11/30/2019, 3:19 PM Office: [phone]

## 2020-01-08 ENCOUNTER — Encounter: Payer: Self-pay | Admitting: Neurology

## 2020-01-08 ENCOUNTER — Institutional Professional Consult (permissible substitution): Payer: Medicare Other | Admitting: Neurology

## 2020-01-08 ENCOUNTER — Telehealth: Payer: Self-pay

## 2020-01-08 NOTE — Telephone Encounter (Signed)
Pt did not show for their appt with Dr. Athar today.  

## 2020-01-10 ENCOUNTER — Ambulatory Visit: Payer: Medicare Other | Admitting: Cardiology

## 2020-04-21 ENCOUNTER — Ambulatory Visit: Payer: Medicare Other | Admitting: Cardiology

## 2020-04-21 ENCOUNTER — Other Ambulatory Visit: Payer: Self-pay

## 2020-04-21 ENCOUNTER — Encounter: Payer: Self-pay | Admitting: Cardiology

## 2020-04-21 VITALS — BP 159/89 | HR 89 | Temp 97.2°F | Resp 16 | Ht 67.0 in | Wt 271.6 lb

## 2020-04-21 DIAGNOSIS — I739 Peripheral vascular disease, unspecified: Secondary | ICD-10-CM

## 2020-04-21 DIAGNOSIS — E119 Type 2 diabetes mellitus without complications: Secondary | ICD-10-CM

## 2020-04-21 DIAGNOSIS — I25118 Atherosclerotic heart disease of native coronary artery with other forms of angina pectoris: Secondary | ICD-10-CM

## 2020-04-21 DIAGNOSIS — R002 Palpitations: Secondary | ICD-10-CM

## 2020-04-21 DIAGNOSIS — I10 Essential (primary) hypertension: Secondary | ICD-10-CM

## 2020-04-21 MED ORDER — METOPROLOL SUCCINATE ER 200 MG PO TB24: 200.0000 mg | ORAL_TABLET | Freq: Every day | ORAL | 1 refills | Status: DC

## 2020-04-21 MED ORDER — AMLODIPINE BESYLATE 10 MG PO TABS: 10.0000 mg | ORAL_TABLET | Freq: Every day | ORAL | 3 refills | Status: DC

## 2020-04-21 NOTE — Progress Notes (Signed)
Primary Physician:  Wynelle Fanny, DO   Patient ID: Angie Hill, female    DOB: [birth year], 80 y.o.   MRN: [id number]  Subjective:    Chief Complaint  Patient presents with  . Hypertension  . Hyperlipidemia  . Follow-up    6 week    HPI: Angie Hill  is a 80 y.o. female  with hypertension, CAD s/p LAD PCI 2014, type 2 DM, OSA-not on CPAP, now has been set up for sleep study, chronic palpitations much improved on metoprolol, event monitoring Jan 2020, that revealed no atrial fibrillation, low risk stress test also in March 2019, low normal LVEF by echocardiogram.   The patient presents today for 6 month follow up of hypertension, palpitations, and angina.  She has multiple complaints today, states that she has been having sharp chest pains that last a few seconds.  She also states that she has rapid palpitations lasting few seconds sometimes for a few minutes and also flip-flopping in her heart.  History is hard to obtain as she does not endorse significant physical activity although she does walks daily around her apartment complex, but this does not trigger her palpitations or chest pain.  She also complains of severe pain in her calves left worse than the right, states that it is making it difficult to walk.  Past Medical History:  Diagnosis Date  . Anemia   . Angina pectoris ([place])   . Arthritis   . Asthma   . COPD (chronic obstructive pulmonary disease) ([place])   . Coronary artery disease   . Depression   . Diabetes mellitus without complication (HCC)    BORDERLINE  . GERD (gastroesophageal reflux disease)   . H/O hiatal hernia   . Hypertension   . Shortness of breath   . Sleep apnea     Past Surgical History:  Procedure Laterality Date  . ABDOMINAL HYSTERECTOMY     partial  . CORONARY ANGIOPLASTY WITH STENT PLACEMENT  07/04/2012   mid LAD  . LEFT AND RIGHT HEART CATHETERIZATION WITH CORONARY ANGIOGRAM N/A 07/04/2012   Procedure: LEFT AND RIGHT HEART CATHETERIZATION  WITH CORONARY ANGIOGRAM;  Surgeon: Laverda Page, MD;  Location: [facility] CATH LAB;  Service: Cardiovascular;  Laterality: N/A;  . LEFT HEART CATHETERIZATION WITH CORONARY ANGIOGRAM N/A 10/17/2012   Procedure: LEFT HEART CATHETERIZATION WITH CORONARY ANGIOGRAM;  Surgeon: Laverda Page, MD;  Location: [facility] CATH LAB;  Service: Cardiovascular;  Laterality: N/A;  . PARTIAL COLECTOMY  1982  . PERCUTANEOUS CORONARY STENT INTERVENTION (PCI-S)  07/04/2012   Procedure: PERCUTANEOUS CORONARY STENT INTERVENTION (PCI-S);  Surgeon: Laverda Page, MD;  Location: [facility] CATH LAB;  Service: Cardiovascular;;  . TUBAL LIGATION      Social History   Socioeconomic History  . Marital status: Single    Spouse name: Not on file  . Number of children: 5  . Years of education: Not on file  . Highest education level: Not on file  Occupational History  . Not on file  Tobacco Use  . Smoking status: Never Smoker  . Smokeless tobacco: Never Used  Vaping Use  . Vaping Use: Never used  Substance and Sexual Activity  . Alcohol use: No  . Drug use: No  . Sexual activity: Not on file  Other Topics Concern  . Not on file  Social History Narrative  . Not on file   Social Determinants of Health   Financial Resource Strain: Not on file  Food Insecurity: Not on  file  Transportation Needs: Not on file  Physical Activity: Not on file  Stress: Not on file  Social Connections: Not on file  Intimate Partner Violence: Not on file   Review of Systems  Cardiovascular: Positive for chest pain, claudication, dyspnea on exertion, leg swelling (ankle) and palpitations. Negative for syncope.  Respiratory: Positive for snoring. Negative for shortness of breath.   Skin: Positive for rash (right ankle).  Musculoskeletal: Positive for arthritis and joint pain.  Neurological: Negative for dizziness.  All other systems reviewed and are negative.  Objective:  Blood pressure (!) 159/89, pulse 89, temperature (!) 97.2 F (36.2  C), temperature source Temporal, resp. rate 16, height 5' 7"  (1.702 m), weight 271 lb 9.6 oz (123.2 kg), SpO2 98 %.  Vitals with BMI 04/21/2020 11/29/2019 04/05/2019  Height 5' 7"  5' 7"  5' 7"   Weight 271 lbs 10 oz 270 lbs 268 lbs  BMI 42.53 59.74 16.38  Systolic 453 646 803  Diastolic 89 83 84  Pulse 89 85 70    Physical Exam Constitutional:      General: She is not in acute distress.    Appearance: She is well-developed.     Comments: Morbidly obese  HENT:     Head: Atraumatic.  Eyes:     Conjunctiva/sclera: Conjunctivae normal.  Neck:     Thyroid: No thyromegaly.     Comments: Short neck and difficult to evaluate JVP Cardiovascular:     Rate and Rhythm: Normal rate and regular rhythm.     Pulses:          Carotid pulses are 2+ on the right side and 2+ on the left side.      Radial pulses are 2+ on the right side and 2+ on the left side.       Dorsalis pedis pulses are 1+ on the right side and 1+ on the left side.       Posterior tibial pulses are 1+ on the right side and 1+ on the left side.     Heart sounds: Normal heart sounds. No murmur heard. No gallop.      Comments: 2+ edema in bilateral lower extremities Pulmonary:     Effort: Pulmonary effort is normal.     Breath sounds: Normal breath sounds.  Abdominal:     General: Bowel sounds are normal.     Palpations: Abdomen is soft.     Comments: Obese. Pannus present  Musculoskeletal:        General: Normal range of motion.     Cervical back: Neck supple.  Skin:    General: Skin is warm and dry.     Capillary Refill: Capillary refill takes less than 2 seconds.  Neurological:     Mental Status: She is alert.    Laboratory examination:   CMP Latest Ref Rng & Units 04/25/2018 04/18/2018 09/17/2017  Glucose 70 - 99 mg/dL 74 106(H) 119(H)  BUN 8 - 23 mg/dL 16 11 14   Creatinine 0.44 - 1.00 mg/dL 0.58 0.83 0.81  Sodium 135 - 145 mmol/L 141 147(H) 143  Potassium 3.5 - 5.1 mmol/L 3.4(L) 3.7 3.4(L)  Chloride 98 - 111 mmol/L  104 102 102  CO2 22 - 32 mmol/L 28 28 32  Calcium 8.9 - 10.3 mg/dL 9.5 10.1 9.1  Total Protein 6.5 - 8.1 g/dL 7.1 - -  Total Bilirubin 0.3 - 1.2 mg/dL 0.9 - -  Alkaline Phos 38 - 126 U/L 43 - -  AST 15 - 41  U/L 19 - -  ALT 0 - 44 U/L 23 - -   CBC Latest Ref Rng & Units 04/25/2018 09/17/2017 06/30/2016  WBC 4.0 - 10.5 K/uL 10.6(H) 10.5 7.9  Hemoglobin 12.0 - 15.0 g/dL 12.1 12.5 12.5  Hematocrit 36.0 - 46.0 % 39.4 38.4 39.1  Platelets 150 - 400 K/uL 345 277 321   Lipid Panel  No results found for: CHOL, TRIG, HDL, CHOLHDL, VLDL, LDLCALC, LDLDIRECT   HEMOGLOBIN A1C No results found for: HGBA1C, MPG TSH No results for input(s): TSH in the last 8760 hours.   Medications and allergies   Allergies  Allergen Reactions  . Ciprofloxacin Itching and Rash  . Erythromycin Itching and Rash   Outpatient Medications Prior to Visit  Medication Sig Dispense Refill  . albuterol (PROVENTIL HFA;VENTOLIN HFA) 108 (90 Base) MCG/ACT inhaler Inhale 2 puffs into the lungs 2 (two) times daily as needed for wheezing or shortness of breath. 1 Inhaler 0  . allopurinol (ZYLOPRIM) 300 MG tablet Take 300 mg by mouth as needed.     Marland Kitchen aspirin EC 81 MG tablet Take 81 mg by mouth daily.    Marland Kitchen atorvastatin (LIPITOR) 80 MG tablet Take 80 mg by mouth at bedtime.     Marland Kitchen b complex vitamins tablet Take 1 tablet by mouth daily.    . budesonide-formoterol (SYMBICORT) 160-4.5 MCG/ACT inhaler Inhale 2 puffs into the lungs daily.    . celecoxib (CELEBREX) 100 MG capsule Take 100 mg by mouth as needed.     . cetirizine (ZYRTEC) 10 MG chewable tablet Chew 10 mg by mouth daily.    . clobetasol cream (TEMOVATE) 7.82 % Apply 1 application topically as needed. Apply to rash.    . Cyanocobalamin (VITAMIN B-12 PO) Take 1 tablet by mouth daily.     . fluticasone (FLONASE) 50 MCG/ACT nasal spray Place into the nose as needed.    . furosemide (LASIX) 20 MG tablet Take 20 mg by mouth daily as needed for edema. For leg swelling    .  glipiZIDE (GLUCOTROL) 5 MG tablet Take 4 mg by mouth daily.     . hydrALAZINE (APRESOLINE) 50 MG tablet TAKE 1 TABLET BY MOUTH THREE TIMES A DAY 270 tablet 1  . isosorbide dinitrate (ISORDIL) 30 MG tablet Take 1 tablet (30 mg total) by mouth 3 (three) times daily. 90 tablet 2  . LINZESS 72 MCG capsule Take 72 mcg by mouth daily.    Marland Kitchen losartan (COZAAR) 100 MG tablet at bedtime.     . montelukast (SINGULAIR) 10 MG tablet Take 10 mg by mouth daily.    . nitroGLYCERIN (NITROSTAT) 0.4 MG SL tablet Place 1 tablet (0.4 mg total) under the tongue every 5 (five) minutes as needed for chest pain. 25 tablet 3  . pantoprazole (PROTONIX) 40 MG tablet Take 40 mg by mouth daily.     . potassium chloride (K-DUR) 10 MEQ tablet Take 10 mEq by mouth 2 (two) times daily.    . pregabalin (LYRICA) 100 MG capsule Take 100 mg by mouth 2 (two) times daily.    Marland Kitchen PROAIR HFA 108 (90 Base) MCG/ACT inhaler as needed.    Marland Kitchen spironolactone (ALDACTONE) 50 MG tablet Take 1 tablet (50 mg total) by mouth every morning. 30 tablet 2  . amLODipine (NORVASC) 10 MG tablet Take 1 tablet (10 mg total) by mouth daily. (Patient taking differently: Take 10 mg by mouth daily at 12 noon. At noon) 180 tablet 3  . metoprolol succinate (TOPROL-XL)  100 MG 24 hr tablet Take by mouth daily.     No facility-administered medications prior to visit.   Radiology:   No results found.   Cardiac Studies:   Coronary angiogram 2014: Patent pLAD stent (4.0X22 mm Integrity BMS) Mid LAD mod stenosis. FFR 0.86 (Nonsignificant) LCx: Ostial 20-30% stenosis. Ostial AV grove 30% stenosis RCA: Mid tandem 20-30% stenosis and 40-50% stenoses.  48 hour holter monitor 04/07/2018:  Normal sinus rhythm. Min HR 60 and Max HR 103. Occasional PVC (4.3% burden) and rare PAC (<1% burden). No A fib or SVT noted. No heart block.  Echocardiogram 04/12/2018: Left ventricle cavity is normal in size. Moderate concentric hypertrophy of the left ventricle. Low normal  global wall motion. Visual EF is 50-55%. Doppler evidence of grade I (impaired) diastolic dysfunction, normal LAP. Calculated EF 48%. Mild (Grade I) mitral regurgitation. Mild tricuspid regurgitation. Estimated pulmonary artery systolic pressure 29 mmHg. No significant change compared to previous study on 06/15/2017.  Lexiscan myoview stress test 06/06/2017:  1. Pharmacologic stress testing was performed with intravenous administration of .4 mg of Lexiscan over a 10-15 seconds infusion. Stress symptoms included chest tightness, dyspnea, dizziness, headache. Normal blood pressure. Exercise capacity not assessed. Stress EKG is non diagnostic for ischemia as it is a pharmacologic stress.  2. The overall quality of the study is fair. Review of the raw data in a rotational cine format reveals breast attenuation with imaging performed in sitting position. Attenuation is most pronounced on rest images. Left ventricular cavity is noted to be normal on the rest and stress studies. Gated SPECT images reveal normal myocardial thickening and wall motion. The left ventricular ejection fraction was calculated or visually estimated to be 65%. SPECT rest images demonstrate a large area of mild intensity perfusion defect in basal to apical inferior, inferoseptal and apical inferolateral myocardial walls. Stress SPECT images demonstrate only a small area of mild intensity perfusion defect in apical inferior inferolateral myocardial walls. While perfusion defect on rest images likely represent breast attenuation, perfusion defect, perfusion defect on stress images could represent small area of mild ischemia.  3. Low risk study.   EKG:  EKG 02/19/2019: Sinus rhythm with first-degree block at rate of 86 bpm, normal axis.  No evidence of ischemia.  Voltage criteria for LVH.compared to 04/07/2018, frequent PACs not present.   Assessment:     ICD-10-CM   1. Coronary artery disease of native artery of native heart with  stable angina pectoris (HCC)  I25.118 CBC    Lipid Panel With LDL/HDL Ratio    CMP14+EGFR  2. Essential hypertension  I10 metoprolol succinate (TOPROL-XL) 200 MG 24 hr tablet    TSH    amLODipine (NORVASC) 10 MG tablet  3. Palpitations  R00.2 metoprolol succinate (TOPROL-XL) 200 MG 24 hr tablet  4. Type 2 diabetes mellitus without complication, without long-term current use of insulin (HCC)  E11.9 Hgb A1c w/o eAG  5. Claudication in peripheral vascular disease (HCC)  I73.9 PCV LOWER ARTERIAL (BILATERAL)     Medications Discontinued During This Encounter  Medication Reason  . metoprolol succinate (TOPROL-XL) 100 MG 24 hr tablet Reorder  . amLODipine (NORVASC) 10 MG tablet Reorder    Meds ordered this encounter  Medications  . metoprolol succinate (TOPROL-XL) 200 MG 24 hr tablet    Sig: Take 1 tablet (200 mg total) by mouth daily.    Dispense:  90 tablet    Refill:  1  . amLODipine (NORVASC) 10 MG tablet    Sig:  Take 1 tablet (10 mg total) by mouth daily at 12 noon. At noon    Dispense:  90 tablet    Refill:  3    Recommendations:   BASSY FETTERLY  is a 80 y.o.  female  with hypertension, CAD s/p LAD PCI 2014, type 2 DM, OSA-not on CPAP, chronic palpitations much improved on metoprolol, event monitoring January 2020, that revealed no atrial fibrillation, low risk stress test also in March 2019, low normal LVEF by echocardiogram.   Patient presents here for six-month office visit and follow-up of angina pectoris, palpitations and also difficult to control hypertension. She is still having intermittent palpitations but this remains improved on beta blocker therapy. She is on appropriate medical therapy for CAD.  Continues to have sharp chest pains that does not appear to be angina.  Blood pressure is still elevated today, both for palpitations and elevated blood pressure, I will increase metoprolol succinate from 100 mg to 20 mg daily.  She continues to complain of cramping in her legs  and weakness in her leg left leg worse than the right in the calves, peripheral pulse mildly reduced, physical exam limited due to morbid obesity, I will obtain lower extremity arterial duplex.  We will obtain labs including lipid profile testing and TSH.  I would like to see her back in 6 weeks.  Previously I referred her for sleep study, she did not keep up the appointment with Dr. Rexene Alberts.  She now is willing to try this again but I have given her the contact number and advised her that she needs to make the initial contact as she has missed > 1 appointment.  I will check orthostatics on her next office visit.  This was a 40-minute office visit encounter with multiple somatic complaints and multiple organ system involvement and discussion.  I have given her information regarding Covid vaccination again and encouraged her to get vaccinated.    Adrian Prows, MD, [facility] 04/21/2020, 11:02 AM Office: [phone]

## 2020-05-06 ENCOUNTER — Other Ambulatory Visit: Payer: Medicare Other

## 2020-05-22 ENCOUNTER — Ambulatory Visit: Payer: Medicare Other

## 2020-05-22 ENCOUNTER — Other Ambulatory Visit: Payer: Self-pay

## 2020-05-22 DIAGNOSIS — I739 Peripheral vascular disease, unspecified: Secondary | ICD-10-CM

## 2020-05-27 ENCOUNTER — Institutional Professional Consult (permissible substitution): Payer: Medicare Other | Admitting: Neurology

## 2020-05-27 NOTE — Progress Notes (Signed)
Called patient, NA, voice prompt says "call did not go through"

## 2020-05-28 NOTE — Progress Notes (Signed)
Called and spoke with pt regarding lower arterial duplex. Pt voiced understanding.

## 2020-07-22 ENCOUNTER — Other Ambulatory Visit: Payer: Self-pay | Admitting: Cardiology

## 2020-07-22 DIAGNOSIS — R002 Palpitations: Secondary | ICD-10-CM

## 2020-07-22 DIAGNOSIS — I10 Essential (primary) hypertension: Secondary | ICD-10-CM

## 2020-10-23 ENCOUNTER — Encounter: Payer: Self-pay | Admitting: Cardiology

## 2020-10-23 ENCOUNTER — Ambulatory Visit (INDEPENDENT_AMBULATORY_CARE_PROVIDER_SITE_OTHER): Payer: Medicare Other | Admitting: Cardiology

## 2020-10-23 ENCOUNTER — Other Ambulatory Visit: Payer: Self-pay

## 2020-10-23 VITALS — BP 162/90 | HR 80 | Temp 98.0°F | Resp 16 | Ht 67.0 in | Wt 280.0 lb

## 2020-10-23 DIAGNOSIS — I25118 Atherosclerotic heart disease of native coronary artery with other forms of angina pectoris: Secondary | ICD-10-CM

## 2020-10-23 DIAGNOSIS — I10 Essential (primary) hypertension: Secondary | ICD-10-CM

## 2020-10-23 DIAGNOSIS — I872 Venous insufficiency (chronic) (peripheral): Secondary | ICD-10-CM

## 2020-10-23 DIAGNOSIS — R600 Localized edema: Secondary | ICD-10-CM

## 2020-10-23 DIAGNOSIS — R002 Palpitations: Secondary | ICD-10-CM

## 2020-10-23 MED ORDER — HYDRALAZINE HCL 50 MG PO TABS: 50.0000 mg | ORAL_TABLET | Freq: Three times a day (TID) | ORAL | 3 refills | Status: DC

## 2020-10-23 MED ORDER — METOPROLOL SUCCINATE ER 200 MG PO TB24: 200.0000 mg | ORAL_TABLET | Freq: Every day | ORAL | 3 refills | Status: AC

## 2020-10-23 MED ORDER — ISOSORBIDE DINITRATE 30 MG PO TABS: 30.0000 mg | ORAL_TABLET | Freq: Three times a day (TID) | ORAL | 3 refills | Status: DC

## 2020-10-23 NOTE — Progress Notes (Signed)
Primary Physician:  Verdell FaceLim, Sheri, DO   Patient ID: Angie Hill, female    DOB: 10-May-1940, 80 y.o.   MRN: [id number]  Subjective:    Chief Complaint  Patient presents with   Coronary Artery Disease   Hypertension   Dizziness   Follow-up     HPI: Angie Hill  is a 80 y.o. female  with hypertension, CAD s/p LAD PCI 2014, type 2 DM, OSA-not on CPAP, now has been set up for sleep study, chronic palpitations much improved on metoprolol, event monitoring Jan 2020, that revealed no atrial fibrillation, low risk stress test also in March 2019, low normal LVEF by echocardiogram.   Patient presents for 612-month follow-up of hypertension and orthostatic hypotension.  At last visit increase metoprolol succinate from 100 mg 200 mg daily given uncontrolled hypertension and palpitations.  Last office visit several lab tests were ordered and patient was recommended to follow-up for sleep study, however she has not done labs or followed up regarding evaluation for sleep apnea.  Also review of medications today reveals patient is inadvertently stopped taking losartan, spironolactone, hydralazine, and isosorbide dinitrate.   Over the last 3 to 4 months patient has noticed increased palpitations as well as increased bilateral lower leg edema.  She states numbness and pain in her feet have been worsening bilaterally and she has been feeling "off balance when walking".  Patient does not monitor her blood pressure on a regular basis at home.  She had recently been evaluated by urgent care with negative DVT study of the left lower extremity.   Notably patient also has multiple musculoskeletal complaints including knee pain, finger pain, and arm pain management and evaluation of which I will defer to PCP.  Past Medical History:  Diagnosis Date   Anemia    Angina pectoris (HCC)    Arthritis    Asthma    COPD (chronic obstructive pulmonary disease) (HCC)    Coronary artery disease    Depression     Diabetes mellitus without complication (HCC)    BORDERLINE   GERD (gastroesophageal reflux disease)    H/O hiatal hernia    Hypertension    Shortness of breath    Sleep apnea    Past Surgical History:  Procedure Laterality Date   ABDOMINAL HYSTERECTOMY     partial   CORONARY ANGIOPLASTY WITH STENT PLACEMENT  07/04/2012   mid LAD   LEFT AND RIGHT HEART CATHETERIZATION WITH CORONARY ANGIOGRAM N/A 07/04/2012   Procedure: LEFT AND RIGHT HEART CATHETERIZATION WITH CORONARY ANGIOGRAM;  Surgeon: Pamella PertJagadeesh R Ganji, MD;  [facility];  Service: Cardiovascular;  Laterality: N/A;   LEFT HEART CATHETERIZATION WITH CORONARY ANGIOGRAM N/A 10/17/2012   Procedure: LEFT HEART CATHETERIZATION WITH CORONARY ANGIOGRAM;  Surgeon: Pamella PertJagadeesh R Ganji, MD;  [facility];  Service: Cardiovascular;  Laterality: N/A;   PARTIAL COLECTOMY  1982   PERCUTANEOUS CORONARY STENT INTERVENTION (PCI-S)  07/04/2012   Procedure: PERCUTANEOUS CORONARY STENT INTERVENTION (PCI-S);  Surgeon: Pamella PertJagadeesh R Ganji, MD;  [facility];  Service: Cardiovascular;;   TUBAL LIGATION     Family History  Problem Relation Age of Onset   Hyperlipidemia Sister    Heart attack Sister    Diabetes Daughter    Stroke Father    Heart attack Father    Stroke Sister    Social History   Tobacco Use   Smoking status: Never   Smokeless tobacco: Never  Substance Use Topics   Alcohol use:  No   Marital Status: Single  ROS   Review of Systems  Cardiovascular:  Positive for dyspnea on exertion, leg swelling (bilateral) and palpitations. Negative for chest pain, claudication and syncope.  Respiratory:  Positive for snoring. Negative for shortness of breath.   Skin:  Positive for rash (right ankle).  Musculoskeletal:  Positive for arthritis and joint pain.  Neurological:  Negative for dizziness.  All other systems reviewed and are negative. Objective:  Blood pressure (!) 162/90, pulse 80, temperature 98 F (36.7 C),  temperature source Temporal, resp. rate 16, height 5\' 7"  (1.702 m), weight 280 lb (127 kg), SpO2 94 %.  Vitals with BMI 10/23/2020 10/23/2020 04/21/2020  Height - 5\' 7"  5\' 7"   Weight - 280 lbs 271 lbs 10 oz  BMI - 43.84 42.53  Systolic 162 169 06/19/2020  Diastolic 90 90 89  Pulse - 80 89   Orthostatic VS for the past 72 hrs (Last 3 readings):  Orthostatic BP Patient Position BP Location Cuff Size Orthostatic Pulse  10/23/20 1002 -- Sitting Left Arm -- --  10/23/20 0940 (!) 193/95 Standing Left Arm Large 84  10/23/20 0938 (!) 189/92 Sitting Left Arm Large 77  10/23/20 0937 (!) 189/95 Supine Left Arm Large 74    Physical Exam Constitutional:      General: She is not in acute distress.    Appearance: She is well-developed.     Comments: Morbidly obese  HENT:     Head: Atraumatic.  Neck:     Thyroid: No thyromegaly.     Comments: Short neck and difficult to evaluate JVP Cardiovascular:     Rate and Rhythm: Normal rate and regular rhythm.     Pulses:          Carotid pulses are 2+ on the right side and 2+ on the left side.      Radial pulses are 2+ on the right side and 2+ on the left side.       Dorsalis pedis pulses are 1+ on the right side and 1+ on the left side.       Posterior tibial pulses are 1+ on the right side and 1+ on the left side.     Heart sounds: Normal heart sounds. No murmur heard.   No gallop.  Pulmonary:     Effort: Pulmonary effort is normal.     Breath sounds: Normal breath sounds.  Chest:    Abdominal:     General: Bowel sounds are normal.     Palpations: Abdomen is soft.     Comments: Obese. Pannus present  Musculoskeletal:     Cervical back: Neck supple.     Right lower leg: Edema (1+ pitting) present.     Left lower leg: Edema (1+ pitting) present.     Comments: Significant adipose tissue bilateral lower extremities.  Skin:    General: Skin is warm and dry.  Neurological:     Mental Status: She is alert.   Laboratory examination:   CMP Latest Ref  Rng & Units 04/25/2018 04/18/2018 09/17/2017  Glucose 70 - 99 mg/dL 74 06/24/2018) 06/17/2018)  BUN 8 - 23 mg/dL 16 11 14   Creatinine 0.44 - 1.00 mg/dL 11/18/2017 494(W 967(R  Sodium 135 - 145 mmol/L 141 147(H) 143  Potassium 3.5 - 5.1 mmol/L 3.4(L) 3.7 3.4(L)  Chloride 98 - 111 mmol/L 104 102 102  CO2 22 - 32 mmol/L 28 28 32  Calcium 8.9 - 10.3 mg/dL 9.5 9.1  Total Protein  6.5 - 8.1 g/dL 7.1 - -  Total Bilirubin 0.3 - 1.2 mg/dL 0.9 - -  Alkaline Phos 38 - 126 U/L 43 - -  AST 15 - 41 U/L 19 - -  ALT 0 - 44 U/L 23 - -   CBC Latest Ref Rng & Units 04/25/2018 09/17/2017 06/30/2016  WBC 4.0 - 10.5 K/uL 10.6(H) 10.5 7.9  Hemoglobin 12.0 - 15.0 g/dL 47.0 96.2 83.6  Hematocrit 36.0 - 46.0 % 39.4 38.4 39.1  Platelets 150 - 400 K/uL 345 277 321   Lipid Panel  No results found for: CHOL, TRIG, HDL, CHOLHDL, VLDL, LDLCALC, LDLDIRECT   HEMOGLOBIN A1C No results found for: HGBA1C, MPG TSH No results for input(s): TSH in the last 8760 hours.  External labs: 07/26/2018: HDL 40, LDL 112, total cholesterol 135, triglycerides 77 A1c 5.8% TSH 1.127  Allergies   Allergies  Allergen Reactions   Ciprofloxacin Itching and Rash   Erythromycin Itching and Rash    Medications Prior to Visit:   Outpatient Medications Prior to Visit  Medication Sig Dispense Refill   albuterol (PROVENTIL HFA;VENTOLIN HFA) 108 (90 Base) MCG/ACT inhaler Inhale 2 puffs into the lungs 2 (two) times daily as needed for wheezing or shortness of breath. 1 Inhaler 0   allopurinol (ZYLOPRIM) 300 MG tablet Take 300 mg by mouth as needed.      amLODipine (NORVASC) 10 MG tablet Take 1 tablet (10 mg total) by mouth daily at 12 noon. At noon 90 tablet 3   aspirin EC 81 MG tablet Take 81 mg by mouth daily.     atorvastatin (LIPITOR) 80 MG tablet Take 80 mg by mouth at bedtime.      b complex vitamins tablet Take 1 tablet by mouth daily.     benzonatate (TESSALON) 100 MG capsule Take 100 mg by mouth 3 (three) times daily as needed.      budesonide-formoterol (SYMBICORT) 160-4.5 MCG/ACT inhaler Inhale 2 puffs into the lungs daily.     celecoxib (CELEBREX) 100 MG capsule Take 100 mg by mouth as needed.      cetirizine (ZYRTEC) 10 MG chewable tablet Chew 10 mg by mouth daily.     clobetasol cream (TEMOVATE) 0.05 % Apply 1 application topically as needed. Apply to rash.     Cyanocobalamin (VITAMIN B-12 PO) Take 1 tablet by mouth daily.      fluticasone (FLONASE) 50 MCG/ACT nasal spray Place into the nose as needed.     furosemide (LASIX) 40 MG tablet Take 20 mg by mouth daily as needed for edema. For leg swelling     glimepiride (AMARYL) 4 MG tablet Take 4 mg by mouth daily.     montelukast (SINGULAIR) 10 MG tablet Take 10 mg by mouth daily.     nitroGLYCERIN (NITROSTAT) 0.4 MG SL tablet Place 1 tablet (0.4 mg total) under the tongue every 5 (five) minutes as needed for chest pain. 25 tablet 3   pantoprazole (PROTONIX) 40 MG tablet Take 40 mg by mouth daily.      potassium chloride (K-DUR) 10 MEQ tablet Take 10 mEq by mouth 2 (two) times daily.     PROAIR HFA 108 (90 Base) MCG/ACT inhaler as needed.     metoprolol (TOPROL-XL) 200 MG 24 hr tablet TAKE 1 TABLET BY MOUTH EVERY DAY 90 tablet 1   glipiZIDE (GLUCOTROL) 5 MG tablet Take 4 mg by mouth daily.      hydrALAZINE (APRESOLINE) 50 MG tablet TAKE 1 TABLET BY MOUTH THREE  TIMES A DAY 270 tablet 1   isosorbide dinitrate (ISORDIL) 30 MG tablet Take 1 tablet (30 mg total) by mouth 3 (three) times daily. 90 tablet 2   LINZESS 72 MCG capsule Take 72 mcg by mouth daily.     losartan (COZAAR) 100 MG tablet at bedtime.      pregabalin (LYRICA) 100 MG capsule Take 100 mg by mouth 2 (two) times daily.     spironolactone (ALDACTONE) 50 MG tablet Take 1 tablet (50 mg total) by mouth every morning. 30 tablet 2   No facility-administered medications prior to visit.     Final Medications at End of Visit    Current Meds  Medication Sig   albuterol (PROVENTIL HFA;VENTOLIN HFA) 108 (90 Base)  MCG/ACT inhaler Inhale 2 puffs into the lungs 2 (two) times daily as needed for wheezing or shortness of breath.   allopurinol (ZYLOPRIM) 300 MG tablet Take 300 mg by mouth as needed.    amLODipine (NORVASC) 10 MG tablet Take 1 tablet (10 mg total) by mouth daily at 12 noon. At noon   aspirin EC 81 MG tablet Take 81 mg by mouth daily.   atorvastatin (LIPITOR) 80 MG tablet Take 80 mg by mouth at bedtime.    b complex vitamins tablet Take 1 tablet by mouth daily.   benzonatate (TESSALON) 100 MG capsule Take 100 mg by mouth 3 (three) times daily as needed.   budesonide-formoterol (SYMBICORT) 160-4.5 MCG/ACT inhaler Inhale 2 puffs into the lungs daily.   celecoxib (CELEBREX) 100 MG capsule Take 100 mg by mouth as needed.    cetirizine (ZYRTEC) 10 MG chewable tablet Chew 10 mg by mouth daily.   clobetasol cream (TEMOVATE) 0.05 % Apply 1 application topically as needed. Apply to rash.   Cyanocobalamin (VITAMIN B-12 PO) Take 1 tablet by mouth daily.    fluticasone (FLONASE) 50 MCG/ACT nasal spray Place into the nose as needed.   furosemide (LASIX) 40 MG tablet Take 20 mg by mouth daily as needed for edema. For leg swelling   glimepiride (AMARYL) 4 MG tablet Take 4 mg by mouth daily.   montelukast (SINGULAIR) 10 MG tablet Take 10 mg by mouth daily.   nitroGLYCERIN (NITROSTAT) 0.4 MG SL tablet Place 1 tablet (0.4 mg total) under the tongue every 5 (five) minutes as needed for chest pain.   pantoprazole (PROTONIX) 40 MG tablet Take 40 mg by mouth daily.    potassium chloride (K-DUR) 10 MEQ tablet Take 10 mEq by mouth 2 (two) times daily.   PROAIR HFA 108 (90 Base) MCG/ACT inhaler as needed.   [DISCONTINUED] metoprolol (TOPROL-XL) 200 MG 24 hr tablet TAKE 1 TABLET BY MOUTH EVERY DAY   Radiology:   No results found.  Cardiac Studies:   Coronary angiogram 2014: Patent pLAD stent (4.0X22 mm Integrity BMS) Mid LAD mod stenosis. FFR 0.86 (Nonsignificant) LCx: Ostial 20-30% stenosis. Ostial AV grove  30% stenosis RCA: Mid tandem 20-30% stenosis and 40-50% stenoses.  48 hour holter monitor 04/07/2018:  Normal sinus rhythm. Min HR 60 and Max HR 103. Occasional PVC (4.3% burden) and rare PAC (<1% burden). No A fib or SVT noted. No heart block.   Echocardiogram 04/12/2018: Left ventricle cavity is normal in size. Moderate concentric hypertrophy of the left ventricle. Low normal global wall motion. Visual EF is 50-55%. Doppler evidence of grade I (impaired) diastolic dysfunction, normal LAP. Calculated EF 48%. Mild (Grade I) mitral regurgitation. Mild tricuspid regurgitation. Estimated pulmonary artery systolic pressure 29  mmHg. No  significant change compared to previous study on 06/15/2017.   Lexiscan myoview stress test 06/06/2017:  1. Pharmacologic stress testing was performed with intravenous administration of .4 mg of Lexiscan over a 10-15 seconds infusion. Stress symptoms included chest tightness, dyspnea, dizziness, headache. Normal blood pressure.  Exercise capacity not assessed. Stress EKG is non diagnostic for ischemia as it is a pharmacologic stress.  2. The overall quality of the study is fair. Review of the raw data in a rotational cine format reveals breast attenuation with imaging performed in sitting position. Attenuation is most pronounced on rest images.  Left ventricular cavity is noted to be normal on the rest and stress studies. Gated SPECT images reveal normal myocardial thickening and wall motion.  The left ventricular ejection fraction was calculated or visually estimated to be 65%.  SPECT rest images demonstrate a large area of mild intensity perfusion defect in basal to apical inferior, inferoseptal and apical inferolateral myocardial walls.  Stress SPECT images demonstrate only a small area of mild intensity perfusion defect in apical inferior inferolateral myocardial walls. While perfusion defect on rest images likely represent breast attenuation, perfusion defect, perfusion  defect on stress images could represent small area of mild ischemia.  3. Low risk study.   Lower Extremity Arterial Duplex 05/22/2020:  No hemodynamically significant stenoses are identified in the right or  left lower extremity arterial system.  This exam reveals normal perfusion of the right and left lower extremity  (Rt. ABI 1.14 and Lt. ABI 1.20).  Multiphasic normal waveforms noted at the ankles. Mild heterogeneous  plaque in bilateral lower extremity vessels.   EKG   EKG 10/23/2020: Sinus rhythm with first-degree AV block at a rate of 73 bpm.  Normal axis.  Poor progression, cannot exclude anteroseptal infarct old.  Borderline criteria for LVH.  Compared to EKG 02/18/2021, no significant change.  EKG 02/19/2019: Sinus rhythm with first-degree block at rate of 86 bpm, normal axis.  No evidence of ischemia.  Voltage criteria for LVH.compared to 04/07/2018, frequent PACs not present.   Assessment:     ICD-10-CM   1. Coronary artery disease of native artery of native heart with stable angina pectoris (HCC)  I25.118 EKG 12-Lead    CBC    Basic metabolic panel    Lipid Panel With LDL/HDL Ratio    Hgb A1c w/o eAG    2. Essential hypertension  I10 hydrALAZINE (APRESOLINE) 50 MG tablet    isosorbide dinitrate (ISORDIL) 30 MG tablet    Basic metabolic panel    Lipid Panel With LDL/HDL Ratio    metoprolol (TOPROL-XL) 200 MG 24 hr tablet    3. Palpitations  R00.2 TSH    metoprolol (TOPROL-XL) 200 MG 24 hr tablet    4. Chronic venous insufficiency  I87.2     5. Bilateral leg edema  R60.0 Brain natriuretic peptide    PCV ECHOCARDIOGRAM COMPLETE       Medications Discontinued During This Encounter  Medication Reason   glipiZIDE (GLUCOTROL) 5 MG tablet Change in therapy   hydrALAZINE (APRESOLINE) 50 MG tablet Error   isosorbide dinitrate (ISORDIL) 30 MG tablet Error   LINZESS 72 MCG capsule Error   losartan (COZAAR) 100 MG tablet Error   pregabalin (LYRICA) 100 MG capsule Error    spironolactone (ALDACTONE) 50 MG tablet Error   metoprolol (TOPROL-XL) 200 MG 24 hr tablet Reorder    Meds ordered this encounter  Medications   hydrALAZINE (APRESOLINE) 50 MG tablet    Sig: Take 1  tablet (50 mg total) by mouth 3 (three) times daily.    Dispense:  270 tablet    Refill:  3   isosorbide dinitrate (ISORDIL) 30 MG tablet    Sig: Take 1 tablet (30 mg total) by mouth 3 (three) times daily.    Dispense:  270 tablet    Refill:  3    Discontinue Imdur   metoprolol (TOPROL-XL) 200 MG 24 hr tablet    Sig: Take 1 tablet (200 mg total) by mouth daily.    Dispense:  90 tablet    Refill:  3    DX Code Needed  PLEASE PROVIDE A REFILL.     Recommendations:   Angie Hill  is a 80 y.o.  female  with hypertension, CAD s/p LAD PCI 2014, type 2 DM, OSA-not on CPAP, chronic palpitations much improved on metoprolol, event monitoring January 2020, that revealed no atrial fibrillation, low risk stress test also in March 2019, low normal LVEF by echocardiogram.   Patient presents for 36-month follow-up of hypertension and orthostatic hypotension.  At last visit increase metoprolol succinate from 100 mg 200 mg daily given uncontrolled hypertension and palpitations.  Reviewed and discussed with patient results of ABI, details above.  ABI was normal, no evidence of PAD.  Suspect patient's imbalance is secondary to peripheral neuropathy, will defer further management to PCP.  Patient may benefit from physical therapy as well as the use of a walker when ambulating.  In regard to patient's symptoms of bilateral lower extremity swelling and increased palpitations suspect this is related to uncontrolled hypertension.  Will resume isosorbide dinitrate and hydralazine 3 times daily.  We will obtain basic lab testing as there is no recent labs available for review.If BMP is stable and allows will consider reinitiation of losartan and/or spironolactone as patient has inadvertently discontinued use for an  unknown reason.  Also obtain BNP, CBC, A1c, TSH, and lipid profile testing.  Discussed with patient again regarding need for follow-up sleep study as she has been diagnosed with OSA in the past and is not presently on CPAP.  Patient verbalized understanding and wishes to follow-up with previous provider.  Patient will call previously providers office to set up appointment, however I have made patient aware that I am willing to send new referral if needed.  Given patient's bilateral lower extremity swelling we will also obtain repeat echocardiogram.  Suspect patient's symptoms are driven by uncontrolled hypertension, however if blood pressure control improves and she continues to have symptoms could consider repeat ischemic evaluation, will hold off for now.  Discussed extensively with patient regarding the importance of diet and lifestyle modifications, particularly weight loss.   Follow-up in 4 weeks, sooner if needed, for hypertension, leg swelling, and results of cardiac testing.   Rayford Halsted, PA-C 10/23/2020, 12:18 PM Office: [phone]

## 2020-10-30 ENCOUNTER — Other Ambulatory Visit: Payer: Medicare Other

## 2020-10-31 LAB — HGB A1C W/O EAG: Hgb A1c MFr Bld: 6.1 % — ABNORMAL HIGH (ref 4.8–5.6)

## 2020-10-31 LAB — LIPID PANEL WITH LDL/HDL RATIO
Cholesterol, Total: 243 mg/dL — ABNORMAL HIGH (ref 100–199)
HDL: 49 mg/dL (ref >39)
LDL Chol Calc (NIH): 166 mg/dL — ABNORMAL HIGH (ref 0–99)
LDL/HDL Ratio: 3.4 ratio — ABNORMAL HIGH (ref 0.0–3.2)
Triglycerides: 153 mg/dL — ABNORMAL HIGH (ref 0–149)
VLDL Cholesterol Cal: 28 mg/dL (ref 5–40)

## 2020-10-31 LAB — CBC
Hematocrit: 39.3 % (ref 34.0–46.6)
Hemoglobin: 12.6 g/dL (ref 11.1–15.9)
MCH: 26.3 pg — ABNORMAL LOW (ref 26.6–33.0)
MCHC: 32.1 g/dL (ref 31.5–35.7)
MCV: 82 fL (ref 79–97)
Platelets: 332 10*3/uL (ref 150–450)
RBC: 4.79 x10E6/uL (ref 3.77–5.28)
RDW: 13.4 % (ref 11.7–15.4)
WBC: 7.9 10*3/uL (ref 3.4–10.8)

## 2020-10-31 LAB — BASIC METABOLIC PANEL
BUN/Creatinine Ratio: 15 (ref 12–28)
BUN: 13 mg/dL (ref 8–27)
CO2: 25 mmol/L (ref 20–29)
Calcium: 10.1 mg/dL (ref 8.7–10.3)
Chloride: 103 mmol/L (ref 96–106)
Creatinine, Ser: 0.87 mg/dL (ref 0.57–1.00)
Glucose: 114 mg/dL — ABNORMAL HIGH (ref 65–99)
Potassium: 3.8 mmol/L (ref 3.5–5.2)
Sodium: 143 mmol/L (ref 134–144)
eGFR: 68 mL/min/{1.73_m2} (ref >59)

## 2020-10-31 LAB — BRAIN NATRIURETIC PEPTIDE: BNP: 71.7 pg/mL (ref 0.0–100.0)

## 2020-10-31 LAB — TSH: TSH: 1.47 u[IU]/mL (ref 0.450–4.500)

## 2020-11-04 ENCOUNTER — Other Ambulatory Visit: Payer: Medicare Other

## 2020-11-04 NOTE — Progress Notes (Signed)
CBC, TSH, BMP, BNP normal.  Cholesterol is uncontrolled. Please verify patient is taking atorvastatin. Will discuss further at upcoming OV.  A1c shows prediabetes. Recommend she follow up with a family med provider.   Please get recent blood pressure readings from patient if available and send them to me.

## 2020-11-06 NOTE — Progress Notes (Signed)
Called and discussed with pt. Pt has transportation limitation but would be interested in enrolling in remote BP monitoring at next OV w/ [facility]. Doesn't have a blood pressure monitor at home. Recommended using a nearby grocery store or pharmacy that may have free BP monitor checks to monitor her home BP readings.   Discussed lipitor adherence with pt. Pt confirmed that she has atorvastatin 80 mg tablets at home and uses a weekly pill box to take her prescribed medicines daily. May miss some evening doses 2-3 times/month because she feel asleep too quickly, but is trying to be more cognizant and vigilant about managing her medication regimen. Denies missing any evening atorvastatin dose over the past week.

## 2020-11-06 NOTE — Progress Notes (Signed)
Angie Hill:   Called and spoke with patient regarding her recent lab results. When I asked patient if she was taking her atorvastatin, she laughed and said to let you know she will do better. I did advise her to contact her PCP about following up on her A1C results. Patient does not have a BP machine and has questions about getting one from [place].   Angie Hill:   Patient would like a call back about BP monitoring. Can you please call her back?

## 2020-11-06 NOTE — Progress Notes (Signed)
Will address further at upcoming office visit.

## 2020-11-20 ENCOUNTER — Encounter: Payer: Self-pay | Admitting: Student

## 2020-11-20 ENCOUNTER — Other Ambulatory Visit: Payer: Self-pay

## 2020-11-20 ENCOUNTER — Ambulatory Visit: Payer: Medicare Other | Admitting: Student

## 2020-11-20 ENCOUNTER — Ambulatory Visit: Payer: Medicare Other

## 2020-11-20 VITALS — BP 160/80 | HR 84 | Temp 97.2°F | Ht 67.0 in | Wt 278.0 lb

## 2020-11-20 DIAGNOSIS — I25118 Atherosclerotic heart disease of native coronary artery with other forms of angina pectoris: Secondary | ICD-10-CM

## 2020-11-20 DIAGNOSIS — I10 Essential (primary) hypertension: Secondary | ICD-10-CM

## 2020-11-20 DIAGNOSIS — E7800 Pure hypercholesterolemia, unspecified: Secondary | ICD-10-CM

## 2020-11-20 DIAGNOSIS — R600 Localized edema: Secondary | ICD-10-CM

## 2020-11-20 MED ORDER — LOSARTAN POTASSIUM 100 MG PO TABS: 100.0000 mg | ORAL_TABLET | Freq: Every day | ORAL | 3 refills | Status: DC

## 2020-11-20 NOTE — Progress Notes (Signed)
Primary Physician:  Verdell FaceLim, Sheri, DO   Patient ID: Angie Hill, female    DOB: [birth year], 80 y.o.   MRN: [id number]  Subjective:    Chief Complaint  Patient presents with   Hypertension   Follow-up   Leg Swelling     HPI: Angie HoustonVersie M Dulay  is a 80 y.o. female  with hypertension, CAD s/p LAD PCI 2014, type 2 DM, OSA-not on CPAP, now has been set up for sleep study, chronic palpitations much improved on metoprolol, event monitoring Jan 2020, that revealed no atrial fibrillation, low risk stress test also in March 2019, low normal LVEF by echocardiogram.   Patient presents for 4-week follow-up of hypertension, leg swelling, and cardiac testing.  At last office visit resumed isosorbide dinitrate and hydralazine 3 times daily.  Also last office visit ordered basic lab work, CBC/TH/BMP/BNP normal.  A1c 6.1% (prediabetes).  I also reviewed lipid profile testing, lipids are uncontrolled likely as patient has not been consistently taking statin therapy over the last few months.  Also last office visit ordered repeat echocardiogram which was done earlier today however results are not available yet.  Patient states she has now been compliant with medications as directed.  Tolerating reinitiation of isosorbide dinitrate and hydralazine without issue.  She has not yet resumed losartan or spironolactone which she inadvertently discontinued prior to last office visit.  Patient reports home blood pressure readings remain uncontrolled, however she is asymptomatic from a cardiovascular standpoint.  Denies chest pain, palpitations, syncope, near syncope, dyspnea.    She does continue to have multiple musculoskeletal plaints including knee pain and arm pain.  Notably patient has unfortunately not followed up with sleep provider as discussed at last office visit.  Past Medical History:  Diagnosis Date   Anemia    Angina pectoris (HCC)    Arthritis    Asthma    COPD (chronic obstructive pulmonary disease)  (HCC)    Coronary artery disease    Depression    Diabetes mellitus without complication (HCC)    BORDERLINE   GERD (gastroesophageal reflux disease)    H/O hiatal hernia    Hypertension    Shortness of breath    Sleep apnea    Past Surgical History:  Procedure Laterality Date   ABDOMINAL HYSTERECTOMY     partial   CORONARY ANGIOPLASTY WITH STENT PLACEMENT  07/04/2012   mid LAD   LEFT AND RIGHT HEART CATHETERIZATION WITH CORONARY ANGIOGRAM N/A 07/04/2012   Procedure: LEFT AND RIGHT HEART CATHETERIZATION WITH CORONARY ANGIOGRAM;  Surgeon: Pamella PertJagadeesh R Ganji, MD;  [facility];  Service: Cardiovascular;  Laterality: N/A;   LEFT HEART CATHETERIZATION WITH CORONARY ANGIOGRAM N/A 10/17/2012   Procedure: LEFT HEART CATHETERIZATION WITH CORONARY ANGIOGRAM;  Surgeon: Pamella PertJagadeesh R Ganji, MD;  [facility];  Service: Cardiovascular;  Laterality: N/A;   PARTIAL COLECTOMY  1982   PERCUTANEOUS CORONARY STENT INTERVENTION (PCI-S)  07/04/2012   Procedure: PERCUTANEOUS CORONARY STENT INTERVENTION (PCI-S);  Surgeon: Pamella PertJagadeesh R Ganji, MD;  [facility];  Service: Cardiovascular;;   TUBAL LIGATION     Family History  Problem Relation Age of Onset   Hyperlipidemia Sister    Heart attack Sister    Diabetes Daughter    Stroke Father    Heart attack Father    Stroke Sister    Social History   Tobacco Use   Smoking status: Never   Smokeless tobacco: Never  Substance Use Topics   Alcohol use: No  Marital Status: Single  ROS   Review of Systems  Cardiovascular:  Positive for dyspnea on exertion (improving) and leg swelling (bilateral, improving). Negative for chest pain, claudication, palpitations and syncope.  Respiratory:  Positive for snoring. Negative for shortness of breath.   Skin:  Positive for rash (right ankle).  Musculoskeletal:  Positive for arthritis and joint pain.  Neurological:  Negative for dizziness.  All other systems reviewed and are  negative. Objective:  Blood pressure (!) 160/80, pulse 84, temperature (!) 97.2 F (36.2 C), height 5\' 7"  (1.702 m), weight 278 lb (126.1 kg), SpO2 99 %.  Vitals with BMI 11/20/2020 11/20/2020 10/23/2020  Height - 5\' 7"  -  Weight - 278 lbs -  BMI - 43.53 -  Systolic 160 184 12/23/2020  Diastolic 80 84 90  Pulse - 84 -    Physical Exam Constitutional:      General: She is not in acute distress.    Appearance: She is well-developed.     Comments: Morbidly obese  HENT:     Head: Atraumatic.  Neck:     Thyroid: No thyromegaly.     Comments: Short neck and difficult to evaluate JVP Cardiovascular:     Rate and Rhythm: Normal rate and regular rhythm.     Pulses:          Carotid pulses are 2+ on the right side and 2+ on the left side.      Radial pulses are 2+ on the right side and 2+ on the left side.       Dorsalis pedis pulses are 1+ on the right side and 1+ on the left side.       Posterior tibial pulses are 1+ on the right side and 1+ on the left side.     Heart sounds: Normal heart sounds. No murmur heard.   No gallop.  Pulmonary:     Effort: Pulmonary effort is normal.     Breath sounds: Normal breath sounds.  Abdominal:     Comments: Obese. Pannus present  Musculoskeletal:     Cervical back: Neck supple.     Right lower leg: Edema (minimal) present.     Left lower leg: Edema (minimal) present.     Comments: Significant adipose tissue bilateral lower extremities.  Skin:    General: Skin is warm and dry.  Neurological:     Mental Status: She is alert.   Laboratory examination:   CMP Latest Ref Rng & Units 10/30/2020 04/25/2018 04/18/2018  Glucose 65 - 99 mg/dL 06/24/2018) 74 06/17/2018)  BUN 8 - 27 mg/dL 13 16 11   Creatinine 0.57 - 1.00 mg/dL 067(P 034(K  Sodium 134 - 144 mmol/L 143 141 147(H)  Potassium 3.5 - 5.2 mmol/L 3.8 3.4(L) 3.7  Chloride 96 - 106 mmol/L 103 104 102  CO2 20 - 29 mmol/L 25 28 28   Calcium 8.7 - 10.3 mg/dL 3.52 9.5 4.81  Total Protein 6.5 - 8.1 g/dL - 7.1 -   Total Bilirubin 0.3 - 1.2 mg/dL - 0.9 -  Alkaline Phos 38 - 126 U/L - 43 -  AST 15 - 41 U/L - 19 -  ALT 0 - 44 U/L - 23 -   CBC Latest Ref Rng & Units 10/30/2020 04/25/2018 09/17/2017  WBC 3.4 - 10.8 x10E3/uL 7.9 10.6(H) 10.5  Hemoglobin 11.1 - 15.9 g/dL 11.2 11/01/2020 06/24/2018  Hematocrit 34.0 - 46.6 % 39.3 39.4 38.4  Platelets 150 - 450 x10E3/uL 332 345 277   Lipid  Panel     Component Value Date/Time   CHOL 243 (H) 10/30/2020 0839   TRIG 153 (H) 10/30/2020 0839   HDL 49 10/30/2020 0839   LDLCALC 166 (H) 10/30/2020 0839     HEMOGLOBIN A1C Lab Results  Component Value Date   HGBA1C 6.1 (H) 10/30/2020   TSH Recent Labs    10/30/20 0839  TSH 1.470    External labs: 07/26/2018: HDL 40, LDL 112, total cholesterol 135, triglycerides 77 A1c 5.8% TSH 1.127  Allergies   Allergies  Allergen Reactions   Ciprofloxacin Itching and Rash   Erythromycin Itching and Rash    Medications Prior to Visit:   Outpatient Medications Prior to Visit  Medication Sig Dispense Refill   albuterol (PROVENTIL HFA;VENTOLIN HFA) 108 (90 Base) MCG/ACT inhaler Inhale 2 puffs into the lungs 2 (two) times daily as needed for wheezing or shortness of breath. 1 Inhaler 0   allopurinol (ZYLOPRIM) 300 MG tablet Take 300 mg by mouth as needed.      amLODipine (NORVASC) 10 MG tablet Take 1 tablet (10 mg total) by mouth daily at 12 noon. At noon 90 tablet 3   aspirin EC 81 MG tablet Take 81 mg by mouth daily.     atorvastatin (LIPITOR) 80 MG tablet Take 80 mg by mouth at bedtime.      b complex vitamins tablet Take 1 tablet by mouth daily.     benzonatate (TESSALON) 100 MG capsule Take 100 mg by mouth 3 (three) times daily as needed.     budesonide-formoterol (SYMBICORT) 160-4.5 MCG/ACT inhaler Inhale 2 puffs into the lungs daily.     celecoxib (CELEBREX) 100 MG capsule Take 100 mg by mouth as needed.      cetirizine (ZYRTEC) 10 MG chewable tablet Chew 10 mg by mouth daily.     clobetasol cream (TEMOVATE) 0.05 %  Apply 1 application topically as needed. Apply to rash.     Cyanocobalamin (VITAMIN B-12 PO) Take 1 tablet by mouth daily.      fluticasone (FLONASE) 50 MCG/ACT nasal spray Place into the nose as needed.     furosemide (LASIX) 40 MG tablet Take 20 mg by mouth daily as needed for edema. For leg swelling     glimepiride (AMARYL) 4 MG tablet Take 4 mg by mouth daily.     hydrALAZINE (APRESOLINE) 50 MG tablet Take 1 tablet (50 mg total) by mouth 3 (three) times daily. 270 tablet 3   isosorbide dinitrate (ISORDIL) 30 MG tablet Take 1 tablet (30 mg total) by mouth 3 (three) times daily. 270 tablet 3   metoprolol (TOPROL-XL) 200 MG 24 hr tablet Take 1 tablet (200 mg total) by mouth daily. 90 tablet 3   montelukast (SINGULAIR) 10 MG tablet Take 10 mg by mouth daily.     nitroGLYCERIN (NITROSTAT) 0.4 MG SL tablet Place 1 tablet (0.4 mg total) under the tongue every 5 (five) minutes as needed for chest pain. 25 tablet 3   pantoprazole (PROTONIX) 40 MG tablet Take 40 mg by mouth daily.      potassium chloride (K-DUR) 10 MEQ tablet Take 10 mEq by mouth 2 (two) times daily.     PROAIR HFA 108 (90 Base) MCG/ACT inhaler as needed.     No facility-administered medications prior to visit.     Final Medications at End of Visit    Current Meds  Medication Sig   albuterol (PROVENTIL HFA;VENTOLIN HFA) 108 (90 Base) MCG/ACT inhaler Inhale 2 puffs into the lungs  2 (two) times daily as needed for wheezing or shortness of breath.   allopurinol (ZYLOPRIM) 300 MG tablet Take 300 mg by mouth as needed.    amLODipine (NORVASC) 10 MG tablet Take 1 tablet (10 mg total) by mouth daily at 12 noon. At noon   aspirin EC 81 MG tablet Take 81 mg by mouth daily.   atorvastatin (LIPITOR) 80 MG tablet Take 80 mg by mouth at bedtime.    b complex vitamins tablet Take 1 tablet by mouth daily.   benzonatate (TESSALON) 100 MG capsule Take 100 mg by mouth 3 (three) times daily as needed.   budesonide-formoterol (SYMBICORT) 160-4.5  MCG/ACT inhaler Inhale 2 puffs into the lungs daily.   celecoxib (CELEBREX) 100 MG capsule Take 100 mg by mouth as needed.    cetirizine (ZYRTEC) 10 MG chewable tablet Chew 10 mg by mouth daily.   clobetasol cream (TEMOVATE) 0.05 % Apply 1 application topically as needed. Apply to rash.   Cyanocobalamin (VITAMIN B-12 PO) Take 1 tablet by mouth daily.    fluticasone (FLONASE) 50 MCG/ACT nasal spray Place into the nose as needed.   furosemide (LASIX) 40 MG tablet Take 20 mg by mouth daily as needed for edema. For leg swelling   glimepiride (AMARYL) 4 MG tablet Take 4 mg by mouth daily.   hydrALAZINE (APRESOLINE) 50 MG tablet Take 1 tablet (50 mg total) by mouth 3 (three) times daily.   isosorbide dinitrate (ISORDIL) 30 MG tablet Take 1 tablet (30 mg total) by mouth 3 (three) times daily.   losartan (COZAAR) 100 MG tablet Take 1 tablet (100 mg total) by mouth daily.   metoprolol (TOPROL-XL) 200 MG 24 hr tablet Take 1 tablet (200 mg total) by mouth daily.   montelukast (SINGULAIR) 10 MG tablet Take 10 mg by mouth daily.   nitroGLYCERIN (NITROSTAT) 0.4 MG SL tablet Place 1 tablet (0.4 mg total) under the tongue every 5 (five) minutes as needed for chest pain.   pantoprazole (PROTONIX) 40 MG tablet Take 40 mg by mouth daily.    potassium chloride (K-DUR) 10 MEQ tablet Take 10 mEq by mouth 2 (two) times daily.   PROAIR HFA 108 (90 Base) MCG/ACT inhaler as needed.   Radiology:   No results found.  Cardiac Studies:   Coronary angiogram 2014: Patent pLAD stent (4.0X22 mm Integrity BMS) Mid LAD mod stenosis. FFR 0.86 (Nonsignificant) LCx: Ostial 20-30% stenosis. Ostial AV grove 30% stenosis RCA: Mid tandem 20-30% stenosis and 40-50% stenoses.  48 hour holter monitor 04/07/2018:  Normal sinus rhythm. Min HR 60 and Max HR 103. Occasional PVC (4.3% burden) and rare PAC (<1% burden). No A fib or SVT noted. No heart block.   Echocardiogram 04/12/2018: Left ventricle cavity is normal in size.  Moderate concentric hypertrophy of the left ventricle. Low normal global wall motion. Visual EF is 50-55%. Doppler evidence of grade I (impaired) diastolic dysfunction, normal LAP. Calculated EF 48%. Mild (Grade I) mitral regurgitation. Mild tricuspid regurgitation. Estimated pulmonary artery systolic pressure 29  mmHg. No significant change compared to previous study on 06/15/2017.   Lexiscan myoview stress test 06/06/2017:  1. Pharmacologic stress testing was performed with intravenous administration of .4 mg of Lexiscan over a 10-15 seconds infusion. Stress symptoms included chest tightness, dyspnea, dizziness, headache. Normal blood pressure.  Exercise capacity not assessed. Stress EKG is non diagnostic for ischemia as it is a pharmacologic stress.  2. The overall quality of the study is fair. Review of the raw data in  a rotational cine format reveals breast attenuation with imaging performed in sitting position. Attenuation is most pronounced on rest images.  Left ventricular cavity is noted to be normal on the rest and stress studies. Gated SPECT images reveal normal myocardial thickening and wall motion.  The left ventricular ejection fraction was calculated or visually estimated to be 65%.  SPECT rest images demonstrate a large area of mild intensity perfusion defect in basal to apical inferior, inferoseptal and apical inferolateral myocardial walls.  Stress SPECT images demonstrate only a small area of mild intensity perfusion defect in apical inferior inferolateral myocardial walls. While perfusion defect on rest images likely represent breast attenuation, perfusion defect, perfusion defect on stress images could represent small area of mild ischemia.  3. Low risk study.   Lower Extremity Arterial Duplex 05/22/2020:  No hemodynamically significant stenoses are identified in the right or  left lower extremity arterial system.  This exam reveals normal perfusion of the right and left lower  extremity  (Rt. ABI 1.14 and Lt. ABI 1.20).  Multiphasic normal waveforms noted at the ankles. Mild heterogeneous  plaque in bilateral lower extremity vessels.   EKG   EKG 10/23/2020: Sinus rhythm with first-degree AV block at a rate of 73 bpm.  Normal axis.  Poor progression, cannot exclude anteroseptal infarct old.  Borderline criteria for LVH.  Compared to EKG 02/18/2021, no significant change.  EKG 02/19/2019: Sinus rhythm with first-degree block at rate of 86 bpm, normal axis.  No evidence of ischemia.  Voltage criteria for LVH.compared to 04/07/2018, frequent PACs not present.   Assessment:     ICD-10-CM   1. Essential hypertension  I10 Basic metabolic panel    2. Coronary artery disease of native artery of native heart with stable angina pectoris (HCC)  I25.118 Lipid Panel With LDL/HDL Ratio    Lipid Panel With LDL/HDL Ratio    3. Hypercholesterolemia  E78.00        There are no discontinued medications.   Meds ordered this encounter  Medications   losartan (COZAAR) 100 MG tablet    Sig: Take 1 tablet (100 mg total) by mouth daily.    Dispense:  90 tablet    Refill:  3     Recommendations:   ANALA WHISENANT  is a 80 y.o.  female  with hypertension, CAD s/p LAD PCI 2014, type 2 DM, OSA-not on CPAP, chronic palpitations much improved on metoprolol, event monitoring January 2020, that revealed no atrial fibrillation, low risk stress test also in March 2019, low normal LVEF by echocardiogram.   Patient presents for 4-week follow-up.  Repeat echocardiogram results are pending, our office will call patient with result details.  Patient is tolerating addition of isosorbide dinitrate and hydralazine without issue, however her blood pressure remains uncontrolled.  We will restart losartan 100 mg once daily as she was previously on this.  We will repeat BMP in 1 week.  Reviewed and discussed with patient results of recent recent lab testing.  Lipids remain uncontrolled.  Again  discussed with patient the importance of diet and lifestyle modifications as well as medication compliance including statin therapy.  We will plan to repeat lipid profile testing prior to next office visit.  Also encouraged patient to follow-up with for CPAP study with previous provider, she agrees to do so.  Patient's bilateral lower leg edema has improved since last visit.  Will now enrolled patient in remote patient monitoring of blood pressure in order to improve blood pressure control.  Follow-up in 3 months, sooner if needed. However will follow blood pressure closely with remote monitoring.    Rayford Halsted, PA-C 11/20/2020, 1:53 PM Office: [phone]

## 2020-11-24 NOTE — Progress Notes (Signed)
Please mail echocardiogram results to patient.  Also please call patient to notify her that echo is unchanged compared to previous in 2020.

## 2020-11-25 NOTE — Progress Notes (Signed)
Called pt to inform her about her echo results, will mail echo results

## 2021-01-12 ENCOUNTER — Encounter: Payer: Self-pay | Admitting: Pharmacist

## 2021-01-12 NOTE — Progress Notes (Signed)
CARE PLAN ENTRY  01/12/2021 Name: Angie Hill MRN: [id number] DOB: 1940/12/09  [name] is enrolled in Remote Patient Monitoring/Principle Care Monitoring.  Date of Enrollment: 11/20/2020 Supervising physician: Yates Decamp Indication: HTN  Remote Readings: Compliant and Avg BP: 149/81, HR:74  Next scheduled OV: 02/19/21  Pharmacist Clinical Goal(s):  Over the next 90 days, patient will demonstrate Improved medication adherence as evidenced by medication fill history Over the next 90 days, patient will demonstrate improved understanding of prescribed medications and rationale for usage as evidenced by patient teach back Over the next 90 days, patient will experience decrease in ED visits. ED visits in last 6 months = 0 Over the next 90 days, patient will not experience hospital admission. Hospital Admissions in last 6 months = 0  Interventions: Provider and Inter-disciplinary care team collaboration (see longitudinal plan of care) Comprehensive medication review performed. Discussed plans with patient for ongoing care management follow up and provided patient with direct contact information for care management team Collaboration with provider re: medication management  Patient Self Care Activities:  Self administers medications as prescribed Attends all scheduled provider appointments Performs ADL's independently Performs IADL's independently  Allergies  Allergen Reactions   Ciprofloxacin Itching and Rash   Erythromycin Itching and Rash   Outpatient Encounter Medications as of 01/12/2021  Medication Sig Note   albuterol (PROVENTIL HFA;VENTOLIN HFA) 108 (90 Base) MCG/ACT inhaler Inhale 2 puffs into the lungs 2 (two) times daily as needed for wheezing or shortness of breath.    allopurinol (ZYLOPRIM) 300 MG tablet Take 300 mg by mouth as needed.     amLODipine (NORVASC) 10 MG tablet Take 1 tablet (10 mg total) by mouth daily at 12 noon. At noon    aspirin EC 81 MG  tablet Take 81 mg by mouth daily.    atorvastatin (LIPITOR) 80 MG tablet Take 80 mg by mouth at bedtime.     b complex vitamins tablet Take 1 tablet by mouth daily.    benzonatate (TESSALON) 100 MG capsule Take 100 mg by mouth 3 (three) times daily as needed.    budesonide-formoterol (SYMBICORT) 160-4.5 MCG/ACT inhaler Inhale 2 puffs into the lungs daily.    celecoxib (CELEBREX) 100 MG capsule Take 100 mg by mouth as needed.     cetirizine (ZYRTEC) 10 MG chewable tablet Chew 10 mg by mouth daily.    clobetasol cream (TEMOVATE) 0.05 % Apply 1 application topically as needed. Apply to rash.    Cyanocobalamin (VITAMIN B-12 PO) Take 1 tablet by mouth daily.     fluticasone (FLONASE) 50 MCG/ACT nasal spray Place into the nose as needed.    furosemide (LASIX) 40 MG tablet Take 20 mg by mouth daily as needed for edema. For leg swelling    glimepiride (AMARYL) 4 MG tablet Take 4 mg by mouth daily.    hydrALAZINE (APRESOLINE) 50 MG tablet Take 1 tablet (50 mg total) by mouth 3 (three) times daily.    isosorbide dinitrate (ISORDIL) 30 MG tablet Take 1 tablet (30 mg total) by mouth 3 (three) times daily.    losartan (COZAAR) 100 MG tablet Take 1 tablet (100 mg total) by mouth daily.    metoprolol (TOPROL-XL) 200 MG 24 hr tablet Take 1 tablet (200 mg total) by mouth daily.    montelukast (SINGULAIR) 10 MG tablet Take 10 mg by mouth daily.    nitroGLYCERIN (NITROSTAT) 0.4 MG SL tablet Place 1 tablet (0.4 mg total) under the tongue every 5 (five) minutes  as needed for chest pain.    pantoprazole (PROTONIX) 40 MG tablet Take 40 mg by mouth daily.  10/31/2014: Received from: [facility]   potassium chloride (K-DUR) 10 MEQ tablet Take 10 mEq by mouth 2 (two) times daily.    PROAIR HFA 108 (90 Base) MCG/ACT inhaler as needed.    No facility-administered encounter medications on file as of 01/12/2021.    Hypertension   BP goal is:  <130/80  Office blood pressures are  BP Readings from Last 3  Encounters:  11/20/20 (!) 160/80  10/23/20 (!) 162/90  04/21/20 (!) 159/89    Patient is currently uncontrolled on the following medications: losartan 100 mg, metoprolol 200 mg, isordil 30 mg TID, hydralazine 50 mg TID, amlodipine 10 mg, lasix 20 mg PRN,   Patient checks BP at home daily  Patient home BP readings are ranging: 134-166/67-93  Patient has tried  these meds in the past: amlodipine-valsartan, nebivolol, BiDIl, diltiazem, spironolactone.   We discussed diet and exercise extensively  Plan  Continue current medications and control with diet and exercise   Reviewed with pt. Pt reports that she is tolerating new start losartan without any noted ADRs. Pt hasnt been able to go to [facility] since starting the Rx to check renal and potassium level. Pt agreeable to go sometime next week. Pt recently lost her son and is grieving. Reports BP elevation possibly associated with the grief and stress. Pt reports that she is grieving but trying her best to manage her health needs. Will wait till pt get lab work completed. If BP continues to remain elevated or trend up, may consider restarting previous spironolactone Rx.   ______________ Visit Information SDOH (Social Determinants of Health) assessments performed: Yes.  Ms. Vogan was given information about Principle Care Management/Remote Patient Monitoring services today including:  RPM/PCM service includes personalized support from designated clinical staff supervised by her physician, including individualized plan of care and coordination with other care providers 24/7 contact phone numbers for assistance for urgent and routine care needs. Standard insurance, coinsurance, copays and deductibles apply for principle care management only during months in which we provide at least 30 minutes of these services. Most insurances cover these services at 100%, however patients may be responsible for any copay, coinsurance and/or deductible if  applicable. This service may help you avoid the need for more expensive face-to-face services. Only one practitioner may furnish and bill the service in a calendar month. The patient may stop PCM/RPM services at any time (effective at the end of the month) by phone call to the office staff.  Patient agreed to services and verbal consent obtained.   Cassell Clement, Pharm.D. Clinical Pharmacist [facility] [phone] Ext: [phone]

## 2021-02-18 NOTE — Progress Notes (Signed)
Primary Physician:  Vincenza Hews, NP   Patient ID: Angie Hill, female    DOB: January 20, 1941, 80 y.o.   MRN: [id number]  Subjective:    Chief Complaint  Patient presents with   Coronary Artery Disease   Follow-up    3 month     HPI: Angie Hill  is a 80 y.o. female  with hypertension, CAD s/p LAD PCI 2014, type 2 DM, OSA-not on CPAP, now has been set up for sleep study, chronic palpitations much improved on metoprolol, event monitoring Jan 2020, that revealed no atrial fibrillation, low risk stress test also in March 2019, low normal LVEF by echocardiogram.   Patient presents for 98-month follow-up.  At last office visit started losartan 100 mg once daily due to uncontrolled hypertension, repeat BMP has unfortunately not been done.  At last visit has also ordered repeat lipid profile testing, which has not been done.  Patient has been having difficulty with remote blood pressure monitoring, therefore recent data is not available, however her blood pressure had started to trend down at last monitoring.  She also unfortunately has not followed up with PCP regarding CPAP.  She is not taking statin daily without issue.  Notably patient is presently being treated for skin infection by PCP.  Patient's son also recently died.  Today patient also has multiple musculoskeletal complaints including knee pain, arm pain, and back pain.  Past Medical History:  Diagnosis Date   Anemia    Angina pectoris (HCC)    Arthritis    Asthma    COPD (chronic obstructive pulmonary disease) ([place])    Coronary artery disease    Depression    Diabetes mellitus without complication (HCC)    BORDERLINE   GERD (gastroesophageal reflux disease)    H/O hiatal hernia    Hypertension    Shortness of breath    Sleep apnea    Past Surgical History:  Procedure Laterality Date   ABDOMINAL HYSTERECTOMY     partial   CORONARY ANGIOPLASTY WITH STENT PLACEMENT  07/04/2012   mid LAD   LEFT AND RIGHT HEART  CATHETERIZATION WITH CORONARY ANGIOGRAM N/A 07/04/2012   Procedure: LEFT AND RIGHT HEART CATHETERIZATION WITH CORONARY ANGIOGRAM;  Surgeon: Laverda Page, MD;  Location: [facility] CATH LAB;  Service: Cardiovascular;  Laterality: N/A;   LEFT HEART CATHETERIZATION WITH CORONARY ANGIOGRAM N/A 10/17/2012   Procedure: LEFT HEART CATHETERIZATION WITH CORONARY ANGIOGRAM;  Surgeon: Laverda Page, MD;  Location: [facility] CATH LAB;  Service: Cardiovascular;  Laterality: N/A;   PARTIAL COLECTOMY  1982   PERCUTANEOUS CORONARY STENT INTERVENTION (PCI-S)  07/04/2012   Procedure: PERCUTANEOUS CORONARY STENT INTERVENTION (PCI-S);  Surgeon: Laverda Page, MD;  Location: [facility] CATH LAB;  Service: Cardiovascular;;   TUBAL LIGATION     Family History  Problem Relation Age of Onset   Hyperlipidemia Sister    Heart attack Sister    Diabetes Daughter    Stroke Father    Heart attack Father    Stroke Sister    Social History   Tobacco Use   Smoking status: Never   Smokeless tobacco: Never  Substance Use Topics   Alcohol use: No   Marital Status: Single  ROS   Review of Systems  Cardiovascular:  Positive for dyspnea on exertion (improved, stable) and leg swelling (improved). Negative for chest pain, claudication, palpitations and syncope.  Respiratory:  Positive for snoring. Negative for shortness of breath.   Skin:  Positive for rash (right  ankle).  Musculoskeletal:  Positive for arthritis and joint pain.  Neurological:  Negative for dizziness.  All other systems reviewed and are negative. Objective:  Blood pressure (!) 157/82, pulse 67, temperature 98 F (36.7 C), resp. rate 16, height 5\' 7"  (1.702 m), weight 277 lb (125.6 kg), SpO2 98 %.  Vitals with BMI 02/19/2021 02/19/2021 11/20/2020  Height - 5\' 7"  -  Weight - 277 lbs -  BMI - [id number] -  Systolic [id number]  Diastolic 82 79 80  Pulse 67 67 -    Physical Exam Constitutional:      General: She is not in acute distress.    Appearance: She is  well-developed.     Comments: Morbidly obese  Neck:     Thyroid: No thyromegaly.     Comments: Short neck and difficult to evaluate JVP Cardiovascular:     Rate and Rhythm: Normal rate and regular rhythm.     Pulses:          Carotid pulses are 2+ on the right side and 2+ on the left side.      Radial pulses are 2+ on the right side and 2+ on the left side.       Dorsalis pedis pulses are 1+ on the right side and 1+ on the left side.       Posterior tibial pulses are 1+ on the right side and 1+ on the left side.     Heart sounds: Normal heart sounds. No murmur heard.   No gallop.  Pulmonary:     Effort: Pulmonary effort is normal.     Breath sounds: Normal breath sounds.  Abdominal:     Comments: Obese. Pannus present  Musculoskeletal:     Cervical back: Neck supple.     Right lower leg: Edema (minimal) present.     Left lower leg: Edema (minimal) present.     Comments: Significant adipose tissue bilateral lower extremities.  Skin:    Comments: Open wound right ankle  Neurological:     Mental Status: She is alert.   Laboratory examination:   CMP Latest Ref Rng & Units 10/30/2020 04/25/2018 04/18/2018  Glucose 65 - 99 mg/dL 114(H) 74 106(H)  BUN 8 - 27 mg/dL 13 16 11   Creatinine 0.57 - 1.00 mg/dL 0.87 0.58 0.83  Sodium 134 - 144 mmol/L 143 141 147(H)  Potassium 3.5 - 5.2 mmol/L 3.8 3.4(L) 3.7  Chloride 96 - 106 mmol/L 103 104 102  CO2 20 - 29 mmol/L 25 28 28   Calcium 8.7 - 10.3 mg/dL 10.1 9.5 10.1  Total Protein 6.5 - 8.1 g/dL - 7.1 -  Total Bilirubin 0.3 - 1.2 mg/dL - 0.9 -  Alkaline Phos 38 - 126 U/L - 43 -  AST 15 - 41 U/L - 19 -  ALT 0 - 44 U/L - 23 -   CBC Latest Ref Rng & Units 10/30/2020 04/25/2018 09/17/2017  WBC 3.4 - 10.8 x10E3/uL 7.9 10.6(H) 10.5  Hemoglobin 11.1 - 15.9 g/dL 12.6 12.1 12.5  Hematocrit 34.0 - 46.6 % 39.3 39.4 38.4  Platelets 150 - 450 x10E3/uL 332 345 277   Lipid Panel     Component Value Date/Time   CHOL 243 (H) 10/30/2020 0839   TRIG 153 (H)  10/30/2020 0839   HDL 49 10/30/2020 0839   LDLCALC 166 (H) 10/30/2020 0839     HEMOGLOBIN A1C Lab Results  Component Value Date   HGBA1C 6.1 (H) 10/30/2020   TSH Recent Labs  10/30/20 0839  TSH 1.470    External labs: 07/26/2018: HDL 40, LDL 112, total cholesterol 135, triglycerides 77 A1c 5.8% TSH 1.127  Allergies   Allergies  Allergen Reactions   Ciprofloxacin Itching and Rash   Erythromycin Itching and Rash    Medications Prior to Visit:   Outpatient Medications Prior to Visit  Medication Sig Dispense Refill   albuterol (PROVENTIL HFA;VENTOLIN HFA) 108 (90 Base) MCG/ACT inhaler Inhale 2 puffs into the lungs 2 (two) times daily as needed for wheezing or shortness of breath. 1 Inhaler 0   allopurinol (ZYLOPRIM) 300 MG tablet Take 300 mg by mouth as needed.      amLODipine (NORVASC) 10 MG tablet Take 1 tablet (10 mg total) by mouth daily at 12 noon. At noon 90 tablet 3   aspirin EC 81 MG tablet Take 81 mg by mouth daily.     atorvastatin (LIPITOR) 80 MG tablet Take 80 mg by mouth at bedtime.      b complex vitamins tablet Take 1 tablet by mouth daily.     budesonide-formoterol (SYMBICORT) 160-4.5 MCG/ACT inhaler Inhale 2 puffs into the lungs daily.     cephALEXin (KEFLEX) 750 MG capsule Take 750 mg by mouth 2 (two) times daily.     cetirizine (ZYRTEC) 10 MG chewable tablet Chew 10 mg by mouth daily.     Cyanocobalamin (VITAMIN B-12 PO) Take 1 tablet by mouth daily.      furosemide (LASIX) 40 MG tablet Take 20 mg by mouth daily as needed for edema. For leg swelling     gabapentin (NEURONTIN) 600 MG tablet Take 600 mg by mouth daily.     isosorbide dinitrate (ISORDIL) 30 MG tablet Take 1 tablet (30 mg total) by mouth 3 (three) times daily. 270 tablet 3   losartan (COZAAR) 100 MG tablet Take 1 tablet (100 mg total) by mouth daily. 90 tablet 3   metoprolol (TOPROL-XL) 200 MG 24 hr tablet Take 1 tablet (200 mg total) by mouth daily. 90 tablet 3   montelukast (SINGULAIR)  10 MG tablet Take 10 mg by mouth daily.     nitroGLYCERIN (NITROSTAT) 0.4 MG SL tablet Place 1 tablet (0.4 mg total) under the tongue every 5 (five) minutes as needed for chest pain. 25 tablet 3   pantoprazole (PROTONIX) 40 MG tablet Take 40 mg by mouth daily.      potassium chloride (K-DUR) 10 MEQ tablet Take 10 mEq by mouth 2 (two) times daily.     PROAIR HFA 108 (90 Base) MCG/ACT inhaler as needed.     promethazine (PHENERGAN) 25 MG tablet Unable to verify     sertraline (ZOLOFT) 25 MG tablet Take 25 mg by mouth daily.     traMADol (ULTRAM) 50 MG tablet Take 50 mg by mouth daily.     traZODone (DESYREL) 50 MG tablet Take 50 mg by mouth at bedtime.     hydrALAZINE (APRESOLINE) 50 MG tablet Take 1 tablet (50 mg total) by mouth 3 (three) times daily. 270 tablet 3   benzonatate (TESSALON) 100 MG capsule Take 100 mg by mouth 3 (three) times daily as needed.     celecoxib (CELEBREX) 100 MG capsule Take 100 mg by mouth as needed.      clobetasol cream (TEMOVATE) [id number] % Apply 1 application topically as needed. Apply to rash.     fluticasone (FLONASE) 50 MCG/ACT nasal spray Place into the nose as needed.     glimepiride (AMARYL) 4 MG tablet Take 4  mg by mouth daily.     No facility-administered medications prior to visit.   Final Medications at End of Visit    Current Meds  Medication Sig   albuterol (PROVENTIL HFA;VENTOLIN HFA) 108 (90 Base) MCG/ACT inhaler Inhale 2 puffs into the lungs 2 (two) times daily as needed for wheezing or shortness of breath.   allopurinol (ZYLOPRIM) 300 MG tablet Take 300 mg by mouth as needed.    amLODipine (NORVASC) 10 MG tablet Take 1 tablet (10 mg total) by mouth daily at 12 noon. At noon   aspirin EC 81 MG tablet Take 81 mg by mouth daily.   atorvastatin (LIPITOR) 80 MG tablet Take 80 mg by mouth at bedtime.    b complex vitamins tablet Take 1 tablet by mouth daily.   budesonide-formoterol (SYMBICORT) 160-4.5 MCG/ACT inhaler Inhale 2 puffs into the lungs daily.    cephALEXin (KEFLEX) 750 MG capsule Take 750 mg by mouth 2 (two) times daily.   cetirizine (ZYRTEC) 10 MG chewable tablet Chew 10 mg by mouth daily.   Cyanocobalamin (VITAMIN B-12 PO) Take 1 tablet by mouth daily.    furosemide (LASIX) 40 MG tablet Take 20 mg by mouth daily as needed for edema. For leg swelling   gabapentin (NEURONTIN) 600 MG tablet Take 600 mg by mouth daily.   isosorbide dinitrate (ISORDIL) 30 MG tablet Take 1 tablet (30 mg total) by mouth 3 (three) times daily.   losartan (COZAAR) 100 MG tablet Take 1 tablet (100 mg total) by mouth daily.   metoprolol (TOPROL-XL) 200 MG 24 hr tablet Take 1 tablet (200 mg total) by mouth daily.   montelukast (SINGULAIR) 10 MG tablet Take 10 mg by mouth daily.   nitroGLYCERIN (NITROSTAT) 0.4 MG SL tablet Place 1 tablet (0.4 mg total) under the tongue every 5 (five) minutes as needed for chest pain.   pantoprazole (PROTONIX) 40 MG tablet Take 40 mg by mouth daily.    potassium chloride (K-DUR) 10 MEQ tablet Take 10 mEq by mouth 2 (two) times daily.   PROAIR HFA 108 (90 Base) MCG/ACT inhaler as needed.   promethazine (PHENERGAN) 25 MG tablet Unable to verify   sertraline (ZOLOFT) 25 MG tablet Take 25 mg by mouth daily.   traMADol (ULTRAM) 50 MG tablet Take 50 mg by mouth daily.   traZODone (DESYREL) 50 MG tablet Take 50 mg by mouth at bedtime.   [DISCONTINUED] hydrALAZINE (APRESOLINE) 50 MG tablet Take 1 tablet (50 mg total) by mouth 3 (three) times daily.   Radiology:   No results found.  Cardiac Studies:   Coronary angiogram 2014: Patent pLAD stent (4.0X22 mm Integrity BMS) Mid LAD mod stenosis. FFR 0.86 (Nonsignificant) LCx: Ostial 20-30% stenosis. Ostial AV grove 30% stenosis RCA: Mid tandem 20-30% stenosis and 40-50% stenoses.  48 hour holter monitor 04/07/2018:  Normal sinus rhythm. Min HR 60 and Max HR 103. Occasional PVC (4.3% burden) and rare PAC (<1% burden). No A fib or SVT noted. No heart block.    Lexiscan myoview  stress test 06/06/2017:  1. Pharmacologic stress testing was performed with intravenous administration of .4 mg of Lexiscan over a 10-15 seconds infusion. Stress symptoms included chest tightness, dyspnea, dizziness, headache. Normal blood pressure.  Exercise capacity not assessed. Stress EKG is non diagnostic for ischemia as it is a pharmacologic stress.  2. The overall quality of the study is fair. Review of the raw data in a rotational cine format reveals breast attenuation with imaging performed in sitting position.  Attenuation is most pronounced on rest images.  Left ventricular cavity is noted to be normal on the rest and stress studies. Gated SPECT images reveal normal myocardial thickening and wall motion.  The left ventricular ejection fraction was calculated or visually estimated to be 65%.  SPECT rest images demonstrate a large area of mild intensity perfusion defect in basal to apical inferior, inferoseptal and apical inferolateral myocardial walls.  Stress SPECT images demonstrate only a small area of mild intensity perfusion defect in apical inferior inferolateral myocardial walls. While perfusion defect on rest images likely represent breast attenuation, perfusion defect, perfusion defect on stress images could represent small area of mild ischemia.  3. Low risk study.   Lower Extremity Arterial Duplex 05/22/2020:  No hemodynamically significant stenoses are identified in the right or  left lower extremity arterial system.  This exam reveals normal perfusion of the right and left lower extremity  (Rt. ABI 1.14 and Lt. ABI 1.20).  Multiphasic normal waveforms noted at the ankles. Mild heterogeneous  plaque in bilateral lower extremity vessels.  PCV ECHOCARDIOGRAM COMPLETE 11/20/2020 Normal LV systolic function with visual EF 60-65%. Left ventricle cavity is normal in size. Moderate left ventricular hypertrophy. Normal global wall motion. Normal diastolic filling pattern, normal LAP. Mild  (Grade I) mitral regurgitation. Compared to study 04/12/2018 no significant change.   EKG   EKG 10/23/2020: Sinus rhythm with first-degree AV block at a rate of 73 bpm.  Normal axis.  Poor progression, cannot exclude anteroseptal infarct old.  Borderline criteria for LVH.  Compared to EKG 02/18/2021, no significant change.  EKG 02/19/2019: Sinus rhythm with first-degree block at rate of 86 bpm, normal axis.  No evidence of ischemia.  Voltage criteria for LVH.compared to 04/07/2018, frequent PACs not present.   Assessment:     ICD-10-CM   1. Coronary artery disease of native artery of native heart with stable angina pectoris (HCC)  I25.118     2. Essential hypertension  I10 hydrALAZINE (APRESOLINE) 50 MG tablet    3. Hypercholesterolemia  E78.00        Medications Discontinued During This Encounter  Medication Reason   glimepiride (AMARYL) 4 MG tablet    fluticasone (FLONASE) 50 MCG/ACT nasal spray    clobetasol cream (TEMOVATE) 0.05 %    celecoxib (CELEBREX) 100 MG capsule    benzonatate (TESSALON) 100 MG capsule    hydrALAZINE (APRESOLINE) 50 MG tablet      Meds ordered this encounter  Medications   hydrALAZINE (APRESOLINE) 50 MG tablet    Sig: Take 2 tablets (100 mg total) by mouth 3 (three) times daily.    Dispense:  270 tablet    Refill:  3     Recommendations:   Angie Hill  is a 80 y.o.  female  with hypertension, CAD s/p LAD PCI 2014, type 2 DM, OSA-not on CPAP, chronic palpitations much improved on metoprolol, event monitoring January 2020, that revealed no atrial fibrillation, low risk stress test also in March 2019, low normal LVEF by echocardiogram.   Patient presents for 66-month follow-up.  At last office visit started losartan 100 mg once daily due to uncontrolled hypertension, repeat BMP has unfortunately not been done.  At last visit has also ordered repeat lipid profile testing, which has not been done.  Patient agrees to have BMP and lipid profile testing  done as previously ordered.  She is also following closely with PCP of right ankle wound.  Encourage patient to also follow-up with PCP/sleep provider  regarding CPAP.  Discussed with patient the importance of treating underlying sleep apnea in order to improve hypertension control.  Blood pressure remains uncontrolled, will increase hydralazine from 50 mg to 100 mg p.o. 3 times daily.  Patient is now enrolled in remote patient monitoring of blood pressure, and will work closely with clinical pharmacist Manuela Schwartz for monitoring.  Follow-up in 3 months, sooner if needed, for CAD, hypertension, hyperlipidemia.   Alethia Berthold, PA-C 02/19/2021, 2:24 PM Office: [phone]

## 2021-02-19 ENCOUNTER — Encounter: Payer: Self-pay | Admitting: Student

## 2021-02-19 ENCOUNTER — Other Ambulatory Visit: Payer: Self-pay

## 2021-02-19 ENCOUNTER — Ambulatory Visit: Payer: Medicare Other | Admitting: Student

## 2021-02-19 VITALS — BP 157/82 | HR 67 | Temp 98.0°F | Resp 16 | Ht 67.0 in | Wt 277.0 lb

## 2021-02-19 DIAGNOSIS — E7800 Pure hypercholesterolemia, unspecified: Secondary | ICD-10-CM

## 2021-02-19 DIAGNOSIS — I10 Essential (primary) hypertension: Secondary | ICD-10-CM

## 2021-02-19 DIAGNOSIS — I25118 Atherosclerotic heart disease of native coronary artery with other forms of angina pectoris: Secondary | ICD-10-CM

## 2021-02-19 MED ORDER — HYDRALAZINE HCL 50 MG PO TABS
100.0000 mg | ORAL_TABLET | Freq: Three times a day (TID) | ORAL | 3 refills | Status: DC
Start: 2021-02-19 — End: 2021-08-04

## 2021-04-19 ENCOUNTER — Emergency Department (HOSPITAL_BASED_OUTPATIENT_CLINIC_OR_DEPARTMENT_OTHER): Payer: Medicare Other

## 2021-04-19 ENCOUNTER — Encounter (HOSPITAL_BASED_OUTPATIENT_CLINIC_OR_DEPARTMENT_OTHER): Payer: Self-pay | Admitting: *Deleted

## 2021-04-19 ENCOUNTER — Emergency Department (HOSPITAL_BASED_OUTPATIENT_CLINIC_OR_DEPARTMENT_OTHER)
Admission: EM | Admit: 2021-04-19 | Discharge: 2021-04-19 | Disposition: A | Discharge status: Home / Self Care | Payer: Medicare Other | Attending: Emergency Medicine | Admitting: Emergency Medicine

## 2021-04-19 ENCOUNTER — Other Ambulatory Visit: Payer: Self-pay

## 2021-04-19 DIAGNOSIS — J441 Chronic obstructive pulmonary disease with (acute) exacerbation: Secondary | ICD-10-CM | POA: Insufficient documentation

## 2021-04-19 DIAGNOSIS — R059 Cough, unspecified: Secondary | ICD-10-CM | POA: Diagnosis present

## 2021-04-19 DIAGNOSIS — Z79899 Other long term (current) drug therapy: Secondary | ICD-10-CM | POA: Insufficient documentation

## 2021-04-19 DIAGNOSIS — U071 COVID-19: Secondary | ICD-10-CM | POA: Diagnosis not present

## 2021-04-19 DIAGNOSIS — Z7982 Long term (current) use of aspirin: Secondary | ICD-10-CM | POA: Insufficient documentation

## 2021-04-19 DIAGNOSIS — Z7951 Long term (current) use of inhaled steroids: Secondary | ICD-10-CM | POA: Insufficient documentation

## 2021-04-19 DIAGNOSIS — Z87891 Personal history of nicotine dependence: Secondary | ICD-10-CM | POA: Diagnosis not present

## 2021-04-19 LAB — RESP PANEL BY RT-PCR (FLU A&B, COVID) ARPGX2
Influenza A by PCR: NEGATIVE
Influenza B by PCR: NEGATIVE
SARS Coronavirus 2 by RT PCR: POSITIVE — AB

## 2021-04-19 MED ORDER — BENZONATATE 100 MG PO CAPS: 100.0000 mg | ORAL_CAPSULE | Freq: Three times a day (TID) | ORAL | 0 refills | Status: DC

## 2021-04-19 MED ORDER — PREDNISONE 20 MG PO TABS: ORAL_TABLET | ORAL | 0 refills | Status: DC

## 2021-04-19 MED ORDER — MOLNUPIRAVIR 200 MG PO CAPS: 4.0000 | ORAL_CAPSULE | Freq: Two times a day (BID) | ORAL | 0 refills | Status: DC

## 2021-04-19 MED ORDER — PREDNISONE 10 MG PO TABS
60.0000 mg | ORAL_TABLET | Freq: Once | ORAL | Status: AC
  Administered 2021-04-19: 60 mg via ORAL
  Filled 2021-04-19: qty 1

## 2021-04-19 MED ORDER — MOLNUPIRAVIR 200 MG PO CAPS: 4.0000 | ORAL_CAPSULE | Freq: Two times a day (BID) | ORAL | 0 refills | Status: AC

## 2021-04-19 MED ORDER — IPRATROPIUM-ALBUTEROL 0.5-2.5 (3) MG/3ML IN SOLN
3.0000 mL | RESPIRATORY_TRACT | Status: AC
  Administered 2021-04-19 (×3): 3 mL via RESPIRATORY_TRACT
  Filled 2021-04-19 (×3): qty 3

## 2021-04-19 MED ORDER — ONDANSETRON 4 MG PO TBDP: ORAL_TABLET | ORAL | 0 refills | Status: DC

## 2021-04-19 MED ORDER — OXYCODONE-ACETAMINOPHEN 5-325 MG PO TABS
1.0000 | ORAL_TABLET | Freq: Once | ORAL | Status: AC
  Administered 2021-04-19: 1 via ORAL
  Filled 2021-04-19: qty 1

## 2021-04-19 NOTE — ED Notes (Signed)
Water provided for patients family member per request

## 2021-04-19 NOTE — Discharge Instructions (Signed)
Use your inhaler every 4 hours(6 puffs) while awake, return for sudden worsening shortness of breath, or if you need to use your inhaler more often.  ° °

## 2021-04-19 NOTE — ED Provider Notes (Signed)
[facility] EMERGENCY DEPARTMENT Provider Note   CSN: [id number] Arrival date & time: 04/19/21  1037     History  Chief Complaint  Patient presents with   Shortness of Breath    Angie Hill is a 81 y.o. female.  81 yo F with a chief complaints of cough and shortness of breath.  Going on for about a week now.  She has a grandson that sick with a similar illness.  She initially denies history of smoking or COPD but later in the history does endorse that she has been using her inhaler without much improvement.  She feels like her lungs are a bit tight.   Shortness of Breath Associated symptoms: cough and fever   Associated symptoms: no chest pain, no headaches, no vomiting and no wheezing       Home Medications Prior to Admission medications   Medication Sig Start Date End Date Taking? Authorizing Provider  benzonatate (TESSALON) 100 MG capsule Take 1 capsule (100 mg total) by mouth every 8 (eight) hours. 04/19/21  Yes Deno Etienne, DO  ondansetron (ZOFRAN-ODT) 4 MG disintegrating tablet 4mg  ODT q4 hours prn nausea/vomit 04/19/21  Yes Deno Etienne, DO  albuterol (PROVENTIL HFA;VENTOLIN HFA) 108 (90 Base) MCG/ACT inhaler Inhale 2 puffs into the lungs 2 (two) times daily as needed for wheezing or shortness of breath. 04/17/17   Horton, Barbette Hair, MD  allopurinol (ZYLOPRIM) 300 MG tablet Take 300 mg by mouth as needed.     [provider]  amLODipine (NORVASC) 10 MG tablet Take 1 tablet (10 mg total) by mouth daily at 12 noon. At noon 04/21/20 04/16/21  Adrian Prows, MD  aspirin EC 81 MG tablet Take 81 mg by mouth daily.    [provider]  atorvastatin (LIPITOR) 80 MG tablet Take 80 mg by mouth at bedtime.     [provider]  b complex vitamins tablet Take 1 tablet by mouth daily.    [provider]  budesonide-formoterol (SYMBICORT) 160-4.5 MCG/ACT inhaler Inhale 2 puffs into the lungs daily.    [provider]  cephALEXin (KEFLEX) 750 MG  capsule Take 750 mg by mouth 2 (two) times daily. 02/18/21   [provider]  cetirizine (ZYRTEC) 10 MG chewable tablet Chew 10 mg by mouth daily.    [provider]  Cyanocobalamin (VITAMIN B-12 PO) Take 1 tablet by mouth daily.     [provider]  furosemide (LASIX) 40 MG tablet Take 20 mg by mouth daily as needed for edema. For leg swelling 09/22/18   [provider]  gabapentin (NEURONTIN) 600 MG tablet Take 600 mg by mouth daily.    [provider]  hydrALAZINE (APRESOLINE) 50 MG tablet Take 2 tablets (100 mg total) by mouth 3 (three) times daily. 02/19/21   Cantwell, Celeste C, PA-C  isosorbide dinitrate (ISORDIL) 30 MG tablet Take 1 tablet (30 mg total) by mouth 3 (three) times daily. 10/23/20   Cantwell, Celeste C, PA-C  losartan (COZAAR) 100 MG tablet Take 1 tablet (100 mg total) by mouth daily. 11/20/20 11/15/21  Cantwell, Celeste C, PA-C  metoprolol (TOPROL-XL) 200 MG 24 hr tablet Take 1 tablet (200 mg total) by mouth daily. 10/23/20   Cantwell, Celeste C, PA-C  molnupiravir EUA (LAGEVRIO) 200 MG CAPS capsule Take 4 capsules (800 mg total) by mouth 2 (two) times daily for 5 days. 04/19/21 04/24/21  Deno Etienne, DO  montelukast (SINGULAIR) 10 MG tablet Take 10 mg by mouth daily.  06/28/19   [provider]  nitroGLYCERIN (NITROSTAT) 0.4 MG SL tablet Place 1 tablet (0.4 mg total) under the tongue every 5 (five) minutes as needed for chest pain. 07/27/18   Miquel Dunn, NP  pantoprazole (PROTONIX) 40 MG tablet Take 40 mg by mouth daily.     [provider]  potassium chloride (K-DUR) 10 MEQ tablet Take 10 mEq by mouth 2 (two) times daily.    [provider]  predniSONE (DELTASONE) 20 MG tablet 2 tabs po daily x 4 days 04/19/21   Deno Etienne, DO  PROAIR HFA [phone] Base) MCG/ACT inhaler as needed. 06/27/18   [provider]  promethazine (PHENERGAN) 25 MG tablet Unable to verify 10/01/19   [provider]   sertraline (ZOLOFT) 25 MG tablet Take 25 mg by mouth daily. 02/18/21   [provider]  traMADol (ULTRAM) 50 MG tablet Take 50 mg by mouth daily. 02/18/21   [provider]  traZODone (DESYREL) 50 MG tablet Take 50 mg by mouth at bedtime. 02/18/21   [provider]      Allergies    Ciprofloxacin and Erythromycin    Review of Systems   Review of Systems  Constitutional:  Positive for chills and fever.  HENT:  Negative for congestion and rhinorrhea.   Eyes:  Negative for redness and visual disturbance.  Respiratory:  Positive for cough and shortness of breath. Negative for wheezing.   Cardiovascular:  Negative for chest pain and palpitations.  Gastrointestinal:  Negative for nausea and vomiting.  Genitourinary:  Negative for dysuria and urgency.  Musculoskeletal:  Negative for arthralgias and myalgias.  Skin:  Negative for pallor and wound.  Neurological:  Negative for dizziness and headaches.   Physical Exam Updated Vital Signs BP (!) 167/85 (BP Location: Right Arm)    Pulse 83    Temp 98.2 F (36.8 C) (Oral)    Resp 17    Ht 5\' 7"  (1.702 m)    Wt 126 kg    SpO2 96%    BMI 43.51 kg/m  Physical Exam Vitals and nursing note reviewed.  Constitutional:      General: She is not in acute distress.    Appearance: She is well-developed. She is not diaphoretic.  HENT:     Head: Normocephalic and atraumatic.  Eyes:     Pupils: Pupils are equal, round, and reactive to light.  Cardiovascular:     Rate and Rhythm: Normal rate and regular rhythm.     Heart sounds: No murmur heard.   No friction rub. No gallop.  Pulmonary:     Effort: Pulmonary effort is normal.     Breath sounds: Wheezing present. No rales.     Comments: Diminished breath sounds in all fields with faint wheezes. Abdominal:     General: There is no distension.     Palpations: Abdomen is soft.     Tenderness: There is no abdominal tenderness.  Musculoskeletal:        General: No tenderness.      Cervical back: Normal range of motion and neck supple.  Skin:    General: Skin is warm and dry.  Neurological:     Mental Status: She is alert and oriented to person, place, and time.  Psychiatric:        Behavior: Behavior normal.    ED Results / Procedures / Treatments   Labs (all labs ordered are listed, but only abnormal results are displayed) Labs Reviewed  RESP  PANEL BY RT-PCR (FLU A&B, COVID) ARPGX2 - Abnormal; Notable for the following components:      Result Value   SARS Coronavirus 2 by RT PCR POSITIVE (*)    All other components within normal limits    EKG EKG Interpretation  Date/Time:  Sunday April 19 2021 11:30:12 EST Ventricular Rate:  83 PR Interval:  249 QRS Duration: 78 QT Interval:  396 QTC Calculation: 466 R Axis:   42 Text Interpretation: Sinus rhythm Prolonged PR interval Left ventricular hypertrophy No significant change since last tracing Confirmed by Deno Etienne [phone]) on 04/19/2021 12:23:58 PM  Radiology DG Chest Portable 1 View  Result Date: 04/19/2021 CLINICAL DATA:  Shortness of breath. EXAM: PORTABLE CHEST 1 VIEW COMPARISON:  October 25, 2018 FINDINGS: The heart size and mediastinal contours are within normal limits. Both lungs are clear. The visualized skeletal structures are unremarkable. IMPRESSION: No active disease. Electronically Signed   By: Dorise Bullion III M.D.   On: 04/19/2021 11:22    Procedures Procedures    Medications Ordered in ED Medications  oxyCODONE-acetaminophen (PERCOCET/ROXICET) 5-325 MG per tablet 1 tablet (has no administration in time range)  ipratropium-albuterol (DUONEB) 0.5-2.5 (3) MG/3ML nebulizer solution 3 mL (3 mLs Nebulization Given 04/19/21 1124)  predniSONE (DELTASONE) tablet 60 mg (60 mg Oral Given 04/19/21 1117)    ED Course/ Medical Decision Making/ A&P                           Medical Decision Making Amount and/or Complexity of Data Reviewed Radiology: ordered.  Risk Prescription drug  management.   Patient is a 81 y.o. female with a cc of difficulty breathing cough going on for about 4 days.  Diminished breath sounds in all fields on my exam with some faint wheezing.  We will give 3 duo nebs back-to-back and steroids.  Chest x-ray independently interpreted by me without focal infiltrate or pneumothorax..  The patient is feeling much better after steroids and DuoNeb's.  We will discharge home.  Her COVID test did come back positive.  We will start her on antiviral medication.  12:47 PM:  I have discussed the diagnosis/risks/treatment options with the patient and family.  Evaluation and diagnostic testing in the emergency department does not suggest an emergent condition requiring admission or immediate intervention beyond what has been performed at this time.  They will follow up with  PCP. We also discussed returning to the ED immediately if new or worsening sx occur. We discussed the sx which are most concerning (e.g., sudden worsening sob, confusion, fever, inability to tolerate by mouth) that necessitate immediate return. Medications administered to the patient during their visit and any new prescriptions provided to the patient are listed below.  Medications given during this visit Medications  oxyCODONE-acetaminophen (PERCOCET/ROXICET) 5-325 MG per tablet 1 tablet (has no administration in time range)  ipratropium-albuterol (DUONEB) 0.5-2.5 (3) MG/3ML nebulizer solution 3 mL (3 mLs Nebulization Given 04/19/21 1124)  predniSONE (DELTASONE) tablet 60 mg (60 mg Oral Given 04/19/21 1117)     The patient appears reasonably screen and/or stabilized for discharge and I doubt any other medical condition or other [facility] requiring further screening, evaluation, or treatment in the ED at this time prior to discharge.          Final Clinical Impression(s) / ED Diagnoses Final diagnoses:  COPD exacerbation ([place])  COVID-19 virus infection    Rx / DC Orders ED Discharge Orders  Ordered    predniSONE (DELTASONE) 20 MG tablet  Status:  Discontinued        04/19/21 1224    molnupiravir EUA (LAGEVRIO) 200 MG CAPS capsule  2 times daily,   Status:  Discontinued        04/19/21 1224    ondansetron (ZOFRAN-ODT) 4 MG disintegrating tablet        04/19/21 1225    benzonatate (TESSALON) 100 MG capsule  Every 8 hours        04/19/21 1225    molnupiravir EUA (LAGEVRIO) 200 MG CAPS capsule  2 times daily        04/19/21 1225    predniSONE (DELTASONE) 20 MG tablet        04/19/21 1225              [place], DO 04/19/21 1247

## 2021-04-19 NOTE — ED Triage Notes (Signed)
Presents with shortness of breath, onset a couple days ago, became worse last night, having prod cough of thick yellow brown sputum. Has dyspnea on exertion is noted

## 2021-05-20 NOTE — Progress Notes (Signed)
Primary Physician:  Cleopatra Cedar, NP   Patient ID: Angie Hill, female    DOB: 1940/03/24, 81 y.o.   MRN: [id number]  Subjective:    Chief Complaint  Patient presents with   Hypertension   HLD   Coronary Artery Disease    3 MONTH     HPI: Angie Hill  is a 81 y.o. female  with hypertension, CAD s/p LAD PCI 2014, type 2 DM, OSA-not on CPAP, now has been set up for sleep study, chronic palpitations much improved on metoprolol, event monitoring Jan 2020, that revealed no atrial fibrillation, low risk stress test also in March 2019, low normal LVEF by echocardiogram.   Patient presents for 45-month follow-up.  At last office visit increase hydralazine from 50 mg to 100 mg p.o. 3 times daily given uncontrolled hypertension and enrolled patient in remote patient monitoring.  Patient's blood pressure remains uncontrolled at today's office visit, however upon calling the pharmacy to do thorough medication reconciliation it appears patient has not been taking hydralazine as directed.  Notably patient uses 2 different pharmacies and we called both to verify.  Patient's primary concern today is intermittent episodes of dizziness over the last 2 days lasting 1 to 2 minutes each.  She still does not use CPAP.  Patient is also due for repeat BMP and lipid profile testing.  Past Medical History:  Diagnosis Date   Anemia    Angina pectoris (HCC)    Arthritis    Asthma    COPD (chronic obstructive pulmonary disease) (HCC)    Coronary artery disease    Depression    Diabetes mellitus without complication (HCC)    BORDERLINE   GERD (gastroesophageal reflux disease)    H/O hiatal hernia    Hypertension    Shortness of breath    Sleep apnea    Past Surgical History:  Procedure Laterality Date   ABDOMINAL HYSTERECTOMY     partial   CORONARY ANGIOPLASTY WITH STENT PLACEMENT  07/04/2012   mid LAD   LEFT AND RIGHT HEART CATHETERIZATION WITH CORONARY ANGIOGRAM N/A  07/04/2012   Procedure: LEFT AND RIGHT HEART CATHETERIZATION WITH CORONARY ANGIOGRAM;  Surgeon: Pamella Pert, MD;  Location: [facility] CATH LAB;  Service: Cardiovascular;  Laterality: N/A;   LEFT HEART CATHETERIZATION WITH CORONARY ANGIOGRAM N/A 10/17/2012   Procedure: LEFT HEART CATHETERIZATION WITH CORONARY ANGIOGRAM;  Surgeon: Pamella Pert, MD;  Location: [facility] CATH LAB;  Service: Cardiovascular;  Laterality: N/A;   PARTIAL COLECTOMY  1982   PERCUTANEOUS CORONARY STENT INTERVENTION (PCI-S)  07/04/2012   Procedure: PERCUTANEOUS CORONARY STENT INTERVENTION (PCI-S);  Surgeon: Pamella Pert, MD;  Location: [facility] CATH LAB;  Service: Cardiovascular;;   TUBAL LIGATION     Family History  Problem Relation Age of Onset   Hyperlipidemia Sister    Heart attack Sister    Diabetes Daughter    Stroke Father    Heart attack Father    Stroke Sister    Social History   Tobacco Use   Smoking status: Never   Smokeless tobacco: Never  Substance Use Topics   Alcohol use: No   Marital Status: Single  ROS   Review of Systems  Cardiovascular:  Positive for dyspnea on exertion (stable) and leg swelling (stable). Negative for chest pain, claudication, palpitations and syncope.  Respiratory:  Positive for snoring. Negative for shortness of breath.   Neurological:  Negative for dizziness.  All other systems reviewed and are negative.  Objective:  Blood pressure (!) 175/83, pulse 72, temperature 97.7 F (36.5 C), temperature source Temporal, resp. rate 17, height 5\' 7"  (1.702 m), weight 265 lb 12.8 oz (120.6 kg), SpO2 99 %.  Vitals with BMI 05/21/2021 04/19/2021 04/19/2021  Height 5\' 7"  - -  Weight 265 lbs 13 oz - -  BMI 41.62 - -  Systolic 175 161 [id number]  Diastolic 83 79 85  Pulse 72 - 83   Orthostatic VS for the past 72 hrs (Last 3 readings):  Orthostatic BP Patient Position BP Location Cuff Size Orthostatic Pulse  05/21/21 0919 (!) 176/94 Standing Left Arm Large 77  05/21/21 0918 171/80  Sitting Left Arm Large 66  05/21/21 0917 175/85 Supine Left Arm Large 63    Physical Exam Vitals reviewed.  Constitutional:      General: She is not in acute distress.    Appearance: She is well-developed.     Comments: Morbidly obese  Neck:     Thyroid: No thyromegaly.     Comments: Short neck and difficult to evaluate JVP Cardiovascular:     Rate and Rhythm: Normal rate and regular rhythm.     Pulses:          Carotid pulses are 2+ on the right side and 2+ on the left side.      Radial pulses are 2+ on the right side and 2+ on the left side.       Dorsalis pedis pulses are 1+ on the right side and 1+ on the left side.       Posterior tibial pulses are 1+ on the right side and 1+ on the left side.     Heart sounds: Normal heart sounds. No murmur heard.   No gallop.  Pulmonary:     Effort: Pulmonary effort is normal.     Breath sounds: Normal breath sounds.  Abdominal:     Comments: Obese. Pannus present  Musculoskeletal:     Cervical back: Neck supple.     Right lower leg: Edema (minimal) present.     Left lower leg: Edema (minimal) present.     Comments: Significant adipose tissue bilateral lower extremities.  Neurological:     Mental Status: She is alert.   Laboratory examination:   CMP Latest Ref Rng & Units 10/30/2020 04/25/2018 04/18/2018  Glucose 65 - 99 mg/dL 119(J114(H) 74 478(G106(H)  BUN 8 - 27 mg/dL 13 16 11   Creatinine 0.57 - 1.00 mg/dL 9.560.87 2.130.58 0.860.83  Sodium 134 - 144 mmol/L 143 141 147(H)  Potassium 3.5 - 5.2 mmol/L 3.8 3.4(L) 3.7  Chloride 96 - 106 mmol/L 103 104 102  CO2 20 - 29 mmol/L 25 28 28   Calcium 8.7 - 10.3 mg/dL 57.810.1 9.5 46.910.1  Total Protein 6.5 - 8.1 g/dL - 7.1 -  Total Bilirubin 0.3 - 1.2 mg/dL - 0.9 -  Alkaline Phos 38 - 126 U/L - 43 -  AST 15 - 41 U/L - 19 -  ALT 0 - 44 U/L - 23 -   CBC Latest Ref Rng & Units 10/30/2020 04/25/2018 09/17/2017  WBC 3.4 - 10.8 x10E3/uL 7.9 10.6(H) 10.5  Hemoglobin 11.1 - 15.9 g/dL 62.912.6 52.812.1 41.312.5  Hematocrit 34.0 - 46.6 %  39.3 39.4 38.4  Platelets 150 - 450 x10E3/uL 332 345 277   Lipid Panel     Component Value Date/Time   CHOL 243 (H) 10/30/2020 0839   TRIG 153 (H) 10/30/2020 0839   HDL 49 10/30/2020 0839   LDLCALC 166 (H)  10/30/2020 0839     HEMOGLOBIN A1C Lab Results  Component Value Date   HGBA1C 6.1 (H) 10/30/2020   TSH Recent Labs    10/30/20 0839  TSH 1.470    External labs: 07/26/2018: HDL 40, LDL 112, total cholesterol 135, triglycerides 77 A1c 5.8% TSH 1.127  Allergies   Allergies  Allergen Reactions   Doxycycline     Other reaction(s): Other (See Comments) unknown   Ciprofloxacin Itching and Rash   Erythromycin Itching and Rash    Medications Prior to Visit:   Outpatient Medications Prior to Visit  Medication Sig Dispense Refill   albuterol (PROVENTIL HFA;VENTOLIN HFA) 108 (90 Base) MCG/ACT inhaler Inhale 2 puffs into the lungs 2 (two) times daily as needed for wheezing or shortness of breath. 1 Inhaler 0   allopurinol (ZYLOPRIM) 300 MG tablet Take 300 mg by mouth as needed.      amLODipine (NORVASC) 10 MG tablet Take 1 tablet (10 mg total) by mouth daily at 12 noon. At noon 90 tablet 3   b complex vitamins tablet Take 1 tablet by mouth daily.     BREZTRI AEROSPHERE 160-9-4.8 MCG/ACT AERO Inhale 1 puff into the lungs in the morning and at bedtime.     budesonide-formoterol (SYMBICORT) 160-4.5 MCG/ACT inhaler Inhale 2 puffs into the lungs daily.     cephALEXin (KEFLEX) 750 MG capsule Take 750 mg by mouth 2 (two) times daily.     cetirizine (ZYRTEC) 10 MG chewable tablet Chew 10 mg by mouth daily.     Cyanocobalamin (VITAMIN B-12 PO) Take 1 tablet by mouth daily.      furosemide (LASIX) 40 MG tablet Take 20 mg by mouth daily as needed for edema. For leg swelling     gabapentin (NEURONTIN) 600 MG tablet Take 600 mg by mouth daily.     isosorbide dinitrate (ISORDIL) 30 MG tablet Take 1 tablet (30 mg total) by mouth 3 (three) times daily. 270 tablet 3    JANUVIA 25 MG tablet Take 25 mg by mouth daily.     LINZESS 72 MCG capsule Take 72 mcg by mouth every morning.     losartan (COZAAR) 100 MG tablet Take 1 tablet (100 mg total) by mouth daily. 90 tablet 3   metoprolol (TOPROL-XL) 200 MG 24 hr tablet Take 1 tablet (200 mg total) by mouth daily. 90 tablet 3   ondansetron (ZOFRAN-ODT) 4 MG disintegrating tablet 4mg  ODT q4 hours prn nausea/vomit 20 tablet 0   pantoprazole (PROTONIX) 40 MG tablet Take 40 mg by mouth daily.      Potassium Chloride ER 20 MEQ TBCR Take 1 tablet by mouth daily.     sertraline (ZOLOFT) 25 MG tablet Take 25 mg by mouth daily.     traMADol (ULTRAM) 50 MG tablet Take 50 mg by mouth as needed.     traZODone (DESYREL) 50 MG tablet Take 50 mg by mouth at bedtime.     potassium chloride (K-DUR) 10 MEQ tablet Take 10 mEq by mouth 2 (two) times daily.     aspirin EC 81 MG tablet Take 81 mg by mouth daily. (Patient not taking: Reported on 05/21/2021)     atorvastatin (LIPITOR) 80 MG tablet Take 80 mg by mouth at bedtime.  (Patient not taking: Reported on 05/21/2021)     benzonatate (TESSALON) 100 MG capsule Take 1 capsule (100 mg total) by mouth every 8 (eight) hours. 21 capsule 0   hydrALAZINE (APRESOLINE) 50 MG tablet Take 2 tablets (100 mg  total) by mouth 3 (three) times daily. (Patient not taking: Reported on 05/21/2021) 270 tablet 3   montelukast (SINGULAIR) 10 MG tablet Take 10 mg by mouth daily.     nitroGLYCERIN (NITROSTAT) 0.4 MG SL tablet Place 1 tablet (0.4 mg total) under the tongue every 5 (five) minutes as needed for chest pain. (Patient not taking: Reported on 05/21/2021) 25 tablet 3   predniSONE (DELTASONE) 20 MG tablet 2 tabs po daily x 4 days 8 tablet 0   PROAIR HFA 108 (90 Base) MCG/ACT inhaler as needed.     promethazine (PHENERGAN) 25 MG tablet Unable to verify     No facility-administered medications prior to visit.   Final Medications at End of Visit    Current Meds  Medication Sig    albuterol (PROVENTIL HFA;VENTOLIN HFA) 108 (90 Base) MCG/ACT inhaler Inhale 2 puffs into the lungs 2 (two) times daily as needed for wheezing or shortness of breath.   allopurinol (ZYLOPRIM) 300 MG tablet Take 300 mg by mouth as needed.    amLODipine (NORVASC) 10 MG tablet Take 1 tablet (10 mg total) by mouth daily at 12 noon. At noon   b complex vitamins tablet Take 1 tablet by mouth daily.   BREZTRI AEROSPHERE 160-9-4.8 MCG/ACT AERO Inhale 1 puff into the lungs in the morning and at bedtime.   budesonide-formoterol (SYMBICORT) 160-4.5 MCG/ACT inhaler Inhale 2 puffs into the lungs daily.   cephALEXin (KEFLEX) 750 MG capsule Take 750 mg by mouth 2 (two) times daily.   cetirizine (ZYRTEC) 10 MG chewable tablet Chew 10 mg by mouth daily.   Cyanocobalamin (VITAMIN B-12 PO) Take 1 tablet by mouth daily.    furosemide (LASIX) 40 MG tablet Take 20 mg by mouth daily as needed for edema. For leg swelling   gabapentin (NEURONTIN) 600 MG tablet Take 600 mg by mouth daily.   isosorbide dinitrate (ISORDIL) 30 MG tablet Take 1 tablet (30 mg total) by mouth 3 (three) times daily.   JANUVIA 25 MG tablet Take 25 mg by mouth daily.   LINZESS 72 MCG capsule Take 72 mcg by mouth every morning.   losartan (COZAAR) 100 MG tablet Take 1 tablet (100 mg total) by mouth daily.   metoprolol (TOPROL-XL) 200 MG 24 hr tablet Take 1 tablet (200 mg total) by mouth daily.   ondansetron (ZOFRAN-ODT) 4 MG disintegrating tablet 4mg  ODT q4 hours prn nausea/vomit   pantoprazole (PROTONIX) 40 MG tablet Take 40 mg by mouth daily.    Potassium Chloride ER 20 MEQ TBCR Take 1 tablet by mouth daily.   sertraline (ZOLOFT) 25 MG tablet Take 25 mg by mouth daily.   traMADol (ULTRAM) 50 MG tablet Take 50 mg by mouth as needed.   traZODone (DESYREL) 50 MG tablet Take 50 mg by mouth at bedtime.   [DISCONTINUED] potassium chloride (K-DUR) 10 MEQ tablet Take 10 mEq by mouth 2 (two) times daily.   Radiology:   No  results found.  Cardiac Studies:   Coronary angiogram 2014: Patent pLAD stent (4.0X22 mm Integrity BMS) Mid LAD mod stenosis. FFR 0.86 (Nonsignificant) LCx: Ostial 20-30% stenosis. Ostial AV grove 30% stenosis RCA: Mid tandem 20-30% stenosis and 40-50% stenoses.  48 hour holter monitor 04/07/2018:  Normal sinus rhythm. Min HR 60 and Max HR 103. Occasional PVC (4.3% burden) and rare PAC (<1% burden). No A fib or SVT noted. No heart block.    Lexiscan myoview stress test 06/06/2017:  1. Pharmacologic stress testing was performed with intravenous administration  of .4 mg of Lexiscan over a 10-15 seconds infusion. Stress symptoms included chest tightness, dyspnea, dizziness, headache. Normal blood pressure.  Exercise capacity not assessed. Stress EKG is non diagnostic for ischemia as it is a pharmacologic stress.  2. The overall quality of the study is fair. Review of the raw data in a rotational cine format reveals breast attenuation with imaging performed in sitting position. Attenuation is most pronounced on rest images.  Left ventricular cavity is noted to be normal on the rest and stress studies. Gated SPECT images reveal normal myocardial thickening and wall motion.  The left ventricular ejection fraction was calculated or visually estimated to be 65%.  SPECT rest images demonstrate a large area of mild intensity perfusion defect in basal to apical inferior, inferoseptal and apical inferolateral myocardial walls.  Stress SPECT images demonstrate only a small area of mild intensity perfusion defect in apical inferior inferolateral myocardial walls. While perfusion defect on rest images likely represent breast attenuation, perfusion defect, perfusion defect on stress images could represent small area of mild ischemia.  3. Low risk study.   Lower Extremity Arterial Duplex 05/22/2020:  No hemodynamically significant stenoses are identified in the right or  left lower extremity arterial system.   This exam reveals normal perfusion of the right and left lower extremity  (Rt. ABI 1.14 and Lt. ABI 1.20).  Multiphasic normal waveforms noted at the ankles. Mild heterogeneous  plaque in bilateral lower extremity vessels.  PCV ECHOCARDIOGRAM COMPLETE 11/20/2020 Normal LV systolic function with visual EF 60-65%. Left ventricle cavity is normal in size. Moderate left ventricular hypertrophy. Normal global wall motion. Normal diastolic filling pattern, normal LAP. Mild (Grade I) mitral regurgitation. Compared to study 04/12/2018 no significant change.   EKG  05/21/2021: Sinus rhythm with first-degree AV block at a rate of 66 bpm.  Normal axis.  LVH.  Compared EKG 10/23/2020, no PRWP.  EKG 02/19/2019: Sinus rhythm with first-degree block at rate of 86 bpm, normal axis.  No evidence of ischemia.  Voltage criteria for LVH.compared to 04/07/2018, frequent PACs not present.   Assessment:     ICD-10-CM   1. Coronary artery disease of native artery of native heart with stable angina pectoris (HCC)  I25.118 EKG 12-Lead    2. Essential hypertension  I10     3. Hypercholesterolemia  E78.00     4. OSA (obstructive sleep apnea)  G47.33     5. Chronic venous insufficiency  I87.2        Medications Discontinued During This Encounter  Medication Reason   potassium chloride (K-DUR) 10 MEQ tablet Change in therapy     No orders of the defined types were placed in this encounter.  Recommendations:   Angie Hill  is a 81 y.o.  female  with hypertension, CAD s/p LAD PCI 2014, type 2 DM, OSA-not on CPAP, chronic palpitations much improved on metoprolol, event monitoring January 2020, that revealed no atrial fibrillation, low risk stress test also in March 2019, low normal LVEF by echocardiogram.   Patient presents for 36-month follow-up.  At last office visit increase hydralazine from 50 mg to 100 mg p.o. 3 times daily given uncontrolled hypertension and enrolled patient in remote patient  monitoring.  Patient's blood pressure remains uncontrolled likely as she has not been taking hydralazine according to pharmacy review of records.  Suspect patient's dizziness may be related to uncontrolled blood pressure as well as dehydration.  Encouraged patient to stay well-hydrated and to resume hydralazine as previously  prescribed.  Patient has been diagnosed with sleep apnea, however is not using CPAP.  She has never followed up with Dr. Merton Border.  Patient request new referral to sleep medicine provider in [facility], will send referral today, particularly as untreated sleep apnea is likely contributing to difficult to control hypertension.  We will obtain repeat BMP and lipid profile testing.  Patient agrees to bring all of her medications with her to the next office visit.  Follow-up in 3 weeks, sooner if needed, for hypertension.   This was a 50-minute encounter with face-to-face counseling, medical records review, coordination of care, explanation of complex medical issues, complex medical decision making, obtaining pharmacy records.     Rayford Halsted, PA-C 05/21/2021, 10:57 AM Office: [phone]

## 2021-05-21 ENCOUNTER — Other Ambulatory Visit: Payer: Self-pay

## 2021-05-21 ENCOUNTER — Encounter: Payer: Self-pay | Admitting: Student

## 2021-05-21 ENCOUNTER — Ambulatory Visit: Payer: Medicare Other | Admitting: Student

## 2021-05-21 VITALS — BP 175/83 | HR 72 | Temp 97.7°F | Resp 17 | Ht 67.0 in | Wt 265.8 lb

## 2021-05-21 DIAGNOSIS — E7800 Pure hypercholesterolemia, unspecified: Secondary | ICD-10-CM

## 2021-05-21 DIAGNOSIS — I872 Venous insufficiency (chronic) (peripheral): Secondary | ICD-10-CM

## 2021-05-21 DIAGNOSIS — I25118 Atherosclerotic heart disease of native coronary artery with other forms of angina pectoris: Secondary | ICD-10-CM

## 2021-05-21 DIAGNOSIS — G4733 Obstructive sleep apnea (adult) (pediatric): Secondary | ICD-10-CM

## 2021-05-21 DIAGNOSIS — I10 Essential (primary) hypertension: Secondary | ICD-10-CM

## 2021-06-11 ENCOUNTER — Encounter: Payer: Medicare Other | Admitting: Cardiology

## 2021-06-11 ENCOUNTER — Ambulatory Visit: Payer: Medicare Other | Admitting: Student

## 2021-06-11 ENCOUNTER — Encounter: Payer: Self-pay | Admitting: Student

## 2021-06-11 VITALS — BP 150/75 | HR 65 | Temp 97.5°F | Resp 16 | Ht 67.0 in | Wt 267.0 lb

## 2021-06-11 DIAGNOSIS — I10 Essential (primary) hypertension: Secondary | ICD-10-CM

## 2021-06-11 DIAGNOSIS — I25118 Atherosclerotic heart disease of native coronary artery with other forms of angina pectoris: Secondary | ICD-10-CM

## 2021-06-11 DIAGNOSIS — G4733 Obstructive sleep apnea (adult) (pediatric): Secondary | ICD-10-CM

## 2021-06-11 DIAGNOSIS — E7800 Pure hypercholesterolemia, unspecified: Secondary | ICD-10-CM

## 2021-06-11 NOTE — Progress Notes (Signed)
? ?Primary Physician:  Cleopatra Cedar, NP ? ? ?Patient ID: Angie Hill, female    DOB: September 30, 1940, 81 y.o.   MRN: [id number] ? ?Subjective:  ? ? ?Chief Complaint  ?Patient presents with  ? Coronary Artery Disease  ? Hypertension  ? Follow-up  ?  3 weeks  ? ? ? ?HPI: Angie Hill  is a 81 y.o. female  with hypertension, CAD s/p LAD PCI 2014, type 2 DM, OSA-not on CPAP, now has been set up for sleep study, chronic palpitations much improved on metoprolol, event monitoring Jan 2020, that revealed no atrial fibrillation, low risk stress test also in March 2019, low normal LVEF by echocardiogram.  ? ?Patient presents for 3-week follow-up.  At last office visit she had not been taking hydralazine as prescribed, therefore resumed it.  Also last office visit patient had not followed up with Dr. Merton Border and was not using CPAP. Last visit ordered lipid panel and BMP, which has not been done as ordered.  Patient has been more compliant with medications since last office visit.  Blood pressure monitoring per RPM averaging 150/83 mmHg with heart rate of 67 bpm.  Patient has had 2 brief episodes of dizziness, but overall is significantly improved. ? ?Patient is scheduled to see PCP for regular in 1 week and seen sleep medicine provider in May. ? ?Past Medical History:  ?Diagnosis Date  ? Anemia   ? Angina pectoris (HCC)   ? Arthritis   ? Asthma   ? COPD (chronic obstructive pulmonary disease) (HCC)   ? Coronary artery disease   ? Depression   ? Diabetes mellitus without complication (HCC)   ? BORDERLINE  ? GERD (gastroesophageal reflux disease)   ? H/O hiatal hernia   ? Hypertension   ? Shortness of breath   ? Sleep apnea   ? ?Past Surgical History:  ?Procedure Laterality Date  ? ABDOMINAL HYSTERECTOMY    ? partial  ? CORONARY ANGIOPLASTY WITH STENT PLACEMENT  07/04/2012  ? mid LAD  ? LEFT AND RIGHT HEART CATHETERIZATION WITH CORONARY ANGIOGRAM N/A 07/04/2012  ? Procedure: LEFT AND RIGHT HEART CATHETERIZATION WITH CORONARY  ANGIOGRAM;  Surgeon: Pamella Pert, MD;  Location: [facility] CATH LAB;  Service: Cardiovascular;  Laterality: N/A;  ? LEFT HEART CATHETERIZATION WITH CORONARY ANGIOGRAM N/A 10/17/2012  ? Procedure: LEFT HEART CATHETERIZATION WITH CORONARY ANGIOGRAM;  Surgeon: Pamella Pert, MD;  Location: [facility] CATH LAB;  Service: Cardiovascular;  Laterality: N/A;  ? PARTIAL COLECTOMY  1982  ? PERCUTANEOUS CORONARY STENT INTERVENTION (PCI-S)  07/04/2012  ? Procedure: PERCUTANEOUS CORONARY STENT INTERVENTION (PCI-S);  Surgeon: Pamella Pert, MD;  Location: [facility] CATH LAB;  Service: Cardiovascular;;  ? TUBAL LIGATION    ? ?Family History  ?Problem Relation Age of Onset  ? Hyperlipidemia Sister   ? Heart attack Sister   ? Diabetes Daughter   ? Stroke Father   ? Heart attack Father   ? Stroke Sister   ? ?Social History  ? ?Tobacco Use  ? Smoking status: Never  ? Smokeless tobacco: Never  ?Substance Use Topics  ? Alcohol use: No  ? ?Marital Status: Single ? ?ROS  ? ?Review of Systems  ?Cardiovascular:  Positive for dyspnea on exertion (stable) and leg swelling (stable). Negative for chest pain, claudication, palpitations and syncope.  ?Respiratory:  Positive for snoring. Negative for shortness of breath.   ?Neurological:  Negative for dizziness.  ?All other systems reviewed and are negative. ? ?Objective:  ?Blood pressure Marland Kitchen)  150/75, pulse 65, temperature (!) 97.5 ?F (36.4 ?C), resp. rate 16, height 5\' 7"  (1.702 m), weight 267 lb (121.1 kg), SpO2 96 %. ? ? ?  06/11/2021  ?  8:44 AM 05/21/2021  ?  9:00 AM 04/19/2021  ?  1:05 PM  ?Vitals with BMI  ?Height 5\' 7"  5\' 7"    ?Weight 267 lbs 265 lbs 13 oz   ?BMI 41.81 41.62   ?Systolic 150 175 06/17/2021  ?Diastolic 75 83 79  ?Pulse 65 72   ? Orthostatic VS for the past 72 hrs (Last 3 readings): ? Patient Position BP Location Cuff Size  ?06/11/21 0844 Sitting Left Arm Large  ? ? ?Physical Exam ?Vitals reviewed.  ?Constitutional:   ?   Appearance: She is well-developed.  ?   Comments: Morbidly obese  ?Neck:  ?    Thyroid: No thyromegaly.  ?   Comments: Short neck and difficult to evaluate JVP ?Cardiovascular:  ?   Rate and Rhythm: Normal rate and regular rhythm.  ?   Pulses:     ?     Carotid pulses are 2+ on the right side and 2+ on the left side. ?     Radial pulses are 2+ on the right side and 2+ on the left side.  ?     Dorsalis pedis pulses are 1+ on the right side and 1+ on the left side.  ?     Posterior tibial pulses are 1+ on the right side and 1+ on the left side.  ?   Heart sounds: Normal heart sounds. No murmur heard. ?  No gallop.  ?Pulmonary:  ?   Effort: Pulmonary effort is normal.  ?   Breath sounds: Normal breath sounds.  ?Abdominal:  ?   Comments: Obese. Pannus present  ?Musculoskeletal:  ?   Right lower leg: Edema (minimal) present.  ?   Left lower leg: Edema (minimal) present.  ?   Comments: Significant adipose tissue bilateral lower extremities.  ? ?Laboratory examination:  ? ? ?  Latest Ref Rng & Units 10/30/2020  ?  8:39 AM 04/25/2018  ?  4:14 PM 04/18/2018  ?  8:12 AM  ?CMP  ?Glucose 65 - 99 mg/dL 11/01/2020   74   06/24/2018    ?BUN 8 - 27 mg/dL 13   16   11     ?Creatinine 0.57 - 1.00 mg/dL 06/17/2018   341   937    ?Sodium 134 - 144 mmol/L 143   141   147    ?Potassium 3.5 - 5.2 mmol/L 3.8   3.4   3.7    ?Chloride 96 - 106 mmol/L 103   104   102    ?CO2 20 - 29 mmol/L 25   28   28     ?Calcium 8.7 - 10.3 mg/dL   9.5   9.02    ?Total Protein 6.5 - 8.1 g/dL  7.1     ?Total Bilirubin 0.3 - 1.2 mg/dL  0.9     ?Alkaline Phos 38 - 126 U/L  43     ?AST 15 - 41 U/L  19     ?ALT 0 - 44 U/L  23     ? ? ?  Latest Ref Rng & Units 10/30/2020  ?  8:39 AM 04/25/2018  ?  4:14 PM 09/17/2017  ?  3:04 PM  ?CBC  ?WBC 3.4 - 10.8 x10E3/uL 7.9   10.6   10.5    ?Hemoglobin  11.1 - 15.9 g/dL 09.812.6   11.912.1   14.712.5    ?Hematocrit 34.0 - 46.6 % 39.3   39.4   38.4    ?Platelets 150 - 450 x10E3/uL 332   345   277    ? ?Lipid Panel  ?   ?Component Value Date/Time  ? CHOL 243 (H) 10/30/2020 0839  ? TRIG 153 (H) 10/30/2020 0839  ? HDL 49 10/30/2020 0839  ?  LDLCALC 166 (H) 10/30/2020 [id number]  ?  ? ?HEMOGLOBIN A1C ?Lab Results  ?Component Value Date  ? HGBA1C 6.1 (H) 10/30/2020  ? ?TSH ?Recent Labs  ?  10/30/20 ?[id number]  ?TSH 1.470  ? ? ?External labs: ?07/26/2018: ?HDL 40, LDL 112, total cholesterol 135, triglycerides 77 ?A1c 5.8% ?TSH 1.127 ? ?Allergies  ? ?Allergies  ?Allergen Reactions  ? Doxycycline   ?  Other reaction(s): Other (See Comments) ?unknown  ? Ciprofloxacin Itching and Rash  ? Erythromycin Itching and Rash  ?  ?Medications Prior to Visit:  ? ?Outpatient Medications Prior to Visit  ?Medication Sig Dispense Refill  ? albuterol (PROVENTIL HFA;VENTOLIN HFA) 108 (90 Base) MCG/ACT inhaler Inhale 2 puffs into the lungs 2 (two) times daily as needed for wheezing or shortness of breath. 1 Inhaler 0  ? allopurinol (ZYLOPRIM) 300 MG tablet Take 300 mg by mouth as needed.     ? amLODipine (NORVASC) 10 MG tablet Take 1 tablet (10 mg total) by mouth daily at 12 noon. At noon 90 tablet 3  ? aspirin EC 81 MG tablet Take 81 mg by mouth daily.    ? atorvastatin (LIPITOR) 80 MG tablet Take 80 mg by mouth at bedtime.    ? b complex vitamins tablet Take 1 tablet by mouth daily.    ? BREZTRI AEROSPHERE 160-9-4.8 MCG/ACT AERO Inhale 1 puff into the lungs in the morning and at bedtime.    ? budesonide-formoterol (SYMBICORT) 160-4.5 MCG/ACT inhaler Inhale 2 puffs into the lungs daily.    ? cetirizine (ZYRTEC) 10 MG chewable tablet Chew 10 mg by mouth daily.    ? furosemide (LASIX) 40 MG tablet Take 20 mg by mouth daily as needed for edema. For leg swelling    ? gabapentin (NEURONTIN) 600 MG tablet Take 600 mg by mouth daily.    ? hydrALAZINE (APRESOLINE) 50 MG tablet Take 2 tablets (100 mg total) by mouth 3 (three) times daily. 270 tablet 3  ? isosorbide dinitrate (ISORDIL) 30 MG tablet Take 1 tablet (30 mg total) by mouth 3 (three) times daily. 270 tablet 3  ? LINZESS 72 MCG capsule Take 72 mcg by mouth every morning.    ? losartan (COZAAR) 100 MG tablet Take 1 tablet (100 mg total)  by mouth daily. 90 tablet 3  ? metoprolol (TOPROL-XL) 200 MG 24 hr tablet Take 1 tablet (200 mg total) by mouth daily. 90 tablet 3  ? montelukast (SINGULAIR) 10 MG tablet Take 10 mg by mouth daily.    ? ni

## 2021-06-24 ENCOUNTER — Emergency Department (HOSPITAL_BASED_OUTPATIENT_CLINIC_OR_DEPARTMENT_OTHER): Payer: Medicare Other

## 2021-06-24 ENCOUNTER — Emergency Department (HOSPITAL_BASED_OUTPATIENT_CLINIC_OR_DEPARTMENT_OTHER)
Admission: EM | Admit: 2021-06-24 | Discharge: 2021-06-24 | Disposition: A | Discharge status: Home / Self Care | Payer: Medicare Other | Attending: Emergency Medicine | Admitting: Emergency Medicine

## 2021-06-24 ENCOUNTER — Encounter (HOSPITAL_BASED_OUTPATIENT_CLINIC_OR_DEPARTMENT_OTHER): Payer: Self-pay

## 2021-06-24 ENCOUNTER — Other Ambulatory Visit: Payer: Self-pay

## 2021-06-24 DIAGNOSIS — R1032 Left lower quadrant pain: Secondary | ICD-10-CM | POA: Diagnosis not present

## 2021-06-24 DIAGNOSIS — Z20822 Contact with and (suspected) exposure to covid-19: Secondary | ICD-10-CM | POA: Diagnosis not present

## 2021-06-24 DIAGNOSIS — I10 Essential (primary) hypertension: Secondary | ICD-10-CM | POA: Insufficient documentation

## 2021-06-24 DIAGNOSIS — R519 Headache, unspecified: Secondary | ICD-10-CM | POA: Insufficient documentation

## 2021-06-24 DIAGNOSIS — Z79899 Other long term (current) drug therapy: Secondary | ICD-10-CM | POA: Insufficient documentation

## 2021-06-24 DIAGNOSIS — Z7982 Long term (current) use of aspirin: Secondary | ICD-10-CM | POA: Insufficient documentation

## 2021-06-24 DIAGNOSIS — S8991XA Unspecified injury of right lower leg, initial encounter: Secondary | ICD-10-CM | POA: Diagnosis present

## 2021-06-24 DIAGNOSIS — R0981 Nasal congestion: Secondary | ICD-10-CM | POA: Diagnosis not present

## 2021-06-24 DIAGNOSIS — S80821A Blister (nonthermal), right lower leg, initial encounter: Secondary | ICD-10-CM | POA: Diagnosis not present

## 2021-06-24 DIAGNOSIS — X58XXXA Exposure to other specified factors, initial encounter: Secondary | ICD-10-CM | POA: Diagnosis not present

## 2021-06-24 DIAGNOSIS — Z7901 Long term (current) use of anticoagulants: Secondary | ICD-10-CM | POA: Insufficient documentation

## 2021-06-24 DIAGNOSIS — I2510 Atherosclerotic heart disease of native coronary artery without angina pectoris: Secondary | ICD-10-CM | POA: Diagnosis not present

## 2021-06-24 DIAGNOSIS — S80822A Blister (nonthermal), left lower leg, initial encounter: Secondary | ICD-10-CM | POA: Insufficient documentation

## 2021-06-24 DIAGNOSIS — J449 Chronic obstructive pulmonary disease, unspecified: Secondary | ICD-10-CM | POA: Insufficient documentation

## 2021-06-24 LAB — COMPREHENSIVE METABOLIC PANEL
ALT: 15 U/L (ref 0–44)
AST: 17 U/L (ref 15–41)
Albumin: 4 g/dL (ref 3.5–5.0)
Alkaline Phosphatase: 38 U/L (ref 38–126)
Anion gap: 10 (ref 5–15)
BUN: 17 mg/dL (ref 8–23)
CO2: 29 mmol/L (ref 22–32)
Calcium: 9.4 mg/dL (ref 8.9–10.3)
Chloride: 102 mmol/L (ref 98–111)
Creatinine, Ser: 0.87 mg/dL (ref 0.44–1.00)
GFR, Estimated: >60 mL/min (ref >60)
Glucose, Bld: 104 mg/dL — ABNORMAL HIGH (ref 70–99)
Potassium: 3.3 mmol/L — ABNORMAL LOW (ref 3.5–5.1)
Sodium: 141 mmol/L (ref 135–145)
Total Bilirubin: 0.6 mg/dL (ref 0.3–1.2)
Total Protein: 7.4 g/dL (ref 6.5–8.1)

## 2021-06-24 LAB — CBC
HCT: 38.2 % (ref 36.0–46.0)
Hemoglobin: 12.4 g/dL (ref 12.0–15.0)
MCH: 26.8 pg (ref 26.0–34.0)
MCHC: 32.5 g/dL (ref 30.0–36.0)
MCV: 82.5 fL (ref 80.0–100.0)
Platelets: 361 10*3/uL (ref 150–400)
RBC: 4.63 MIL/uL (ref 3.87–5.11)
RDW: 13.8 % (ref 11.5–15.5)
WBC: 8 10*3/uL (ref 4.0–10.5)
nRBC: 0 % (ref 0.0–0.2)

## 2021-06-24 LAB — RESP PANEL BY RT-PCR (FLU A&B, COVID) ARPGX2
Influenza A by PCR: NEGATIVE
Influenza B by PCR: NEGATIVE
SARS Coronavirus 2 by RT PCR: NEGATIVE

## 2021-06-24 LAB — URINALYSIS, ROUTINE W REFLEX MICROSCOPIC
Bilirubin Urine: NEGATIVE
Glucose, UA: NEGATIVE mg/dL
Hgb urine dipstick: NEGATIVE
Ketones, ur: NEGATIVE mg/dL
Leukocytes,Ua: NEGATIVE
Nitrite: NEGATIVE
Protein, ur: NEGATIVE mg/dL
Specific Gravity, Urine: >=1.03 (ref 1.005–1.030)
pH: 6 (ref 5.0–8.0)

## 2021-06-24 LAB — LIPASE, BLOOD: Lipase: 34 U/L (ref 11–51)

## 2021-06-24 MED ORDER — MORPHINE SULFATE (PF) 4 MG/ML IV SOLN
4.0000 mg | Freq: Once | INTRAVENOUS | Status: AC
  Administered 2021-06-24: 4 mg via INTRAVENOUS
  Filled 2021-06-24: qty 1

## 2021-06-24 MED ORDER — IOHEXOL 300 MG/ML  SOLN
125.0000 mL | Freq: Once | INTRAMUSCULAR | Status: AC | PRN
  Administered 2021-06-24: 125 mL via INTRAVENOUS

## 2021-06-24 MED ORDER — ONDANSETRON HCL 4 MG PO TABS: 4.0000 mg | ORAL_TABLET | Freq: Four times a day (QID) | ORAL | 0 refills | Status: DC

## 2021-06-24 MED ORDER — SODIUM CHLORIDE 0.9 % IV BOLUS
1000.0000 mL | Freq: Once | INTRAVENOUS | Status: AC
  Administered 2021-06-24: 1000 mL via INTRAVENOUS

## 2021-06-24 MED ORDER — ONDANSETRON HCL 4 MG/2ML IJ SOLN
4.0000 mg | Freq: Once | INTRAMUSCULAR | Status: AC
  Administered 2021-06-24: 4 mg via INTRAVENOUS
  Filled 2021-06-24: qty 2

## 2021-06-24 MED ORDER — DICYCLOMINE HCL 20 MG PO TABS: 20.0000 mg | ORAL_TABLET | Freq: Two times a day (BID) | ORAL | 0 refills | Status: DC | PRN

## 2021-06-24 NOTE — Discharge Instructions (Addendum)
Take Zofran for nausea ? ?Take Bentyl for cramps ? ?Your CT scan did not show any bowel obstruction and your work-up in the ED essentially is normal ? ?See your doctor for follow-up ? ?Return to ER if you have severe abdominal pain, vomiting, headache ?

## 2021-06-24 NOTE — ED Triage Notes (Signed)
C/o dry cough, headache, postnasal drip x 3 days. Also has lower left abdominal pain radiating to left flank & nausea x 3 weeks that worsened this morning. Adds that she also has blisters on top of feet and legs. ?

## 2021-06-24 NOTE — ED Notes (Signed)
Pt in CT.

## 2021-06-24 NOTE — ED Provider Notes (Signed)
?[facility] EMERGENCY DEPARTMENT ?Provider Note ? ? ?CSN: [id number] ?Arrival date & time: 06/24/21  1429 ? ?  ? ?History ? ?Chief Complaint  ?Patient presents with  ? Abdominal Pain  ? ? ?Rogene HoustonVersie M Hudspeth is a 81 y.o. female history of hypertension, CAD, COPD, here presenting with abdominal pain and nasal congestion and also blisters on the leg.  Patient states that she has been having lower abdominal pain for the last several weeks.  Patient states that it got worse yesterday.  Associated with some vomiting as well.  Patient does have a history of hysterectomy in the past but no history of SBO.  For the last several weeks she also has some nasal congestion and headaches.  Finally she noticed blisters on bilateral legs.  She states that she has been having them for years and have not been checked out with her doctor.  She denies any contacts with poison ivy or any known allergen.  ? ?The history is provided by the patient.  ? ?  ? ?Home Medications ?Prior to Admission medications   ?Medication Sig Start Date End Date Taking? Authorizing Provider  ?albuterol (PROVENTIL HFA;VENTOLIN HFA) 108 (90 Base) MCG/ACT inhaler Inhale 2 puffs into the lungs 2 (two) times daily as needed for wheezing or shortness of breath. 04/17/17   Horton, Mayer Maskerourtney F, MD  ?allopurinol (ZYLOPRIM) 300 MG tablet Take 300 mg by mouth as needed.     [provider]  ?amLODipine (NORVASC) 10 MG tablet Take 1 tablet (10 mg total) by mouth daily at 12 noon. At noon 04/21/20 06/11/21  Yates DecampGanji, Jay, MD  ?aspirin EC 81 MG tablet Take 81 mg by mouth daily.    [provider]  ?atorvastatin (LIPITOR) 80 MG tablet Take 80 mg by mouth at bedtime.    [provider]  ?b complex vitamins tablet Take 1 tablet by mouth daily.    [provider]  ?BREZTRI AEROSPHERE 160-9-4.8 MCG/ACT AERO Inhale 1 puff into the lungs in the morning and at bedtime. 05/04/21   [provider]  ?budesonide-formoterol (SYMBICORT) 160-4.5  MCG/ACT inhaler Inhale 2 puffs into the lungs daily.    [provider]  ?cetirizine (ZYRTEC) 10 MG chewable tablet Chew 10 mg by mouth daily.    [provider]  ?furosemide (LASIX) 40 MG tablet Take 20 mg by mouth daily as needed for edema. For leg swelling 09/22/18   [provider]  ?gabapentin (NEURONTIN) 600 MG tablet Take 600 mg by mouth daily.    [provider]  ?hydrALAZINE (APRESOLINE) 50 MG tablet Take 2 tablets (100 mg total) by mouth 3 (three) times daily. 02/19/21   Cantwell, Celeste C, PA-C  ?isosorbide dinitrate (ISORDIL) 30 MG tablet Take 1 tablet (30 mg total) by mouth 3 (three) times daily. 10/23/20   Cantwell, Celeste C, PA-C  ?JANUVIA 25 MG tablet Take 25 mg by mouth daily. ?Patient not taking: Reported on 06/11/2021 03/31/21   [provider]  ?Karlene EinsteinLINZESS 72 MCG capsule Take 72 mcg by mouth every morning. 03/24/21   [provider]  ?losartan (COZAAR) 100 MG tablet Take 1 tablet (100 mg total) by mouth daily. 11/20/20 11/15/21  Cantwell, Celeste C, PA-C  ?metoprolol (TOPROL-XL) 200 MG 24 hr tablet Take 1 tablet (200 mg total) by mouth daily. 10/23/20   Cantwell, Celeste C, PA-C  ?montelukast (SINGULAIR) 10 MG tablet Take 10 mg by mouth daily. 06/28/19   [provider]  ?nitroGLYCERIN (NITROSTAT) 0.4 MG SL tablet  Place 1 tablet (0.4 mg total) under the tongue every 5 (five) minutes as needed for chest pain. 07/27/18   Toniann Fail, NP  ?pantoprazole (PROTONIX) 40 MG tablet Take 40 mg by mouth daily.     [provider]  ?PROAIR HFA 108 [phone] Base) MCG/ACT inhaler as needed. 06/27/18   [provider]  ?promethazine (PHENERGAN) 25 MG tablet Unable to verify 10/01/19   [provider]  ?sertraline (ZOLOFT) 25 MG tablet Take 25 mg by mouth daily. 02/18/21   [provider]  ?traMADol (ULTRAM) 50 MG tablet Take 50 mg by mouth as needed. 02/18/21   [provider]  ?traZODone (DESYREL) 50 MG tablet Take  50 mg by mouth at bedtime. 02/18/21   [provider]  ?   ? ?Allergies    ?Doxycycline, Ciprofloxacin, and Erythromycin   ? ?Review of Systems   ?Review of Systems  ?Gastrointestinal:  Positive for abdominal pain.  ?Neurological:  Positive for headaches.  ?All other systems reviewed and are negative. ? ?Physical Exam ?Updated Vital Signs ?BP (!) 165/75 (BP Location: Right Arm)   Pulse 72   Temp 98.1 ?F (36.7 ?C) (Oral)   Resp (!) 22   Ht 5\' 7"  (1.702 m)   Wt 121.1 kg   SpO2 99%   BMI 41.82 kg/m?  ?Physical Exam ?Vitals and nursing note reviewed.  ?Constitutional:   ?   Appearance: She is well-developed.  ?HENT:  ?   Head: Normocephalic.  ?Eyes:  ?   Extraocular Movements: Extraocular movements intact.  ?   Pupils: Pupils are equal, round, and reactive to light.  ?Cardiovascular:  ?   Rate and Rhythm: Normal rate and regular rhythm.  ?   Heart sounds: Normal heart sounds.  ?Pulmonary:  ?   Effort: Pulmonary effort is normal.  ?   Breath sounds: Normal breath sounds.  ?Abdominal:  ?   General: Abdomen is flat.  ?   Comments: + LLQ tenderness   ?Skin: ?   General: Skin is warm.  ?   Capillary Refill: Capillary refill takes less than 2 seconds.  ?   Comments: Blisters bilateral legs.  No obvious signs of cellulitis  ?Neurological:  ?   General: No focal deficit present.  ?   Mental Status: She is alert and oriented to person, place, and time.  ?Psychiatric:     ?   Mood and Affect: Mood normal.     ?   Behavior: Behavior normal.  ? ? ?ED Results / Procedures / Treatments   ?Labs ?(all labs ordered are listed, but only abnormal results are displayed) ?Labs Reviewed  ?COMPREHENSIVE METABOLIC PANEL - Abnormal; Notable for the following components:  ?    Result Value  ? Potassium 3.3 (*)   ? Glucose, Bld 104 (*)   ? All other components within normal limits  ?RESP PANEL BY RT-PCR (FLU A&B, COVID) ARPGX2  ?LIPASE, BLOOD  ?CBC  ?URINALYSIS, ROUTINE W REFLEX MICROSCOPIC  ? ? ?EKG ?None ? ?Radiology ?No results  found. ? ?Procedures ?Procedures  ? ? ?Medications Ordered in ED ?Medications  ?sodium chloride 0.9 % bolus 1,000 mL (1,000 mLs Intravenous New Bag/Given 06/24/21 1636)  ?ondansetron [facility]) injection 4 mg (4 mg Intravenous Given 06/24/21 1637)  ?morphine (PF) 4 MG/ML injection 4 mg (4 mg Intravenous Given 06/24/21 1637)  ? ? ?ED Course/ Medical Decision Making/ A&P ?  ?                        ?  Medical Decision Making ?HALSEY PERSAUD is a 81 y.o. female here presenting with multiple complaints.  Patient has some headache and nasal congestion.  I think likely migraines but will get COVID test and CT head to rule out COVID versus intracranial mass.  Patient also has abdominal pain.  She has previous abdominal surgeries so we will get a CT abdomen pelvis and labs to rule out SBO.  Finally patient does have some blisters on her legs but they appear to be chronic.  We will give IV fluids and Zofran and pain medicine and reassess ? ?6:26 PM ?Labs unremarkable.  UA is normal.  CT abdomen pelvis showed no diverticulitis or colitis or obstruction.  COVID negative. Patient feeling better after Zofran.  Stable for discharge.  Likely viral gastroenteritis ? ? ?Problems Addressed: ?Left lower quadrant abdominal pain: acute illness or injury ?Nonintractable headache, unspecified chronicity pattern, unspecified headache type: acute illness or injury ? ?Amount and/or Complexity of Data Reviewed ?Labs: ordered. Decision-making details documented in ED Course. ?Radiology: ordered and independent interpretation performed. Decision-making details documented in ED Course. ? ?Risk ?Prescription drug management. ? ?Final Clinical Impression(s) / ED Diagnoses ?Final diagnoses:  ?None  ? ? ?Rx / DC Orders ?ED Discharge Orders   ? ? None  ? ?  ? ? ?  ?Charlynne Pander, MD ?06/24/21 1828 ? ?

## 2021-08-04 ENCOUNTER — Other Ambulatory Visit: Payer: Self-pay

## 2021-08-04 DIAGNOSIS — I10 Essential (primary) hypertension: Secondary | ICD-10-CM

## 2021-08-04 MED ORDER — HYDRALAZINE HCL 50 MG PO TABS: 100.0000 mg | ORAL_TABLET | Freq: Three times a day (TID) | ORAL | 3 refills | Status: AC

## 2021-08-04 MED ORDER — ISOSORBIDE DINITRATE 30 MG PO TABS: 30.0000 mg | ORAL_TABLET | Freq: Three times a day (TID) | ORAL | 3 refills | Status: AC

## 2021-09-11 ENCOUNTER — Ambulatory Visit: Payer: Medicare Other | Admitting: Student

## 2021-12-23 ENCOUNTER — Telehealth: Payer: Self-pay

## 2021-12-23 NOTE — Telephone Encounter (Signed)
Angie Hill, [facility] nurse [phone]  10am: 190/105  11am : took all medications  4pm:    200/110  Patient complains of pain in left arm.  Patient wants to know if you want her to continue taking  her Hydralazine. Please advise.

## 2021-12-24 NOTE — Telephone Encounter (Signed)
Needs to come in with [place] for follow up and may need to enroll her in RPM with Elmo Putt

## 2021-12-25 NOTE — Telephone Encounter (Signed)
Good morning, I have spoken to patient regarding an appointment. I did advise patient that is you wanted her to come sooner for a nurse visit for BP monitoring, that you will give her a call, if not you would see on the 31th during her appointment with Dr Earl Gala

## 2022-01-12 ENCOUNTER — Ambulatory Visit: Payer: Medicare Other | Admitting: Internal Medicine

## 2022-01-19 ENCOUNTER — Encounter: Payer: Self-pay | Admitting: Internal Medicine

## 2022-01-19 ENCOUNTER — Ambulatory Visit: Payer: Medicare Other | Admitting: Internal Medicine

## 2022-01-19 VITALS — BP 167/101 | HR 85 | Ht 67.0 in | Wt 243.2 lb

## 2022-01-19 DIAGNOSIS — I10 Essential (primary) hypertension: Secondary | ICD-10-CM

## 2022-01-19 DIAGNOSIS — E7800 Pure hypercholesterolemia, unspecified: Secondary | ICD-10-CM

## 2022-01-19 DIAGNOSIS — I25118 Atherosclerotic heart disease of native coronary artery with other forms of angina pectoris: Secondary | ICD-10-CM

## 2022-01-19 MED ORDER — DILTIAZEM HCL 30 MG PO TABS: 30.0000 mg | ORAL_TABLET | Freq: Three times a day (TID) | ORAL | 3 refills | Status: AC

## 2022-01-19 MED ORDER — AMLODIPINE BESYLATE 10 MG PO TABS: 10.0000 mg | ORAL_TABLET | Freq: Every day | ORAL | 3 refills | Status: DC

## 2022-01-19 NOTE — Progress Notes (Signed)
Primary Physician:  Cleopatra Cedar, NP   Patient ID: Angie Hill, female    DOB: 1940-11-11, 81 y.o.   MRN: [id number]  Subjective:    Chief Complaint  Patient presents with   Coronary Artery Disease   Follow-up   Hypertension        Palpitations     HPI: Angie Hill  is a 81 y.o. female  with hypertension, CAD s/p LAD PCI 2014, type 2 DM, OSA-not on CPAP, now has been set up for sleep study, chronic palpitations much improved on metoprolol, event monitoring Jan 2020, that revealed no atrial fibrillation, low risk stress test also in March 2019, low normal LVEF by echocardiogram.   Patient presents for a follow-up visit. She has been doing well since the last time she was here but she is experiencing palpitations. She ran out of her amlodipine and her BP has been quite high so this could be contributing. Patient's blood pressure was not controlled even on the amlodipine. Given palpitations and uncontrolled BP, will add Cardizem to be taken with hydralazine/nitrate combo. Patient still enrolled in our blood pressure monitoring program and will continue to check her numbers. Otherwise, she denies chest pain, shortness of breath, diaphoresis, syncope, orthopnea, edema, claudication.   Past Medical History:  Diagnosis Date   Anemia    Angina pectoris (HCC)    Arthritis    Asthma    COPD (chronic obstructive pulmonary disease) (HCC)    Coronary artery disease    Depression    Diabetes mellitus without complication (HCC)    BORDERLINE   GERD (gastroesophageal reflux disease)    H/O hiatal hernia    Hypertension    Shortness of breath    Sleep apnea    Past Surgical History:  Procedure Laterality Date   ABDOMINAL HYSTERECTOMY     partial   CORONARY ANGIOPLASTY WITH STENT PLACEMENT  07/04/2012   mid LAD   LEFT AND RIGHT HEART CATHETERIZATION WITH CORONARY ANGIOGRAM N/A 07/04/2012   Procedure: LEFT AND RIGHT HEART CATHETERIZATION WITH CORONARY ANGIOGRAM;  Surgeon:  Pamella Pert, MD;  Location: [facility] CATH LAB;  Service: Cardiovascular;  Laterality: N/A;   LEFT HEART CATHETERIZATION WITH CORONARY ANGIOGRAM N/A 10/17/2012   Procedure: LEFT HEART CATHETERIZATION WITH CORONARY ANGIOGRAM;  Surgeon: Pamella Pert, MD;  Location: [facility] CATH LAB;  Service: Cardiovascular;  Laterality: N/A;   PARTIAL COLECTOMY  1982   PERCUTANEOUS CORONARY STENT INTERVENTION (PCI-S)  07/04/2012   Procedure: PERCUTANEOUS CORONARY STENT INTERVENTION (PCI-S);  Surgeon: Pamella Pert, MD;  Location: [facility] CATH LAB;  Service: Cardiovascular;;   TUBAL LIGATION     Family History  Problem Relation Age of Onset   Hyperlipidemia Sister    Heart attack Sister    Diabetes Daughter    Stroke Father    Heart attack Father    Stroke Sister    Social History   Tobacco Use   Smoking status: Never   Smokeless tobacco: Never  Substance Use Topics   Alcohol use: No   Marital Status: Single  ROS   Review of Systems  Cardiovascular:  Positive for palpitations. Negative for chest pain, claudication, dyspnea on exertion, leg swelling and syncope.  Respiratory:  Negative for shortness of breath and snoring.   Neurological:  Negative for dizziness.  All other systems reviewed and are negative.   Objective:  Blood pressure (!) 167/101, pulse 85, height 5\' 7"  (1.702 m), weight 243 lb 3.2 oz (110.3 kg).  01/19/2022    1:58 PM 01/19/2022    1:57 PM 06/24/2021    6:33 PM  Vitals with BMI  Height  5\' 7"    Weight  243 lbs 3 oz   BMI  38.08   Systolic 167 178  Diastolic 101 101 85  Pulse 85 88 72   Orthostatic VS for the past 72 hrs (Last 3 readings):  Patient Position BP Location Cuff Size  01/19/22 1358 Sitting Left Arm Large  01/19/22 1357 Sitting Left Arm Large    Physical Exam Vitals reviewed.  HENT:     Head: Normocephalic and atraumatic.  Neck:     Thyroid: No thyromegaly.  Cardiovascular:     Rate and Rhythm: Normal rate and regular rhythm.     Heart sounds:  Normal heart sounds. No murmur heard.    No gallop.  Pulmonary:     Effort: Pulmonary effort is normal.     Breath sounds: Normal breath sounds.  Abdominal:     General: Bowel sounds are normal.  Musculoskeletal:     Right lower leg: No edema.     Left lower leg: No edema.  Skin:    General: Skin is warm and dry.  Neurological:     Mental Status: She is alert.    Laboratory examination:      Latest Ref Rng & Units 06/24/2021    3:00 PM 10/30/2020    8:39 AM 04/25/2018    4:14 PM  CMP  Glucose 70 - 99 mg/dL 06/24/2018  474  74   BUN 8 - 23 mg/dL 17  13  16    Creatinine 0.44 - 1.00 mg/dL 259   5.63   Sodium 135 - 145 mmol/L 141  143  141   Potassium 3.5 - 5.1 mmol/L 3.3  3.8  3.4   Chloride 98 - 111 mmol/L 102  103  104   CO2 22 - 32 mmol/L 29  25  28    Calcium 8.9 - 10.3 mg/dL 9.4  8.75  9.5   Total Protein 6.5 - 8.1 g/dL 7.4   7.1   Total Bilirubin 0.3 - 1.2 mg/dL 0.6   0.9   Alkaline Phos 38 - 126 U/L 38   43   AST 15 - 41 U/L 17   19   ALT 0 - 44 U/L 15   23       Latest Ref Rng & Units 06/24/2021    3:00 PM 10/30/2020    8:39 AM 04/25/2018    4:14 PM  CBC  WBC 4.0 - 10.5 K/uL 8.0  7.9  10.6   Hemoglobin 12.0 - 15.0 g/dL 08/24/2021  11/01/2020  06/24/2018   Hematocrit 36.0 - 46.0 % 38.2  39.3  39.4   Platelets 150 - 400 K/uL 361  332  345    Lipid Panel     Component Value Date/Time   CHOL 243 (H) 10/30/2020 0839   TRIG 153 (H) 10/30/2020 0839   HDL 49 10/30/2020 0839   LDLCALC 166 (H) 10/30/2020 0839     HEMOGLOBIN A1C Lab Results  Component Value Date   HGBA1C 6.1 (H) 10/30/2020   TSH No results for input(s): "TSH" in the last 8760 hours.   External labs: 07/26/2018: HDL 40, LDL 112, total cholesterol 135, triglycerides 77 A1c 5.8% TSH 1.127  Allergies   Allergies  Allergen Reactions   Budesonide-Formoterol Fumarate Anaphylaxis   Doxycycline     Other reaction(s): Other (See Comments) unknown  Ciprofloxacin Itching and Rash   Erythromycin Itching and Rash     Medications Prior to Visit:   Outpatient Medications Prior to Visit  Medication Sig Dispense Refill   albuterol (PROVENTIL HFA;VENTOLIN HFA) 108 (90 Base) MCG/ACT inhaler Inhale 2 puffs into the lungs 2 (two) times daily as needed for wheezing or shortness of breath. 1 Inhaler 0   allopurinol (ZYLOPRIM) 300 MG tablet Take 300 mg by mouth as needed.      aspirin EC 81 MG tablet Take 81 mg by mouth daily.     atorvastatin (LIPITOR) 80 MG tablet Take 80 mg by mouth at bedtime.     b complex vitamins tablet Take 1 tablet by mouth daily.     BREZTRI AEROSPHERE 160-9-4.8 MCG/ACT AERO Inhale 1 puff into the lungs in the morning and at bedtime.     budesonide-formoterol (SYMBICORT) 160-4.5 MCG/ACT inhaler Inhale 2 puffs into the lungs daily.     cetirizine (ZYRTEC) 10 MG chewable tablet Chew 10 mg by mouth daily.     furosemide (LASIX) 40 MG tablet Take 20 mg by mouth daily as needed for edema. For leg swelling     gabapentin (NEURONTIN) 600 MG tablet Take 600 mg by mouth daily.     hydrALAZINE (APRESOLINE) 50 MG tablet Take 2 tablets (100 mg total) by mouth 3 (three) times daily. 270 tablet 3   isosorbide dinitrate (ISORDIL) 30 MG tablet Take 1 tablet (30 mg total) by mouth 3 (three) times daily. 270 tablet 3   JANUVIA 25 MG tablet Take 25 mg by mouth daily.     losartan (COZAAR) 100 MG tablet Take 1 tablet (100 mg total) by mouth daily. 90 tablet 3   metoprolol (TOPROL-XL) 200 MG 24 hr tablet Take 1 tablet (200 mg total) by mouth daily. 90 tablet 3   montelukast (SINGULAIR) 10 MG tablet Take 10 mg by mouth daily.     nitroGLYCERIN (NITROSTAT) 0.4 MG SL tablet Place 1 tablet (0.4 mg total) under the tongue every 5 (five) minutes as needed for chest pain. 25 tablet 3   pantoprazole (PROTONIX) 40 MG tablet Take 40 mg by mouth daily.      PROAIR HFA 108 (90 Base) MCG/ACT inhaler as needed.     sertraline (ZOLOFT) 25 MG tablet Take 25 mg by mouth daily.     traMADol (ULTRAM) 50 MG tablet Take 50 mg  by mouth as needed.     traZODone (DESYREL) 50 MG tablet Take 50 mg by mouth at bedtime.     amLODipine (NORVASC) 10 MG tablet Take 1 tablet (10 mg total) by mouth daily at 12 noon. At noon 90 tablet 3   LINZESS 72 MCG capsule Take 72 mcg by mouth every morning.     dicyclomine (BENTYL) 20 MG tablet Take 1 tablet (20 mg total) by mouth 2 (two) times daily as needed for spasms. 20 tablet 0   ondansetron (ZOFRAN) 4 MG tablet Take 1 tablet (4 mg total) by mouth every 6 (six) hours. 12 tablet 0   promethazine (PHENERGAN) 25 MG tablet Unable to verify     No facility-administered medications prior to visit.   Final Medications at End of Visit    Current Meds  Medication Sig   albuterol (PROVENTIL HFA;VENTOLIN HFA) 108 (90 Base) MCG/ACT inhaler Inhale 2 puffs into the lungs 2 (two) times daily as needed for wheezing or shortness of breath.   allopurinol (ZYLOPRIM) 300 MG tablet Take 300 mg by mouth as needed.  amLODipine (NORVASC) 10 MG tablet Take 1 tablet (10 mg total) by mouth daily.   aspirin EC 81 MG tablet Take 81 mg by mouth daily.   atorvastatin (LIPITOR) 80 MG tablet Take 80 mg by mouth at bedtime.   b complex vitamins tablet Take 1 tablet by mouth daily.   BREZTRI AEROSPHERE 160-9-4.8 MCG/ACT AERO Inhale 1 puff into the lungs in the morning and at bedtime.   budesonide-formoterol (SYMBICORT) 160-4.5 MCG/ACT inhaler Inhale 2 puffs into the lungs daily.   cetirizine (ZYRTEC) 10 MG chewable tablet Chew 10 mg by mouth daily.   furosemide (LASIX) 40 MG tablet Take 20 mg by mouth daily as needed for edema. For leg swelling   gabapentin (NEURONTIN) 600 MG tablet Take 600 mg by mouth daily.   hydrALAZINE (APRESOLINE) 50 MG tablet Take 2 tablets (100 mg total) by mouth 3 (three) times daily.   isosorbide dinitrate (ISORDIL) 30 MG tablet Take 1 tablet (30 mg total) by mouth 3 (three) times daily.   JANUVIA 25 MG tablet Take 25 mg by mouth daily.   losartan (COZAAR) 100 MG tablet Take 1 tablet  (100 mg total) by mouth daily.   metoprolol (TOPROL-XL) 200 MG 24 hr tablet Take 1 tablet (200 mg total) by mouth daily.   montelukast (SINGULAIR) 10 MG tablet Take 10 mg by mouth daily.   nitroGLYCERIN (NITROSTAT) 0.4 MG SL tablet Place 1 tablet (0.4 mg total) under the tongue every 5 (five) minutes as needed for chest pain.   pantoprazole (PROTONIX) 40 MG tablet Take 40 mg by mouth daily.    PROAIR HFA 108 (90 Base) MCG/ACT inhaler as needed.   sertraline (ZOLOFT) 25 MG tablet Take 25 mg by mouth daily.   traMADol (ULTRAM) 50 MG tablet Take 50 mg by mouth as needed.   traZODone (DESYREL) 50 MG tablet Take 50 mg by mouth at bedtime.   Radiology:   No results found.  Cardiac Studies:   Coronary angiogram 2014: Patent pLAD stent (4.0X22 mm Integrity BMS) Mid LAD mod stenosis. FFR 0.86 (Nonsignificant) LCx: Ostial 20-30% stenosis. Ostial AV grove 30% stenosis RCA: Mid tandem 20-30% stenosis and 40-50% stenoses.  48 hour holter monitor 04/07/2018:  Normal sinus rhythm. Min HR 60 and Max HR 103. Occasional PVC (4.3% burden) and rare PAC (<1% burden). No A fib or SVT noted. No heart block.    Lexiscan myoview stress test 06/06/2017:  1. Pharmacologic stress testing was performed with intravenous administration of .4 mg of Lexiscan over a 10-15 seconds infusion. Stress symptoms included chest tightness, dyspnea, dizziness, headache. Normal blood pressure.  Exercise capacity not assessed. Stress EKG is non diagnostic for ischemia as it is a pharmacologic stress.  2. The overall quality of the study is fair. Review of the raw data in a rotational cine format reveals breast attenuation with imaging performed in sitting position. Attenuation is most pronounced on rest images.  Left ventricular cavity is noted to be normal on the rest and stress studies. Gated SPECT images reveal normal myocardial thickening and wall motion.  The left ventricular ejection fraction was calculated or visually estimated  to be 65%.  SPECT rest images demonstrate a large area of mild intensity perfusion defect in basal to apical inferior, inferoseptal and apical inferolateral myocardial walls.  Stress SPECT images demonstrate only a small area of mild intensity perfusion defect in apical inferior inferolateral myocardial walls. While perfusion defect on rest images likely represent breast attenuation, perfusion defect, perfusion defect on stress images  could represent small area of mild ischemia.  3. Low risk study.   Lower Extremity Arterial Duplex 05/22/2020:  No hemodynamically significant stenoses are identified in the right or  left lower extremity arterial system.  This exam reveals normal perfusion of the right and left lower extremity  (Rt. ABI 1.14 and Lt. ABI 1.20).  Multiphasic normal waveforms noted at the ankles. Mild heterogeneous  plaque in bilateral lower extremity vessels.  PCV ECHOCARDIOGRAM COMPLETE 11/20/2020 Normal LV systolic function with visual EF 60-65%. Left ventricle cavity is normal in size. Moderate left ventricular hypertrophy. Normal global wall motion. Normal diastolic filling pattern, normal LAP. Mild (Grade I) mitral regurgitation. Compared to study 04/12/2018 no significant change.   EKG   01/19/2022: Sinus rhythm with first-degree AV block, LVH, no ischemia  05/21/2021: Sinus rhythm with first-degree AV block at a rate of 66 bpm.  Normal axis.  LVH.  Compared EKG 10/23/2020, no PRWP.  EKG 02/19/2019: Sinus rhythm with first-degree block at rate of 86 bpm, normal axis.  No evidence of ischemia.  Voltage criteria for LVH.compared to 04/07/2018, frequent PACs not present.   Assessment:     ICD-10-CM   1. Coronary artery disease of native artery of native heart with stable angina pectoris (HCC)  I25.118 EKG 12-Lead    2. Hypercholesterolemia  E78.00     3. Essential hypertension  I10       Medications Discontinued During This Encounter  Medication Reason   dicyclomine  (BENTYL) 20 MG tablet    ondansetron (ZOFRAN) 4 MG tablet    promethazine (PHENERGAN) 25 MG tablet    Meds ordered this encounter  Medications   amLODipine (NORVASC) 10 MG tablet    Sig: Take 1 tablet (10 mg total) by mouth daily.    Dispense:  90 tablet    Refill:  3   Recommendations:   DAELYNN BLOWER  is a 81 y.o.  female  with hypertension, CAD s/p LAD PCI 2014, type 2 DM, OSA-not on CPAP, chronic palpitations much improved on metoprolol, event monitoring January 2020, that revealed no atrial fibrillation, low risk stress test also in March 2019, low normal LVEF by echocardiogram.   Coronary artery disease of native artery of native heart with stable angina pectoris (HCC) Continue current medications No anginal symptoms, will not repeat any imaging this visit   Hypercholesterolemia Continue statin   Essential hypertension BP uncontrolled, she ran out of amlodipine - resent to pharmacy Patient complaining of palpitations from Hydralazine/nitrate combo so will send Cardizem to take with it Patient in RPM monitoring program and pressures are not controlled   Follow-up in 6 months, sooner if needed.   Clotilde Dieter, DO, [facility] 01/19/2022, 2:10 PM Office: [phone]

## 2022-04-12 DIAGNOSIS — W19XXXA Unspecified fall, initial encounter: Secondary | ICD-10-CM | POA: Insufficient documentation

## 2022-04-12 DIAGNOSIS — S72331A Displaced oblique fracture of shaft of right femur, initial encounter for closed fracture: Secondary | ICD-10-CM | POA: Insufficient documentation

## 2022-07-20 ENCOUNTER — Ambulatory Visit: Payer: Self-pay | Admitting: Internal Medicine

## 2022-07-20 NOTE — Progress Notes (Signed)
No show

## 2022-10-27 NOTE — Telephone Encounter (Signed)
done

## 2023-02-23 ENCOUNTER — Other Ambulatory Visit: Payer: Self-pay

## 2023-02-23 DIAGNOSIS — I10 Essential (primary) hypertension: Secondary | ICD-10-CM | POA: Insufficient documentation

## 2023-02-23 DIAGNOSIS — Z8719 Personal history of other diseases of the digestive system: Secondary | ICD-10-CM | POA: Insufficient documentation

## 2023-02-23 DIAGNOSIS — R0602 Shortness of breath: Secondary | ICD-10-CM | POA: Insufficient documentation

## 2023-02-23 DIAGNOSIS — F32A Depression, unspecified: Secondary | ICD-10-CM | POA: Insufficient documentation

## 2023-02-23 DIAGNOSIS — E119 Type 2 diabetes mellitus without complications: Secondary | ICD-10-CM | POA: Insufficient documentation

## 2023-02-23 DIAGNOSIS — M1990 Unspecified osteoarthritis, unspecified site: Secondary | ICD-10-CM | POA: Insufficient documentation

## 2023-02-23 DIAGNOSIS — J45909 Unspecified asthma, uncomplicated: Secondary | ICD-10-CM | POA: Insufficient documentation

## 2023-02-23 DIAGNOSIS — J449 Chronic obstructive pulmonary disease, unspecified: Secondary | ICD-10-CM | POA: Insufficient documentation

## 2023-02-23 DIAGNOSIS — I2510 Atherosclerotic heart disease of native coronary artery without angina pectoris: Secondary | ICD-10-CM | POA: Insufficient documentation

## 2023-02-23 DIAGNOSIS — D649 Anemia, unspecified: Secondary | ICD-10-CM | POA: Insufficient documentation

## 2023-02-23 DIAGNOSIS — I209 Angina pectoris, unspecified: Secondary | ICD-10-CM | POA: Insufficient documentation

## 2023-02-23 DIAGNOSIS — I25118 Atherosclerotic heart disease of native coronary artery with other forms of angina pectoris: Secondary | ICD-10-CM | POA: Insufficient documentation

## 2023-02-23 DIAGNOSIS — Z7409 Other reduced mobility: Secondary | ICD-10-CM | POA: Insufficient documentation

## 2023-02-23 DIAGNOSIS — K219 Gastro-esophageal reflux disease without esophagitis: Secondary | ICD-10-CM | POA: Insufficient documentation

## 2023-02-24 ENCOUNTER — Ambulatory Visit: Payer: Medicare Other | Admitting: Cardiology

## 2023-02-24 ENCOUNTER — Ambulatory Visit: Payer: 59 | Attending: Cardiology | Admitting: Cardiology

## 2023-04-11 ENCOUNTER — Other Ambulatory Visit: Payer: Self-pay | Admitting: Internal Medicine
